# Patient Record
Sex: Female | Born: 1945 | Race: White | Hispanic: No | Marital: Married | Smoking: Former smoker
Health system: Southern US, Community
[De-identification: ages and names within clinical notes are randomized; demographics above are authoritative.]

## PROBLEM LIST (undated history)

## (undated) DIAGNOSIS — Z9221 Personal history of antineoplastic chemotherapy: Secondary | ICD-10-CM

## (undated) DIAGNOSIS — Z803 Family history of malignant neoplasm of breast: Secondary | ICD-10-CM

## (undated) DIAGNOSIS — R569 Unspecified convulsions: Secondary | ICD-10-CM

## (undated) DIAGNOSIS — Z9889 Other specified postprocedural states: Secondary | ICD-10-CM

## (undated) DIAGNOSIS — Z8041 Family history of malignant neoplasm of ovary: Secondary | ICD-10-CM

## (undated) DIAGNOSIS — M419 Scoliosis, unspecified: Secondary | ICD-10-CM

## (undated) DIAGNOSIS — E785 Hyperlipidemia, unspecified: Secondary | ICD-10-CM

## (undated) DIAGNOSIS — C50911 Malignant neoplasm of unspecified site of right female breast: Secondary | ICD-10-CM

## (undated) DIAGNOSIS — Z9289 Personal history of other medical treatment: Secondary | ICD-10-CM

## (undated) DIAGNOSIS — R112 Nausea with vomiting, unspecified: Secondary | ICD-10-CM

## (undated) DIAGNOSIS — I499 Cardiac arrhythmia, unspecified: Secondary | ICD-10-CM

## (undated) DIAGNOSIS — M810 Age-related osteoporosis without current pathological fracture: Secondary | ICD-10-CM

## (undated) DIAGNOSIS — IMO0001 Reserved for inherently not codable concepts without codable children: Secondary | ICD-10-CM

## (undated) DIAGNOSIS — D72829 Elevated white blood cell count, unspecified: Secondary | ICD-10-CM

## (undated) DIAGNOSIS — C50919 Malignant neoplasm of unspecified site of unspecified female breast: Secondary | ICD-10-CM

## (undated) DIAGNOSIS — K219 Gastro-esophageal reflux disease without esophagitis: Secondary | ICD-10-CM

## (undated) DIAGNOSIS — M1990 Unspecified osteoarthritis, unspecified site: Secondary | ICD-10-CM

## (undated) DIAGNOSIS — M8580 Other specified disorders of bone density and structure, unspecified site: Secondary | ICD-10-CM

## (undated) DIAGNOSIS — M89669 Osteopathy after poliomyelitis, unspecified lower leg: Secondary | ICD-10-CM

## (undated) DIAGNOSIS — D72820 Lymphocytosis (symptomatic): Secondary | ICD-10-CM

## (undated) DIAGNOSIS — C50319 Malignant neoplasm of lower-inner quadrant of unspecified female breast: Secondary | ICD-10-CM

## (undated) DIAGNOSIS — A809 Acute poliomyelitis, unspecified: Secondary | ICD-10-CM

## (undated) HISTORY — PX: TUBAL LIGATION: SHX77

## (undated) HISTORY — DX: Malignant neoplasm of lower-inner quadrant of unspecified female breast: C50.319

## (undated) HISTORY — DX: Lymphocytosis (symptomatic): D72.820

## (undated) HISTORY — PX: SPINE SURGERY: SHX786

## (undated) HISTORY — DX: Osteopathy after poliomyelitis, unspecified lower leg: M89.669

## (undated) HISTORY — DX: Elevated white blood cell count, unspecified: D72.829

## (undated) HISTORY — DX: Family history of malignant neoplasm of ovary: Z80.41

## (undated) HISTORY — DX: Gastro-esophageal reflux disease without esophagitis: K21.9

## (undated) HISTORY — DX: Other specified disorders of bone density and structure, unspecified site: M85.80

## (undated) HISTORY — DX: Hyperlipidemia, unspecified: E78.5

## (undated) HISTORY — PX: BACK SURGERY: SHX140

## (undated) HISTORY — DX: Malignant neoplasm of unspecified site of right female breast: C50.911

## (undated) HISTORY — PX: MASTECTOMY: SHX3

## (undated) HISTORY — DX: Unspecified osteoarthritis, unspecified site: M19.90

## (undated) HISTORY — DX: Family history of malignant neoplasm of breast: Z80.3

## (undated) HISTORY — PX: BREAST BIOPSY: SHX20

## (undated) HISTORY — PX: LEG SURGERY: SHX1003

---

## 1983-08-10 HISTORY — PX: ABDOMINAL HYSTERECTOMY: SHX81

## 1986-10-19 HISTORY — PX: FOOT SURGERY: SHX648

## 2004-09-22 ENCOUNTER — Ambulatory Visit: Payer: Self-pay | Admitting: Family Medicine

## 2005-03-17 ENCOUNTER — Ambulatory Visit: Payer: Self-pay | Admitting: Family Medicine

## 2005-04-28 ENCOUNTER — Ambulatory Visit: Payer: Self-pay | Admitting: Gastroenterology

## 2005-12-22 ENCOUNTER — Ambulatory Visit: Payer: Self-pay | Admitting: Gastroenterology

## 2006-03-25 ENCOUNTER — Ambulatory Visit: Payer: Self-pay | Admitting: Family Medicine

## 2007-03-29 ENCOUNTER — Ambulatory Visit: Payer: Self-pay | Admitting: Family Medicine

## 2007-03-30 ENCOUNTER — Ambulatory Visit: Payer: Self-pay | Admitting: Family Medicine

## 2007-04-25 ENCOUNTER — Ambulatory Visit: Payer: Self-pay | Admitting: General Surgery

## 2007-09-23 ENCOUNTER — Ambulatory Visit: Payer: Self-pay | Admitting: General Surgery

## 2008-04-18 ENCOUNTER — Ambulatory Visit: Payer: Self-pay | Admitting: Family Medicine

## 2008-05-01 ENCOUNTER — Ambulatory Visit: Payer: Self-pay | Admitting: Family Medicine

## 2009-05-20 ENCOUNTER — Ambulatory Visit: Payer: Self-pay | Admitting: Family Medicine

## 2009-10-19 HISTORY — PX: COLONOSCOPY: SHX174

## 2009-11-26 ENCOUNTER — Ambulatory Visit: Payer: Self-pay | Admitting: Family Medicine

## 2009-12-30 ENCOUNTER — Encounter: Payer: Self-pay | Admitting: Neurology

## 2010-01-17 ENCOUNTER — Encounter: Payer: Self-pay | Admitting: Neurology

## 2010-05-23 ENCOUNTER — Ambulatory Visit: Payer: Self-pay | Admitting: Family Medicine

## 2010-06-02 ENCOUNTER — Ambulatory Visit: Payer: Self-pay | Admitting: Family Medicine

## 2010-06-11 ENCOUNTER — Ambulatory Visit: Payer: Self-pay | Admitting: Gastroenterology

## 2010-06-18 ENCOUNTER — Ambulatory Visit: Payer: Self-pay | Admitting: Gastroenterology

## 2010-07-01 ENCOUNTER — Ambulatory Visit: Payer: Self-pay | Admitting: Family Medicine

## 2011-04-19 ENCOUNTER — Ambulatory Visit: Payer: Self-pay | Admitting: Internal Medicine

## 2011-04-27 ENCOUNTER — Emergency Department: Payer: Self-pay | Admitting: Emergency Medicine

## 2011-05-22 ENCOUNTER — Ambulatory Visit: Payer: Self-pay | Admitting: Internal Medicine

## 2011-06-20 ENCOUNTER — Ambulatory Visit: Payer: Self-pay | Admitting: Internal Medicine

## 2011-07-08 ENCOUNTER — Ambulatory Visit: Payer: Self-pay | Admitting: Family Medicine

## 2011-09-14 ENCOUNTER — Ambulatory Visit: Payer: Self-pay | Admitting: Internal Medicine

## 2011-09-19 ENCOUNTER — Ambulatory Visit: Payer: Self-pay | Admitting: Internal Medicine

## 2012-01-06 ENCOUNTER — Ambulatory Visit: Payer: Self-pay | Admitting: Internal Medicine

## 2012-01-06 LAB — CBC CANCER CENTER
Basophil %: 0.7 %
Eosinophil #: 0.4 x10 3/mm (ref 0.0–0.7)
Eosinophil %: 4.2 %
HGB: 12.3 g/dL (ref 12.0–16.0)
Lymphocyte %: 50.1 %
MCH: 30.4 pg (ref 26.0–34.0)
MCHC: 33.2 g/dL (ref 32.0–36.0)
MCV: 92 fL (ref 80–100)
Monocyte #: 0.8 x10 3/mm — ABNORMAL HIGH (ref 0.0–0.7)
Neutrophil #: 4 x10 3/mm (ref 1.4–6.5)
Neutrophil %: 37.5 %
RBC: 4.05 10*6/uL (ref 3.80–5.20)
WBC: 10.6 x10 3/mm (ref 3.6–11.0)

## 2012-01-06 LAB — CREATININE, SERUM
Creatinine: 0.71 mg/dL (ref 0.60–1.30)
EGFR (Non-African Amer.): >60

## 2012-01-18 ENCOUNTER — Ambulatory Visit: Payer: Self-pay | Admitting: Internal Medicine

## 2012-06-03 ENCOUNTER — Ambulatory Visit: Payer: Self-pay | Admitting: Internal Medicine

## 2012-06-03 LAB — CBC CANCER CENTER
Basophil: 1 %
Comment - H1-Com1: NORMAL
Eosinophil: 2 %
HCT: 37 % (ref 35.0–47.0)
HGB: 11.9 g/dL — ABNORMAL LOW (ref 12.0–16.0)
Lymphocytes: 53 %
MCV: 92 fL (ref 80–100)
Monocytes: 3 %
RDW: 13.5 % (ref 11.5–14.5)
Segmented Neutrophils: 34 %
Variant Lymphocyte: 6 %
WBC: 10.9 x10 3/mm (ref 3.6–11.0)

## 2012-06-19 ENCOUNTER — Ambulatory Visit: Payer: Self-pay | Admitting: Internal Medicine

## 2012-07-08 ENCOUNTER — Ambulatory Visit: Payer: Self-pay | Admitting: Family Medicine

## 2012-09-18 DIAGNOSIS — C50911 Malignant neoplasm of unspecified site of right female breast: Secondary | ICD-10-CM

## 2012-09-18 HISTORY — DX: Malignant neoplasm of unspecified site of right female breast: C50.911

## 2012-09-29 ENCOUNTER — Ambulatory Visit: Payer: Self-pay | Admitting: General Surgery

## 2012-10-03 ENCOUNTER — Other Ambulatory Visit: Payer: Self-pay | Admitting: General Surgery

## 2012-10-03 DIAGNOSIS — C50911 Malignant neoplasm of unspecified site of right female breast: Secondary | ICD-10-CM

## 2012-10-04 ENCOUNTER — Ambulatory Visit: Payer: Self-pay | Admitting: General Surgery

## 2012-10-17 ENCOUNTER — Ambulatory Visit
Admission: RE | Admit: 2012-10-17 | Discharge: 2012-10-17 | Disposition: A | Discharge status: Home / Self Care | Payer: Medicare Other | Source: Ambulatory Visit | Attending: General Surgery | Admitting: General Surgery

## 2012-10-17 DIAGNOSIS — C50911 Malignant neoplasm of unspecified site of right female breast: Secondary | ICD-10-CM

## 2012-10-17 MED ORDER — GADOBENATE DIMEGLUMINE 529 MG/ML IV SOLN
17.0000 mL | Freq: Once | INTRAVENOUS | Status: AC | PRN
  Administered 2012-10-17: 17 mL via INTRAVENOUS

## 2012-10-19 HISTORY — PX: PORTACATH PLACEMENT: SHX2246

## 2012-10-19 HISTORY — PX: BREAST SURGERY: SHX581

## 2012-10-27 ENCOUNTER — Ambulatory Visit: Payer: Self-pay | Admitting: General Surgery

## 2012-11-03 ENCOUNTER — Ambulatory Visit: Payer: Self-pay | Admitting: General Surgery

## 2012-11-09 LAB — PATHOLOGY REPORT

## 2012-11-28 ENCOUNTER — Ambulatory Visit: Payer: Self-pay | Admitting: Internal Medicine

## 2012-12-12 ENCOUNTER — Ambulatory Visit: Payer: Self-pay | Admitting: Internal Medicine

## 2012-12-17 ENCOUNTER — Ambulatory Visit: Payer: Self-pay | Admitting: Internal Medicine

## 2012-12-22 ENCOUNTER — Ambulatory Visit: Payer: Self-pay | Admitting: General Surgery

## 2012-12-26 LAB — COMPREHENSIVE METABOLIC PANEL
BUN: 12 mg/dL (ref 7–18)
Chloride: 102 mmol/L (ref 98–107)
Co2: 29 mmol/L (ref 21–32)
Creatinine: 0.78 mg/dL (ref 0.60–1.30)
EGFR (African American): >60
Glucose: 118 mg/dL — ABNORMAL HIGH (ref 65–99)
Osmolality: 280 (ref 275–301)
Potassium: 3.8 mmol/L (ref 3.5–5.1)
SGOT(AST): 22 U/L (ref 15–37)
SGPT (ALT): 22 U/L (ref 12–78)
Sodium: 140 mmol/L (ref 136–145)

## 2012-12-26 LAB — CBC CANCER CENTER
Eosinophil #: 0.4 x10 3/mm (ref 0.0–0.7)
HCT: 38.7 % (ref 35.0–47.0)
Lymphocyte #: 5.4 x10 3/mm — ABNORMAL HIGH (ref 1.0–3.6)
Lymphocyte %: 48.2 %
MCV: 90 fL (ref 80–100)
Monocyte %: 7.6 %
Neutrophil %: 39.5 %
Platelet: 317 x10 3/mm (ref 150–440)
RBC: 4.3 10*6/uL (ref 3.80–5.20)
RDW: 13.3 % (ref 11.5–14.5)
WBC: 11.2 x10 3/mm — ABNORMAL HIGH (ref 3.6–11.0)

## 2013-01-02 LAB — CBC CANCER CENTER
Basophil %: 1.3 %
Eosinophil #: 0.1 x10 3/mm (ref 0.0–0.7)
Eosinophil %: 7.3 %
Lymphocyte #: 1.8 x10 3/mm (ref 1.0–3.6)
MCH: 30.1 pg (ref 26.0–34.0)
MCHC: 33.5 g/dL (ref 32.0–36.0)
MCV: 90 fL (ref 80–100)
Monocyte #: 0 x10 3/mm — ABNORMAL LOW (ref 0.2–0.9)
Neutrophil #: 0 x10 3/mm — ABNORMAL LOW (ref 1.4–6.5)
Platelet: 115 x10 3/mm — ABNORMAL LOW (ref 150–440)
RBC: 4.02 10*6/uL (ref 3.80–5.20)
WBC: 2 x10 3/mm — CL (ref 3.6–11.0)

## 2013-01-09 LAB — HEPATIC FUNCTION PANEL A (ARMC)
Albumin: 2.9 g/dL — ABNORMAL LOW (ref 3.4–5.0)
Alkaline Phosphatase: 87 U/L (ref 50–136)
Bilirubin, Direct: <0.05 mg/dL (ref 0.00–0.20)
SGOT(AST): 20 U/L (ref 15–37)
Total Protein: 6.6 g/dL (ref 6.4–8.2)

## 2013-01-09 LAB — CBC CANCER CENTER
Basophil #: 0.1 x10 3/mm (ref 0.0–0.1)
Eosinophil #: 0 x10 3/mm (ref 0.0–0.7)
Eosinophil %: 0.2 %
HCT: 34.3 % — ABNORMAL LOW (ref 35.0–47.0)
Lymphocyte #: 4.4 x10 3/mm — ABNORMAL HIGH (ref 1.0–3.6)
MCH: 29.9 pg (ref 26.0–34.0)
Monocyte %: 12.1 %
Neutrophil %: 59.2 %
RDW: 13.1 % (ref 11.5–14.5)
WBC: 15.5 x10 3/mm — ABNORMAL HIGH (ref 3.6–11.0)

## 2013-01-09 LAB — BASIC METABOLIC PANEL
Anion Gap: 5 — ABNORMAL LOW (ref 7–16)
Co2: 30 mmol/L (ref 21–32)
Creatinine: 0.71 mg/dL (ref 0.60–1.30)
Sodium: 139 mmol/L (ref 136–145)

## 2013-01-16 LAB — CBC CANCER CENTER
Basophil #: 0.2 x10 3/mm — ABNORMAL HIGH (ref 0.0–0.1)
Eosinophil #: 0 x10 3/mm (ref 0.0–0.7)
Eosinophil %: 0.2 %
HCT: 34.2 % — ABNORMAL LOW (ref 35.0–47.0)
HGB: 11.6 g/dL — ABNORMAL LOW (ref 12.0–16.0)
Lymphocyte #: 3.2 x10 3/mm (ref 1.0–3.6)
Lymphocyte %: 33.9 %
MCHC: 33.9 g/dL (ref 32.0–36.0)
MCV: 89 fL (ref 80–100)
Monocyte #: 1.2 x10 3/mm — ABNORMAL HIGH (ref 0.2–0.9)
Monocyte %: 12.9 %
Neutrophil #: 4.9 x10 3/mm (ref 1.4–6.5)
RBC: 3.85 10*6/uL (ref 3.80–5.20)

## 2013-01-17 ENCOUNTER — Ambulatory Visit: Payer: Self-pay | Admitting: Internal Medicine

## 2013-01-23 LAB — CBC CANCER CENTER
Basophil #: 0 x10 3/mm (ref 0.0–0.1)
Basophil %: 0.1 %
Eosinophil #: 0.1 x10 3/mm (ref 0.0–0.7)
Eosinophil %: 4.5 %
HCT: 31.4 % — ABNORMAL LOW (ref 35.0–47.0)
HGB: 10.4 g/dL — ABNORMAL LOW (ref 12.0–16.0)
Lymphocyte #: 1.3 x10 3/mm (ref 1.0–3.6)
Lymphocyte %: 62.1 %
MCH: 29.6 pg (ref 26.0–34.0)
MCHC: 33.2 g/dL (ref 32.0–36.0)
MCV: 89 fL (ref 80–100)
Monocyte #: 0.1 x10 3/mm — ABNORMAL LOW (ref 0.2–0.9)
Monocyte %: 3.4 %
Platelet: 243 x10 3/mm (ref 150–440)
RBC: 3.51 10*6/uL — ABNORMAL LOW (ref 3.80–5.20)
RDW: 13.4 % (ref 11.5–14.5)

## 2013-01-30 LAB — CBC CANCER CENTER
Basophil #: 0.1 x10 3/mm (ref 0.0–0.1)
HCT: 31 % — ABNORMAL LOW (ref 35.0–47.0)
HGB: 10.3 g/dL — ABNORMAL LOW (ref 12.0–16.0)
Lymphocyte #: 3.3 x10 3/mm (ref 1.0–3.6)
Lymphocyte %: 36.1 %
MCHC: 33.3 g/dL (ref 32.0–36.0)
MCV: 90 fL (ref 80–100)
Monocyte #: 1.4 x10 3/mm — ABNORMAL HIGH (ref 0.2–0.9)
Monocyte %: 15.3 %
Neutrophil #: 3.9 x10 3/mm (ref 1.4–6.5)
Neutrophil %: 42.7 %
RBC: 3.47 10*6/uL — ABNORMAL LOW (ref 3.80–5.20)

## 2013-01-30 LAB — HEPATIC FUNCTION PANEL A (ARMC)
Albumin: 2.8 g/dL — ABNORMAL LOW (ref 3.4–5.0)
Alkaline Phosphatase: 88 U/L (ref 50–136)
Bilirubin, Direct: <0.05 mg/dL (ref 0.00–0.20)
Bilirubin,Total: 0.1 mg/dL — ABNORMAL LOW (ref 0.2–1.0)
SGPT (ALT): 21 U/L (ref 12–78)

## 2013-01-30 LAB — BASIC METABOLIC PANEL
BUN: 5 mg/dL — ABNORMAL LOW (ref 7–18)
Calcium, Total: 8.9 mg/dL (ref 8.5–10.1)
Co2: 32 mmol/L (ref 21–32)
Creatinine: 0.69 mg/dL (ref 0.60–1.30)
EGFR (African American): >60
EGFR (Non-African Amer.): >60
Osmolality: 282 (ref 275–301)
Potassium: 4.2 mmol/L (ref 3.5–5.1)

## 2013-02-06 LAB — CBC CANCER CENTER
Basophil #: 0.1 x10 3/mm (ref 0.0–0.1)
HCT: 30.7 % — ABNORMAL LOW (ref 35.0–47.0)
MCHC: 33.8 g/dL (ref 32.0–36.0)
Monocyte %: 21.2 %
Neutrophil #: 3 x10 3/mm (ref 1.4–6.5)
Platelet: 365 x10 3/mm (ref 150–440)
RBC: 3.41 10*6/uL — ABNORMAL LOW (ref 3.80–5.20)
RDW: 14 % (ref 11.5–14.5)

## 2013-02-13 LAB — CBC CANCER CENTER
Basophil %: 3.9 %
Eosinophil #: 0.1 x10 3/mm (ref 0.0–0.7)
HGB: 9.1 g/dL — ABNORMAL LOW (ref 12.0–16.0)
Lymphocyte #: 1 x10 3/mm (ref 1.0–3.6)
MCHC: 33.2 g/dL (ref 32.0–36.0)
Monocyte %: 4 %
Neutrophil #: 0.2 x10 3/mm — ABNORMAL LOW (ref 1.4–6.5)
Neutrophil %: 17.2 %
RBC: 3.04 10*6/uL — ABNORMAL LOW (ref 3.80–5.20)

## 2013-02-16 ENCOUNTER — Ambulatory Visit: Payer: Self-pay | Admitting: Internal Medicine

## 2013-02-20 LAB — HEPATIC FUNCTION PANEL A (ARMC)
Alkaline Phosphatase: 91 U/L (ref 50–136)
Bilirubin,Total: 0.2 mg/dL (ref 0.2–1.0)
SGOT(AST): 19 U/L (ref 15–37)
Total Protein: 6.2 g/dL — ABNORMAL LOW (ref 6.4–8.2)

## 2013-02-20 LAB — CBC CANCER CENTER
Basophil #: 0 x10 3/mm (ref 0.0–0.1)
Basophil %: 0.7 %
HCT: 26.5 % — ABNORMAL LOW (ref 35.0–47.0)
HGB: 8.8 g/dL — ABNORMAL LOW (ref 12.0–16.0)
Lymphocyte %: 34.9 %
MCH: 30.1 pg (ref 26.0–34.0)
MCHC: 33.2 g/dL (ref 32.0–36.0)
MCV: 91 fL (ref 80–100)
Monocyte #: 1 x10 3/mm — ABNORMAL HIGH (ref 0.2–0.9)
Monocyte %: 19.4 %
Platelet: 153 x10 3/mm (ref 150–440)
RBC: 2.92 10*6/uL — ABNORMAL LOW (ref 3.80–5.20)
WBC: 5.1 x10 3/mm (ref 3.6–11.0)

## 2013-02-20 LAB — BASIC METABOLIC PANEL
BUN: 4 mg/dL — ABNORMAL LOW (ref 7–18)
Calcium, Total: 8.9 mg/dL (ref 8.5–10.1)
Chloride: 106 mmol/L (ref 98–107)
Co2: 29 mmol/L (ref 21–32)
EGFR (African American): >60
EGFR (Non-African Amer.): >60
Sodium: 143 mmol/L (ref 136–145)

## 2013-02-27 LAB — CBC CANCER CENTER
Basophil #: 0.1 x10 3/mm (ref 0.0–0.1)
Basophil %: 2.5 %
Eosinophil #: 0.1 x10 3/mm (ref 0.0–0.7)
Eosinophil %: 1.6 %
HCT: 27 % — ABNORMAL LOW (ref 35.0–47.0)
HGB: 9.1 g/dL — ABNORMAL LOW (ref 12.0–16.0)
Lymphocyte #: 1.9 x10 3/mm (ref 1.0–3.6)
Lymphocyte %: 32.8 %
MCH: 30.5 pg (ref 26.0–34.0)
MCHC: 33.6 g/dL (ref 32.0–36.0)
MCV: 91 fL (ref 80–100)
Monocyte #: 1.4 x10 3/mm — ABNORMAL HIGH (ref 0.2–0.9)
Monocyte %: 25 %
Neutrophil #: 2.2 x10 3/mm (ref 1.4–6.5)
Neutrophil %: 38.1 %
Platelet: 367 x10 3/mm (ref 150–440)
RBC: 2.98 10*6/uL — ABNORMAL LOW (ref 3.80–5.20)
RDW: 17.1 % — ABNORMAL HIGH (ref 11.5–14.5)
WBC: 5.8 x10 3/mm (ref 3.6–11.0)

## 2013-03-06 LAB — CBC CANCER CENTER
Basophil #: 0 x10 3/mm (ref 0.0–0.1)
Eosinophil #: 0.1 x10 3/mm (ref 0.0–0.7)
HCT: 23.7 % — ABNORMAL LOW (ref 35.0–47.0)
HGB: 8.1 g/dL — ABNORMAL LOW (ref 12.0–16.0)
MCHC: 34 g/dL (ref 32.0–36.0)
Monocyte %: 4.5 %
Neutrophil #: 0 x10 3/mm — ABNORMAL LOW (ref 1.4–6.5)
Neutrophil %: 4.6 %
Platelet: 55 x10 3/mm — ABNORMAL LOW (ref 150–440)
RBC: 2.57 10*6/uL — ABNORMAL LOW (ref 3.80–5.20)
RDW: 17.2 % — ABNORMAL HIGH (ref 11.5–14.5)

## 2013-03-14 LAB — BASIC METABOLIC PANEL
BUN: 4 mg/dL — ABNORMAL LOW (ref 7–18)
Calcium, Total: 9.2 mg/dL (ref 8.5–10.1)
Chloride: 104 mmol/L (ref 98–107)
Co2: 29 mmol/L (ref 21–32)
Creatinine: 0.74 mg/dL (ref 0.60–1.30)
EGFR (African American): >60
Osmolality: 281 (ref 275–301)
Potassium: 3.5 mmol/L (ref 3.5–5.1)

## 2013-03-14 LAB — CBC CANCER CENTER
Basophil %: 0.3 %
HCT: 23.3 % — ABNORMAL LOW (ref 35.0–47.0)
Lymphocyte %: 23.6 %
MCH: 31.2 pg (ref 26.0–34.0)
MCV: 93 fL (ref 80–100)
Monocyte #: 1.5 x10 3/mm — ABNORMAL HIGH (ref 0.2–0.9)
Monocyte %: 18.9 %
Platelet: 151 x10 3/mm (ref 150–440)
RBC: 2.51 10*6/uL — ABNORMAL LOW (ref 3.80–5.20)
RDW: 16.9 % — ABNORMAL HIGH (ref 11.5–14.5)

## 2013-03-14 LAB — HEPATIC FUNCTION PANEL A (ARMC)
Albumin: 2.8 g/dL — ABNORMAL LOW (ref 3.4–5.0)
Bilirubin,Total: 0.2 mg/dL (ref 0.2–1.0)
SGPT (ALT): 20 U/L (ref 12–78)

## 2013-03-19 ENCOUNTER — Ambulatory Visit: Payer: Self-pay | Admitting: Internal Medicine

## 2013-03-20 LAB — CBC CANCER CENTER
Basophil #: 0 x10 3/mm (ref 0.0–0.1)
Basophil %: 0.2 %
HGB: 8.5 g/dL — ABNORMAL LOW (ref 12.0–16.0)
Lymphocyte #: 1 x10 3/mm (ref 1.0–3.6)
MCH: 32.1 pg (ref 26.0–34.0)
MCHC: 33.9 g/dL (ref 32.0–36.0)
MCV: 95 fL (ref 80–100)
Monocyte #: 0.3 x10 3/mm (ref 0.2–0.9)
Neutrophil #: 5.7 x10 3/mm (ref 1.4–6.5)
Neutrophil %: 81.6 %
RBC: 2.64 10*6/uL — ABNORMAL LOW (ref 3.80–5.20)

## 2013-03-27 LAB — CBC CANCER CENTER
Basophil #: 0.1 x10 3/mm (ref 0.0–0.1)
Eosinophil #: 0.2 x10 3/mm (ref 0.0–0.7)
HCT: 22.4 % — ABNORMAL LOW (ref 35.0–47.0)
HGB: 7.8 g/dL — ABNORMAL LOW (ref 12.0–16.0)
Lymphocyte #: 2.6 x10 3/mm (ref 1.0–3.6)
MCHC: 34.7 g/dL (ref 32.0–36.0)
MCV: 94 fL (ref 80–100)
Monocyte #: 0.6 x10 3/mm (ref 0.2–0.9)
Monocyte %: 12 %
Platelet: 273 x10 3/mm (ref 150–440)
RBC: 2.37 10*6/uL — ABNORMAL LOW (ref 3.80–5.20)
WBC: 5.3 x10 3/mm (ref 3.6–11.0)

## 2013-04-03 LAB — CBC CANCER CENTER
Basophil #: 0.1 x10 3/mm (ref 0.0–0.1)
Eosinophil %: 11.2 %
MCH: 33.9 pg (ref 26.0–34.0)
MCHC: 34.7 g/dL (ref 32.0–36.0)
MCV: 98 fL (ref 80–100)
Monocyte #: 0.5 x10 3/mm (ref 0.2–0.9)
Monocyte %: 12.5 %
Neutrophil #: 1.5 x10 3/mm (ref 1.4–6.5)
Neutrophil %: 35.1 %
Platelet: 254 x10 3/mm (ref 150–440)
WBC: 4.3 x10 3/mm (ref 3.6–11.0)

## 2013-04-10 LAB — CBC CANCER CENTER
Basophil #: 0.1 x10 3/mm (ref 0.0–0.1)
Eosinophil #: 0.4 x10 3/mm (ref 0.0–0.7)
HCT: 22.2 % — ABNORMAL LOW (ref 35.0–47.0)
Lymphocyte #: 1.8 x10 3/mm (ref 1.0–3.6)
Lymphocyte %: 41.6 %
MCH: 34.7 pg — ABNORMAL HIGH (ref 26.0–34.0)
MCHC: 34.8 g/dL (ref 32.0–36.0)
Monocyte %: 10.4 %
Neutrophil #: 1.6 x10 3/mm (ref 1.4–6.5)
Neutrophil %: 36.2 %
Platelet: 270 x10 3/mm (ref 150–440)
RBC: 2.23 10*6/uL — ABNORMAL LOW (ref 3.80–5.20)

## 2013-04-17 LAB — CBC CANCER CENTER
Basophil %: 2 %
HCT: 26.6 % — ABNORMAL LOW (ref 35.0–47.0)
HGB: 9.3 g/dL — ABNORMAL LOW (ref 12.0–16.0)
Lymphocyte %: 46.7 %
MCHC: 35 g/dL (ref 32.0–36.0)
MCV: 100 fL (ref 80–100)
Monocyte %: 11.5 %
Neutrophil #: 1.3 x10 3/mm — ABNORMAL LOW (ref 1.4–6.5)
Neutrophil %: 33.2 %
RDW: 16.4 % — ABNORMAL HIGH (ref 11.5–14.5)
WBC: 3.9 x10 3/mm (ref 3.6–11.0)

## 2013-04-18 ENCOUNTER — Ambulatory Visit: Payer: Self-pay | Admitting: Internal Medicine

## 2013-04-24 LAB — BASIC METABOLIC PANEL
BUN: 6 mg/dL — ABNORMAL LOW (ref 7–18)
Chloride: 105 mmol/L (ref 98–107)
Co2: 27 mmol/L (ref 21–32)
Creatinine: 0.61 mg/dL (ref 0.60–1.30)
EGFR (African American): >60
EGFR (Non-African Amer.): >60
Glucose: 110 mg/dL — ABNORMAL HIGH (ref 65–99)
Potassium: 3.7 mmol/L (ref 3.5–5.1)
Sodium: 139 mmol/L (ref 136–145)

## 2013-04-24 LAB — CBC CANCER CENTER
Basophil #: 0.1 x10 3/mm (ref 0.0–0.1)
Eosinophil %: 4.1 %
HCT: 27.2 % — ABNORMAL LOW (ref 35.0–47.0)
HGB: 9.3 g/dL — ABNORMAL LOW (ref 12.0–16.0)
Lymphocyte #: 1.7 x10 3/mm (ref 1.0–3.6)
MCH: 34.2 pg — ABNORMAL HIGH (ref 26.0–34.0)
MCHC: 34 g/dL (ref 32.0–36.0)
MCV: 100 fL (ref 80–100)
Neutrophil #: 1.7 x10 3/mm (ref 1.4–6.5)
Neutrophil %: 42 %
Platelet: 243 x10 3/mm (ref 150–440)
RDW: 16.3 % — ABNORMAL HIGH (ref 11.5–14.5)
WBC: 4.1 x10 3/mm (ref 3.6–11.0)

## 2013-04-24 LAB — HEPATIC FUNCTION PANEL A (ARMC)
Albumin: 2.9 g/dL — ABNORMAL LOW
Alkaline Phosphatase: 80 U/L
Bilirubin, Direct: 0.1 mg/dL
Bilirubin,Total: 0.3 mg/dL
SGOT(AST): 31 U/L
SGPT (ALT): 35 U/L
Total Protein: 6.3 g/dL — ABNORMAL LOW

## 2013-05-01 LAB — CBC CANCER CENTER
Basophil #: 0.1 x10 3/mm (ref 0.0–0.1)
Basophil %: 1.8 %
Eosinophil #: 0.2 x10 3/mm (ref 0.0–0.7)
Eosinophil %: 4.7 %
HCT: 25.6 % — ABNORMAL LOW (ref 35.0–47.0)
HGB: 9 g/dL — ABNORMAL LOW (ref 12.0–16.0)
Lymphocyte #: 1.7 x10 3/mm (ref 1.0–3.6)
Lymphocyte %: 45.6 %
MCH: 35 pg — ABNORMAL HIGH (ref 26.0–34.0)
MCHC: 35.3 g/dL (ref 32.0–36.0)
MCV: 99 fL (ref 80–100)
Monocyte #: 0.4 x10 3/mm (ref 0.2–0.9)
Monocyte %: 9.6 %
Neutrophil #: 1.4 x10 3/mm (ref 1.4–6.5)
Neutrophil %: 38.3 %
RBC: 2.58 10*6/uL — ABNORMAL LOW (ref 3.80–5.20)
WBC: 3.7 x10 3/mm (ref 3.6–11.0)

## 2013-05-08 LAB — CBC CANCER CENTER
Basophil %: 1.7 %
Eosinophil #: 0.1 x10 3/mm (ref 0.0–0.7)
Eosinophil %: 2.8 %
HCT: 25.5 % — ABNORMAL LOW (ref 35.0–47.0)
HGB: 8.9 g/dL — ABNORMAL LOW (ref 12.0–16.0)
Lymphocyte #: 2.1 x10 3/mm (ref 1.0–3.6)
MCHC: 34.8 g/dL (ref 32.0–36.0)
Monocyte #: 0.8 x10 3/mm (ref 0.2–0.9)
Monocyte %: 16.3 %
Neutrophil #: 1.7 x10 3/mm (ref 1.4–6.5)
Platelet: 277 x10 3/mm (ref 150–440)
RBC: 2.56 10*6/uL — ABNORMAL LOW (ref 3.80–5.20)
RDW: 16 % — ABNORMAL HIGH (ref 11.5–14.5)
WBC: 4.8 x10 3/mm (ref 3.6–11.0)

## 2013-05-08 LAB — COMPREHENSIVE METABOLIC PANEL WITH GFR
Albumin: 2.7 g/dL — ABNORMAL LOW
Alkaline Phosphatase: 76 U/L
Anion Gap: 3 — ABNORMAL LOW
BUN: 6 mg/dL — ABNORMAL LOW
Bilirubin,Total: 0.2 mg/dL
Calcium, Total: 8.6 mg/dL
Chloride: 107 mmol/L
Co2: 29 mmol/L
Creatinine: 0.54 mg/dL — ABNORMAL LOW
EGFR (African American): >60
EGFR (Non-African Amer.): >60
Glucose: 110 mg/dL — ABNORMAL HIGH
Osmolality: 276
Potassium: 3.7 mmol/L
SGOT(AST): 24 U/L
SGPT (ALT): 23 U/L
Sodium: 139 mmol/L
Total Protein: 6.1 g/dL — ABNORMAL LOW

## 2013-05-15 LAB — BASIC METABOLIC PANEL
Calcium, Total: 9.2 mg/dL (ref 8.5–10.1)
Co2: 29 mmol/L (ref 21–32)
Creatinine: 0.51 mg/dL — ABNORMAL LOW (ref 0.60–1.30)
EGFR (African American): >60
EGFR (Non-African Amer.): >60
Glucose: 95 mg/dL (ref 65–99)
Osmolality: 277 (ref 275–301)
Potassium: 3.6 mmol/L (ref 3.5–5.1)

## 2013-05-15 LAB — CBC CANCER CENTER
Basophil #: 0.1 x10 3/mm (ref 0.0–0.1)
Basophil %: 1.1 %
Eosinophil #: 0.3 x10 3/mm (ref 0.0–0.7)
HGB: 8.8 g/dL — ABNORMAL LOW (ref 12.0–16.0)
Lymphocyte #: 2.6 x10 3/mm (ref 1.0–3.6)
MCH: 35 pg — ABNORMAL HIGH (ref 26.0–34.0)
MCV: 100 fL (ref 80–100)
Monocyte #: 0.5 x10 3/mm (ref 0.2–0.9)
RDW: 15.5 % — ABNORMAL HIGH (ref 11.5–14.5)
WBC: 6.2 x10 3/mm (ref 3.6–11.0)

## 2013-05-19 ENCOUNTER — Ambulatory Visit: Payer: Self-pay | Admitting: Internal Medicine

## 2013-06-26 ENCOUNTER — Ambulatory Visit: Payer: Self-pay | Admitting: Internal Medicine

## 2013-07-19 ENCOUNTER — Ambulatory Visit: Payer: Self-pay | Admitting: Internal Medicine

## 2013-08-07 LAB — CREATININE, SERUM: EGFR (African American): >60

## 2013-08-07 LAB — CBC CANCER CENTER
Basophil %: 0.8 %
HCT: 33.2 % — ABNORMAL LOW (ref 35.0–47.0)
HGB: 11.1 g/dL — ABNORMAL LOW (ref 12.0–16.0)
Lymphocyte #: 3.3 x10 3/mm (ref 1.0–3.6)
MCHC: 33.6 g/dL (ref 32.0–36.0)
Monocyte %: 8.4 %
Neutrophil #: 3.2 x10 3/mm (ref 1.4–6.5)
Neutrophil %: 42.3 %
Platelet: 246 x10 3/mm (ref 150–440)
RBC: 3.45 10*6/uL — ABNORMAL LOW (ref 3.80–5.20)

## 2013-08-07 LAB — HEPATIC FUNCTION PANEL A (ARMC)
Albumin: 3 g/dL — ABNORMAL LOW (ref 3.4–5.0)
Bilirubin, Direct: 0.1 mg/dL (ref 0.00–0.20)
SGOT(AST): 25 U/L (ref 15–37)

## 2013-08-08 LAB — CANCER ANTIGEN 27.29: CA 27.29: 15 U/mL (ref 0.0–38.6)

## 2013-08-19 ENCOUNTER — Ambulatory Visit: Payer: Self-pay | Admitting: Internal Medicine

## 2013-09-18 ENCOUNTER — Ambulatory Visit: Payer: Self-pay | Admitting: Internal Medicine

## 2013-09-18 ENCOUNTER — Ambulatory Visit: Payer: Self-pay | Admitting: General Surgery

## 2013-09-18 ENCOUNTER — Encounter: Payer: Self-pay | Admitting: General Surgery

## 2013-09-25 ENCOUNTER — Encounter: Payer: Self-pay | Admitting: General Surgery

## 2013-09-25 ENCOUNTER — Ambulatory Visit: Payer: Self-pay | Admitting: General Surgery

## 2013-09-26 ENCOUNTER — Ambulatory Visit (INDEPENDENT_AMBULATORY_CARE_PROVIDER_SITE_OTHER): Payer: Medicare Other | Admitting: General Surgery

## 2013-09-26 ENCOUNTER — Encounter: Payer: Self-pay | Admitting: General Surgery

## 2013-09-26 VITALS — BP 152/82 | HR 86 | Resp 16 | Ht 63.0 in | Wt 160.0 lb

## 2013-09-26 DIAGNOSIS — Z853 Personal history of malignant neoplasm of breast: Secondary | ICD-10-CM

## 2013-09-26 NOTE — Progress Notes (Signed)
Patient ID: Erika Miller, female   DOB: 01/03/1946, 67 y.o.   MRN: [id number]  Chief Complaint  Patient presents with  . Follow-up    mammogram    HPI Erika Miller is a 67 y.o. female who presents for a breast evaluation. The most recent left breast mammogram was done on 09/18/13.  Patient does perform regular self breast checks and gets regular mammograms done.   HPI  Past Medical History  Diagnosis Date  . Cancer 09/2012    Right Breast, pT2,N0 (l+sn)  . 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 Arthritis   . 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 Leg surgery Bilateral 1957, 1958  . Foot surgery Right 1988  . Spine surgery  1959, 1960   . Breast biopsy Right 2008, 2013  . Breast surgery Right 10/2012    Mastectomy with s/n bx, and radiation therapy  . Colonoscopy  2011    Dr. Bluford Kaufmann, 2 benign polyps removed  . Abdominal hysterectomy  08/10/83    No family history on file.  Social History History  Substance Use Topics  . Smoking status: Former Smoker -- 0.50 packs/day    Types: Cigarettes    Quit date: 10/19/2002  . Smokeless tobacco: Never Used  . Alcohol Use: No    Allergies  Allergen Reactions  . Macrobid [Nitrofurantoin Macrocrystal] Rash    Current Outpatient Prescriptions  Medication Sig Dispense Refill  . anastrozole (ARIMIDEX) 1 MG tablet Take 1 tablet by mouth daily.      . Calcium Carbonate (CALCIUM 600 PO) Take 1 tablet by mouth daily.      . cetirizine (ZYRTEC) 10 MG tablet Take 10 mg by mouth daily.      . Cholecalciferol (VITAMIN D3) 2000 UNITS TABS Take 1 tablet by mouth daily.      Marland Kitchen gabapentin (NEURONTIN) 300 MG capsule Take 1 capsule by mouth 3 (three) times daily.      . Multiple Vitamin (MULTIVITAMIN) tablet Take 1 tablet by mouth daily.      . nicotine polacrilex (NICORETTE) 2 MG gum Take 2 mg by mouth as needed for smoking cessation.       . Ranitidine HCl (ACID CONTROL PO) Take 20 mg by mouth daily.       No current facility-administered medications for this visit.    Review of Systems Review of Systems  Constitutional: Negative.   Respiratory: Negative.   Cardiovascular: Negative.     Blood pressure 152/82, pulse 86, resp. rate 16, height 5\' 3"  (1.6 m), weight 160 lb (72.576 kg).  Physical Exam Physical Exam  Constitutional: She is oriented to person, place, and time. She appears well-developed and well-nourished.  Eyes: No scleral icterus.  Cardiovascular: Normal rate, regular rhythm and normal heart sounds.   Pulmonary/Chest: Breath sounds normal. Left breast exhibits no inverted nipple, no mass, no nipple discharge, no skin change and no tenderness.  Right shows no evidence of local recurrence. Redundant skin is noted at the anterior superior aspect of the axilla. No lymphadenopathy.  Abdominal: Soft. Bowel sounds are normal.  Lymphadenopathy:    She has no cervical adenopathy.       Left axillary: No pectoral and no lateral adenopathy present. Neurological: She is alert and oriented to person, place, and time.  Skin: Skin is warm and dry.    Data Reviewed Mammogram of the left  breast dated September 18, 2013 showed focal calcifications for which additional views were requested. BI-RAD-0.  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 No development of malignant type morphology or distribution is seen. BI-RAD-3.  Assessment    1) doing well status post right mastectomy.  2): Microcalcifications left breast for which closer will follow up is recommended.     Plan    Patient to return in six months left breast diagnotic mammogram.        Erika Miller 09/26/2013, 9:47 PM

## 2013-09-26 NOTE — Patient Instructions (Signed)
Patient to return in 6 months left breast diagnotic mammogram.

## 2013-10-09 ENCOUNTER — Ambulatory Visit: Payer: Self-pay | Admitting: Family Medicine

## 2013-10-19 ENCOUNTER — Ambulatory Visit: Payer: Self-pay | Admitting: Internal Medicine

## 2013-10-19 ENCOUNTER — Ambulatory Visit: Payer: Self-pay

## 2013-10-24 ENCOUNTER — Encounter: Payer: Self-pay | Admitting: General Surgery

## 2013-11-19 ENCOUNTER — Ambulatory Visit: Payer: Self-pay | Admitting: Internal Medicine

## 2013-12-11 LAB — CREATININE, SERUM
Creatinine: 0.51 mg/dL — ABNORMAL LOW (ref 0.60–1.30)
EGFR (African American): >60

## 2013-12-11 LAB — CBC CANCER CENTER
Basophil #: 0 x10 3/mm (ref 0.0–0.1)
Basophil %: 0.6 %
Eosinophil #: 0.3 x10 3/mm (ref 0.0–0.7)
Eosinophil %: 3.7 %
HCT: 34.4 % — ABNORMAL LOW (ref 35.0–47.0)
HGB: 11.3 g/dL — AB (ref 12.0–16.0)
LYMPHS ABS: 4.5 x10 3/mm — AB (ref 1.0–3.6)
Lymphocyte %: 56.9 %
MCH: 31 pg (ref 26.0–34.0)
MCHC: 32.9 g/dL (ref 32.0–36.0)
MCV: 94 fL (ref 80–100)
Monocyte #: 0.5 x10 3/mm (ref 0.2–0.9)
Monocyte %: 6.5 %
NEUTROS ABS: 2.6 x10 3/mm (ref 1.4–6.5)
Neutrophil %: 32.3 %
PLATELETS: 268 x10 3/mm (ref 150–440)
RBC: 3.66 10*6/uL — ABNORMAL LOW (ref 3.80–5.20)
RDW: 13.8 % (ref 11.5–14.5)
WBC: 7.9 x10 3/mm (ref 3.6–11.0)

## 2013-12-11 LAB — HEPATIC FUNCTION PANEL A (ARMC)
AST: 22 U/L (ref 15–37)
Albumin: 2.9 g/dL — ABNORMAL LOW (ref 3.4–5.0)
Alkaline Phosphatase: 88 U/L
BILIRUBIN TOTAL: 0.3 mg/dL (ref 0.2–1.0)
Bilirubin, Direct: 0.1 mg/dL (ref 0.00–0.20)
SGPT (ALT): 19 U/L (ref 12–78)
Total Protein: 6.8 g/dL (ref 6.4–8.2)

## 2013-12-11 LAB — FERRITIN: Ferritin (ARMC): 162 ng/mL (ref 8–388)

## 2013-12-12 LAB — CANCER ANTIGEN 27.29: CA 27.29: 7.7 U/mL (ref 0.0–38.6)

## 2013-12-17 ENCOUNTER — Ambulatory Visit: Payer: Self-pay | Admitting: Family Medicine

## 2013-12-17 ENCOUNTER — Ambulatory Visit: Payer: Self-pay | Admitting: Internal Medicine

## 2014-01-22 ENCOUNTER — Ambulatory Visit: Payer: Self-pay | Admitting: Internal Medicine

## 2014-02-16 ENCOUNTER — Ambulatory Visit: Payer: Self-pay | Admitting: Internal Medicine

## 2014-03-19 ENCOUNTER — Ambulatory Visit: Payer: Self-pay | Admitting: Internal Medicine

## 2014-04-04 ENCOUNTER — Ambulatory Visit: Payer: Self-pay | Admitting: Family Medicine

## 2014-04-10 ENCOUNTER — Encounter: Payer: Self-pay | Admitting: General Surgery

## 2014-04-10 ENCOUNTER — Ambulatory Visit: Payer: Medicare Other | Admitting: General Surgery

## 2014-04-11 ENCOUNTER — Encounter: Payer: Self-pay | Admitting: General Surgery

## 2014-04-11 ENCOUNTER — Ambulatory Visit (INDEPENDENT_AMBULATORY_CARE_PROVIDER_SITE_OTHER): Payer: Medicare HMO | Admitting: General Surgery

## 2014-04-11 ENCOUNTER — Other Ambulatory Visit: Payer: Self-pay | Admitting: General Surgery

## 2014-04-11 VITALS — BP 132/74 | HR 78 | Resp 14 | Ht 63.0 in | Wt 158.0 lb

## 2014-04-11 DIAGNOSIS — R920 Mammographic microcalcification found on diagnostic imaging of breast: Secondary | ICD-10-CM

## 2014-04-11 DIAGNOSIS — Z853 Personal history of malignant neoplasm of breast: Secondary | ICD-10-CM

## 2014-04-11 NOTE — Progress Notes (Signed)
Patient ID: Erika Miller, female   DOB: 03-22-1946, 68 y.o.   MRN: [id number]  Chief Complaint  Patient presents with  . Other    mammogram    HPI Charlesetta Batina Dougan is a 68 y.o. female who presents for a breast evaluation. The most recent left breast mammogram was done on  04/03/14 at [facility].  Previous films completed in December 2014 had raised a concern about a cluster of calcifications in the left breast. This was a planned 6 month followup. The patient is unaware of any changes on her on exam. Patient does perform regular self breast checks and gets regular mammograms done.  No new breast complaints. The patient is accompanied today by her family who were present for the interview and exam. Her mobility is significantly limited due to her previous polio as well as ongoing Parkinson's disease. With assistance she was able to get up on the power table.  HPI  Past Medical History  Diagnosis Date  . Cancer 09/2012    Right Breast, pT2,N0 (l+sn)  . 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 Arthritis   . 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 Leg surgery Bilateral 1957, 1958  . Foot surgery Right 1988  . Spine surgery  1959, 1960   . Breast biopsy Right 2008, 2013  . Breast surgery Right 10/2012    Mastectomy with s/n bx, and radiation therapy  . Colonoscopy  2011    Dr. Candace Cruise, 2 benign polyps removed  . Abdominal hysterectomy  08/10/83    No family history on file.  Social History History  Substance Use Topics  . Smoking status: Former Smoker -- 0.50 packs/day    Types: Cigarettes    Quit date: 10/19/2002  . Smokeless tobacco: Never Used  . Alcohol Use: No    Allergies  Allergen Reactions  . Augmentin [Amoxicillin-Pot Clavulanate] Nausea And Vomiting  . Macrobid [Nitrofurantoin Macrocrystal] Rash    Current Outpatient Prescriptions  Medication Sig Dispense  Refill  . anastrozole (ARIMIDEX) 1 MG tablet Take 1 tablet by mouth daily.      . Calcium Carbonate (CALCIUM 600 PO) Take 1 tablet by mouth daily.      . cetirizine (ZYRTEC) 10 MG tablet Take 10 mg by mouth daily.      . Cholecalciferol (VITAMIN D3) 2000 UNITS TABS Take 1 tablet by mouth daily.      Marland Kitchen gabapentin (NEURONTIN) 300 MG capsule Take 1 capsule by mouth 3 (three) times daily.      . Multiple Vitamin (MULTIVITAMIN) tablet Take 1 tablet by mouth daily.      . nicotine polacrilex (NICORETTE) 2 MG gum Take 2 mg by mouth as needed for smoking cessation.      . Ranitidine HCl (ACID CONTROL PO) Take 20 mg by mouth daily.       No current facility-administered medications for this visit.    Review of Systems Review of Systems  Constitutional: Negative.   Respiratory: Negative.   Cardiovascular: Negative.     Blood pressure 132/74, pulse 78, resp. rate 14, height 5\' 3"  (1.6 m), weight 158 lb (71.668 kg).  Physical Exam Physical Exam  Constitutional: She is oriented to person, place, and time. She appears well-developed and well-nourished.  Eyes: Conjunctivae are normal.  Neck: Neck supple.  Cardiovascular: Normal rate, regular rhythm and normal heart sounds.   Pulmonary/Chest: Effort  normal and breath sounds normal. Left breast exhibits no inverted nipple, no mass, no nipple discharge, no skin change and no tenderness.  Right mastectomy site well healed, minimal "dogears" noted medially and laterally of the incision. No evidence of local recurrence.  Lymphadenopathy:    She has no cervical adenopathy.    She has no axillary adenopathy.  Neurological: She is alert and oriented to person, place, and time.  Skin: Skin is warm and dry.  Lipoma inner left upper arm    Data Reviewed Left breast mammograms dated April 03, 2014 were reviewed and compared to the December 2014 studies. Scant interval change on my review. BI-RAD 4 by the radiology service.  Assessment    Left breast  microcalcifications.     Plan    Options for management were reviewed: Continue six-month followup looking for significant interval change versus early biopsy. Considering the patient's previous cancer she is a little antsy about further observation.  Her mobility precludes the possibility of prone position stereotactic biopsy as she is unable to go more than one step, and 3 steps would be necessary to access the gantry. We will investigate the possibility of a seated stereo biopsy in [place], but if this is not possible we'll proceed to wire localization and open biopsy.    Patient's surgery has been scheduled for 04-24-14 at [facility].  PCP: Arta Silence 04/11/2014, 9:15 PM

## 2014-04-11 NOTE — Patient Instructions (Addendum)
Continue self breast exams. Call office for any new breast issues or concerns.  Patient's surgery has been scheduled for 04-24-14 at [facility]

## 2014-04-12 ENCOUNTER — Telehealth: Payer: Self-pay | Admitting: *Deleted

## 2014-04-12 NOTE — Telephone Encounter (Signed)
Per Janett Billow at [place], stereotactic biopsy could be completed with patient in her wheelchair.  Patient was contacted and offered stereo biopsy to be completed on 04-17-14 at 11 am. This patient wishes to decline at this time and wants to proceed with left breast biopsy and needle loc as previously scheduled for 04-24-14 at [facility].   Janett Billow at [place] notified of cancellation via voicemail.  Patient instructed to call the office should she have further questions.

## 2014-04-12 NOTE — Telephone Encounter (Signed)
Message left for [facility] (technologist) at [facility] to see if they can accommodate patient's needs.

## 2014-04-12 NOTE — Telephone Encounter (Signed)
Message copied by Dominga Ferry on Thu Apr 12, 2014 10:05 AM ------      Message from: [place], Forest Gleason      Created: Wed Apr 11, 2014  9:20 PM       See if the [place] facility that does stereo biopsies completes them seeded or standing. If seated, see if this would be more appealing to the patient prior to the open biopsy scheduled for July 7. Thank you ------

## 2014-04-16 ENCOUNTER — Ambulatory Visit: Payer: Self-pay | Admitting: General Surgery

## 2014-04-16 LAB — CBC
HCT: 35.2 % (ref 35.0–47.0)
HGB: 11.7 g/dL — AB (ref 12.0–16.0)
MCH: 32.1 pg (ref 26.0–34.0)
MCHC: 33.4 g/dL (ref 32.0–36.0)
MCV: 96 fL (ref 80–100)
PLATELETS: 252 10*3/uL (ref 150–440)
RBC: 3.65 10*6/uL — ABNORMAL LOW (ref 3.80–5.20)
RDW: 13.9 % (ref 11.5–14.5)
WBC: 9.3 10*3/uL (ref 3.6–11.0)

## 2014-04-18 ENCOUNTER — Ambulatory Visit: Payer: Self-pay | Admitting: Internal Medicine

## 2014-04-24 ENCOUNTER — Encounter: Payer: Self-pay | Admitting: General Surgery

## 2014-04-24 ENCOUNTER — Ambulatory Visit: Payer: Self-pay | Admitting: General Surgery

## 2014-04-24 DIAGNOSIS — R920 Mammographic microcalcification found on diagnostic imaging of breast: Secondary | ICD-10-CM

## 2014-04-24 HISTORY — PX: BREAST BIOPSY: SHX20

## 2014-04-25 ENCOUNTER — Encounter: Payer: Self-pay | Admitting: General Surgery

## 2014-04-26 LAB — PATHOLOGY REPORT

## 2014-04-27 ENCOUNTER — Telehealth: Payer: Self-pay | Admitting: General Surgery

## 2014-04-27 NOTE — Telephone Encounter (Signed)
Path benign. Patient reports minimal pain today, scant bruising. F/U next week as planned.

## 2014-04-30 ENCOUNTER — Encounter: Payer: Self-pay | Admitting: General Surgery

## 2014-05-02 ENCOUNTER — Encounter: Payer: Self-pay | Admitting: General Surgery

## 2014-05-02 ENCOUNTER — Ambulatory Visit (INDEPENDENT_AMBULATORY_CARE_PROVIDER_SITE_OTHER): Payer: Self-pay | Admitting: General Surgery

## 2014-05-02 VITALS — BP 140/78 | HR 84 | Resp 12

## 2014-05-02 DIAGNOSIS — Z853 Personal history of malignant neoplasm of breast: Secondary | ICD-10-CM

## 2014-05-02 DIAGNOSIS — R920 Mammographic microcalcification found on diagnostic imaging of breast: Secondary | ICD-10-CM

## 2014-05-02 NOTE — Patient Instructions (Signed)
Continue self breast exams. Call office for any new breast issues or concerns. 

## 2014-05-02 NOTE — Progress Notes (Signed)
Patient ID: Erika Miller, female   DOB: 1946-05-01, 68 y.o.   MRN: [id number]  Chief Complaint  Patient presents with  . Routine Post Op    left breat biopsy    HPI Erika Miller is a 68 y.o. female.  Here today for follow up left breast biopsy at [facility]. Pathology showed calcifications associated with stroma and sclerosing adenosis on 04-24-14. She states she is getting along well.   HPI  Past Medical History  Diagnosis Date  . Cancer 09/2012    Right Breast, pT2,N0 (l+sn)  . 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 Arthritis   . 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 Leg surgery Bilateral 1957, 1958  . Foot surgery Right 1988  . Spine surgery  1959, 1960   . Colonoscopy  2011    Dr. Candace Cruise, 2 benign polyps removed  . Abdominal hysterectomy  08/10/83  . Breast biopsy Right 2008, 2013  . Breast surgery Right 10/2012    Mastectomy with s/n bx, and radiation therapy  . Breast biopsy Left 04-24-14    Calcifications associated with stroma and sclerosing adenosis    No family history on file.  Social History History  Substance Use Topics  . Smoking status: Former Smoker -- 0.50 packs/day    Types: Cigarettes    Quit date: 10/19/2002  . Smokeless tobacco: Never Used  . Alcohol Use: No    Allergies  Allergen Reactions  . Augmentin [Amoxicillin-Pot Clavulanate] Nausea And Vomiting  . Macrobid [Nitrofurantoin Macrocrystal] Rash    Current Outpatient Prescriptions  Medication Sig Dispense Refill  . anastrozole (ARIMIDEX) 1 MG tablet Take 1 tablet by mouth daily.      . Calcium Carbonate (CALCIUM 600 PO) Take 1 tablet by mouth daily.      . cetirizine (ZYRTEC) 10 MG tablet Take 10 mg by mouth daily.      . Cholecalciferol (VITAMIN D3) 2000 UNITS TABS Take 1 tablet by mouth daily.      Marland Kitchen gabapentin (NEURONTIN) 300 MG capsule Take 1 capsule by mouth 3 (three)  times daily.      . Multiple Vitamin (MULTIVITAMIN) tablet Take 1 tablet by mouth daily.      . nicotine polacrilex (NICORETTE) 2 MG gum Take 2 mg by mouth as needed for smoking cessation.      . Ranitidine HCl (ACID CONTROL PO) Take 20 mg by mouth daily.       No current facility-administered medications for this visit.    Review of Systems Review of Systems  Blood pressure 140/78, pulse 84, resp. rate 12.  Physical Exam Physical Exam  Constitutional: She is oriented to person, place, and time. She appears well-developed and well-nourished.  Pulmonary/Chest:  Left breast incision healing well, minimal bruising  Neurological: She is alert and oriented to person, place, and time.  Skin: Skin is warm.    Data Reviewed 04/24/2014 wire localized biopsy: Calcifications associated with stroma and sclerosing adenosis. No malignancy or atypia.   Assessment    Doing well status post biopsy. No evidence of malignancy.    Plan    Office followup in 6 months in regards to her right mastectomy.        PCP: Arta Silence 05/02/2014, 1:10 PM

## 2014-05-28 ENCOUNTER — Ambulatory Visit: Payer: Self-pay | Admitting: Internal Medicine

## 2014-05-28 LAB — CBC CANCER CENTER
BASOS ABS: 0.1 x10 3/mm (ref 0.0–0.1)
BASOS PCT: 0.7 %
Eosinophil #: 0.3 x10 3/mm (ref 0.0–0.7)
Eosinophil %: 3.2 %
HCT: 35.6 % (ref 35.0–47.0)
HGB: 11.8 g/dL — ABNORMAL LOW (ref 12.0–16.0)
Lymphocyte #: 4.3 x10 3/mm — ABNORMAL HIGH (ref 1.0–3.6)
Lymphocyte %: 47.2 %
MCH: 31.4 pg (ref 26.0–34.0)
MCHC: 33.1 g/dL (ref 32.0–36.0)
MCV: 95 fL (ref 80–100)
Monocyte #: 0.7 x10 3/mm (ref 0.2–0.9)
Monocyte %: 7.9 %
NEUTROS ABS: 3.8 x10 3/mm (ref 1.4–6.5)
NEUTROS PCT: 41 %
PLATELETS: 251 x10 3/mm (ref 150–440)
RBC: 3.74 10*6/uL — ABNORMAL LOW (ref 3.80–5.20)
RDW: 13.4 % (ref 11.5–14.5)
WBC: 9.2 x10 3/mm (ref 3.6–11.0)

## 2014-05-28 LAB — HEPATIC FUNCTION PANEL A (ARMC)
ALBUMIN: 3.1 g/dL — AB (ref 3.4–5.0)
ALT: 21 U/L
Alkaline Phosphatase: 98 U/L
Bilirubin, Direct: <0.05 mg/dL (ref 0.00–0.20)
Bilirubin,Total: 0.4 mg/dL (ref 0.2–1.0)
SGOT(AST): 22 U/L (ref 15–37)
TOTAL PROTEIN: 6.8 g/dL (ref 6.4–8.2)

## 2014-05-28 LAB — CREATININE, SERUM
CREATININE: 0.63 mg/dL (ref 0.60–1.30)
EGFR (African American): >60
EGFR (Non-African Amer.): >60

## 2014-05-28 LAB — FERRITIN: Ferritin (ARMC): 169 ng/mL (ref 8–388)

## 2014-05-30 LAB — CANCER ANTIGEN 27.29: CA 27.29: 5.8 U/mL (ref 0.0–38.6)

## 2014-06-19 ENCOUNTER — Ambulatory Visit: Payer: Self-pay | Admitting: Internal Medicine

## 2014-06-19 IMAGING — PT NM PET TUM IMG RESTAG (PS) SKULL BASE T - THIGH
1 of 5 series · 1 of 25 positions shown · non-contrast
Comparison: none

REASON FOR EXAM: breast CA staging
COMMENTS:

PROCEDURE:     PET - PET/CT RESTG BREAST CA  - December 12, 2012  [DATE]
RESULT:     History: Breast cancer.
Comparison Study: No recent.

[Series 3: ct wb 3.0 b30f · axial · 3.0mm · 0.98mm/px · 1 of 432 slices shown]
[im 432/432  brain]
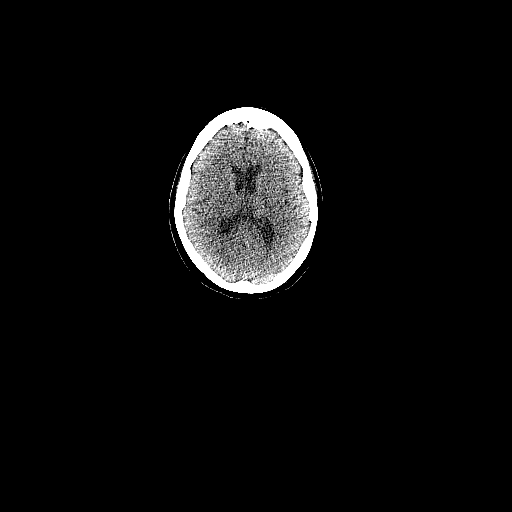

[1 of 25 positions shown; findings below may reference images not displayed]

FINDINGS: Standard PET CT obtained following determination of fasting blood
sugar of 69 mg/dL and administration of 11.06 which is of F-18 FDG. Activity
noted in the at neck musculature. Posterior costovertebral activity is noted
most likely from degenerative change. Minimal chest wall activity is noted
on the right at site of patient's mastectomy. This is nonspecific. No PET
positive lesion noted to suggest metastatic disease.
IMPRESSION: No PET positive lesion is noted to suggest metastatic
disease.

## 2014-07-19 ENCOUNTER — Ambulatory Visit: Payer: Self-pay | Admitting: Internal Medicine

## 2014-08-20 ENCOUNTER — Ambulatory Visit: Payer: Self-pay | Admitting: Internal Medicine

## 2014-08-20 ENCOUNTER — Encounter: Payer: Self-pay | Admitting: General Surgery

## 2014-09-11 ENCOUNTER — Ambulatory Visit: Payer: Self-pay | Admitting: Family Medicine

## 2014-09-18 ENCOUNTER — Ambulatory Visit: Payer: Self-pay | Admitting: Internal Medicine

## 2014-10-02 ENCOUNTER — Ambulatory Visit (INDEPENDENT_AMBULATORY_CARE_PROVIDER_SITE_OTHER): Payer: Medicare HMO | Admitting: General Surgery

## 2014-10-02 ENCOUNTER — Encounter: Payer: Self-pay | Admitting: General Surgery

## 2014-10-02 VITALS — BP 120/64 | HR 72 | Resp 14 | Ht 63.0 in | Wt 153.0 lb

## 2014-10-02 DIAGNOSIS — Z853 Personal history of malignant neoplasm of breast: Secondary | ICD-10-CM

## 2014-10-02 NOTE — Patient Instructions (Addendum)
Patient to return in six month left breast screening mammogram.

## 2014-10-02 NOTE — Progress Notes (Signed)
Patient ID: Erika Miller, female   DOB: Feb 19, 1946, 68 y.o.   MRN: [id number]  Chief Complaint  Patient presents with  . Follow-up    breast cancer    HPI Erika Miller is a 68 y.o. female here today for her 5 month breast cancer follow up. Patient states she is well with no new problems at this time. HPI  Past Medical History  Diagnosis Date  . Cancer 09/2012    Right Breast, pT2,N0 (l+sn)  . 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 Arthritis   . 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 Leg surgery Bilateral 1957, 1958  . Foot surgery Right 1988  . Spine surgery  1959, 1960   . Colonoscopy  2011    Dr. Candace Cruise, 2 benign polyps removed  . Abdominal hysterectomy  08/10/83  . Breast biopsy Right 2008, 2013  . Breast surgery Right 10/2012    Mastectomy with s/n bx, and radiation therapy  . Breast biopsy Left 04-24-14    Calcifications associated with stroma and sclerosing adenosis    No family history on file.  Social History History  Substance Use Topics  . Smoking status: Former Smoker -- 0.50 packs/day    Types: Cigarettes    Quit date: 10/19/2002  . Smokeless tobacco: Never Used  . Alcohol Use: No    Allergies  Allergen Reactions  . Augmentin [Amoxicillin-Pot Clavulanate] Nausea And Vomiting  . Macrobid [Nitrofurantoin Macrocrystal] Rash    Current Outpatient Prescriptions  Medication Sig Dispense Refill  . anastrozole (ARIMIDEX) 1 MG tablet Take 1 tablet by mouth daily.    . Calcium Carbonate (CALCIUM 600 PO) Take 1 tablet by mouth daily.    . cetirizine (ZYRTEC) 10 MG tablet Take 10 mg by mouth daily.    . Cholecalciferol (VITAMIN D3) 2000 UNITS TABS Take 1 tablet by mouth daily.    Marland Kitchen gabapentin (NEURONTIN) 300 MG capsule Take 1 capsule by mouth 3 (three) times daily.    . Multiple Vitamin (MULTIVITAMIN) tablet Take 1 tablet by mouth daily.    . nicotine  polacrilex (NICORETTE) 2 MG gum Take 2 mg by mouth as needed for smoking cessation.    . Ranitidine HCl (ACID CONTROL PO) Take 20 mg by mouth daily.     No current facility-administered medications for this visit.    Review of Systems Review of Systems  Constitutional: Negative.   Respiratory: Negative.   Cardiovascular: Negative.     Blood pressure 120/64, pulse 72, resp. rate 14, height 5\' 3"  (1.6 m), weight 153 lb (69.4 kg).  Physical Exam Physical Exam  Constitutional: She is oriented to person, place, and time. She appears well-developed and well-nourished.  Eyes: Conjunctivae are normal. No scleral icterus.  Neck: Neck supple.  Cardiovascular: Normal rate, regular rhythm and normal heart sounds.   Pulmonary/Chest: Effort normal and breath sounds normal. Left breast exhibits no inverted nipple, no mass, no nipple discharge, no skin change and no tenderness.  Right mastectomy site is well healed.  Abdominal: Soft. Bowel sounds are normal. There is no tenderness.  Lymphadenopathy:    She has no cervical adenopathy.    She has no axillary adenopathy.  Neurological: She is alert and oriented to person, place, and time.  Skin: Skin is warm and dry.     3 by 6 cm tissue mass left arm    Data  Reviewed No new pathology.  Assessment    Doing well status post mastectomy. Tolerating anastrozole therapy well.    Plan    Patient to return in six month left breast screening mammogram.     PCP:  Arta Silence 10/03/2014, 2:57 PM

## 2014-10-19 ENCOUNTER — Ambulatory Visit: Payer: Self-pay | Admitting: Internal Medicine

## 2014-11-19 ENCOUNTER — Ambulatory Visit: Payer: Self-pay | Admitting: Internal Medicine

## 2014-12-18 ENCOUNTER — Ambulatory Visit
Admit: 2014-12-18 | Disposition: A | Discharge status: Home / Self Care | Payer: Self-pay | Attending: Internal Medicine | Admitting: Internal Medicine

## 2015-01-18 ENCOUNTER — Ambulatory Visit
Admit: 2015-01-18 | Disposition: A | Discharge status: Home / Self Care | Payer: Self-pay | Attending: Internal Medicine | Admitting: Internal Medicine

## 2015-02-08 NOTE — Op Note (Signed)
PATIENT NAME:  Erika Miller, Erika Miller MR#:  [id number] DATE OF BIRTH:  [birth year]  DATE OF PROCEDURE:  11/03/2012  PREOPERATIVE DIAGNOSIS: Infiltrating lobular carcinoma of the right breast.   POSTOPERATIVE DIAGNOSIS: Infiltrating lobular carcinoma of the right breast.   PROCEDURE PERFORMED: Right simple mastectomy with sentinel node biopsy.  SURGEON: Robert Bellow, MD  ANESTHESIA: General endotracheal under Dr. Kayleen Memos.  ESTIMATED BLOOD LOSS: 25 mL.  CLINICAL NOTE: This 69 year old woman noted breast pain.  Mammograms were normal. Ultrasound suggested a hypoechoic mass and core biopsy showed evidence of infiltrating lobular carcinoma. Preoperative MRI showed a large mass in the breast. She was offered hormonal therapy and attempt at breast conservation, but has elected to proceed to mastectomy. In preoperative discussion with the pathology service, frozen section analysis of the axillary node was felt to be futile.   The patient was injected with technetium sulfur colloid prior to the procedure and 6 mL of methylene blue diluted 1:2 with normal saline was injected in subareolar plexus, after the induction of anesthesia.   DESCRIPTION OF PROCEDURE: With the patient under adequate general endotracheal anesthesia, the breast was prepped with ChloraPrep and draped. An incision was made sharply and the dissection made using electrocautery. Flaps were raised with the following border:  sternum medially, clavicle superiorly, rectus fascia  inferiorly and serratus muscle laterally. The breast was removed from the underlying pectoralis muscle taking the fascia of that muscle with the specimen. There was one blue lymphatic that did not head to the axilla and could not be correlated with any node. Gamma finder was used to identify one normal size node, in the lower level of the axilla. This was removed and sent for routine section. The axillary tissue showed no additional palpable adenopathy. The wound was  irrigated with water. Good hemostasis was noted. The skin flaps were approximated with running 2-0 Vicryl in two segments. The suture was placed at the deep dermal level. Benzoin and Steri-Strips followed by fluff gauze, Kerlix and an Ace wrap were then applied.  The patient tolerated the procedure well and was taken to the recovery room in stable condition.  ____________________________ Robert Bellow, MD jwb:sb D: 11/04/2012 06:08:42 ET T: 11/04/2012 07:03:07 ET JOB#: [id number]  cc: Robert Bellow, MD, <Dictator> Floria Raveling. Astrid Divine, MD Corry Storie Amedeo Kinsman MD ELECTRONICALLY SIGNED 11/07/2012 20:58

## 2015-02-08 NOTE — Op Note (Signed)
PATIENT NAME:  Erika Miller, Erika Miller MR#:  [id number] DATE OF BIRTH:  07-13-1946  DATE OF PROCEDURE:  12/22/2012  PREOPERATIVE DIAGNOSIS:  Breast cancer, need for central venous access.   POSTOPERATIVE DIAGNOSIS:  Breast cancer, need for central venous access.   OPERATIVE PROCEDURE:  Placement of left subclavian PowerPort.   SURGEON: Robert Bellow, MD  ANESTHESIA:  Attended local, 15 mL of 1% plain Xylocaine.   ESTIMATED BLOOD LOSS:  Minimal.   CLINICAL NOTE:  This patient has undergone a right mastectomy for invasive cancer. She is felt to be a candidate for adjuvant chemotherapy. Central venous access was requested by the treating oncologist. The patient received Kefzol prior to the procedure.   OPERATIVE NOTE:  The left chest was prepped with ChloraPrep and draped. Ultrasound was used to confirm patency of the subclavian vein. This was cannulated under ultrasound guidance. The guidewire was then advanced under fluoroscopy into the right ventricle. The tract was dilated and the PowerPort tubing advanced to the junction of the SVC and right atrium. This was then tunneled to a pocket on the left anterior chest. The port was anchored with 3-point fixation. It easily irrigated and aspirated with the patient in the supine position.   The skin was closed with a running 3-0 Vicryl to the adipose layer and a running 4-0 Vicryl subcuticular suture for the skin. Benzoin, Steri-Strips, Telfa and Tegaderm dressing was then applied.   Erect portable chest x-ray obtained in the recovery room showed no evidence of pneumothorax and the catheter tip as described above.    ____________________________ Robert Bellow, MD jwb:si D: 12/22/2012 21:05:08 ET T: 12/22/2012 23:46:11 ET JOB#: [id number]  cc: Robert Bellow, MD, <Dictator> JEFFREY Amedeo Kinsman MD ELECTRONICALLY SIGNED 12/24/2012 11:20

## 2015-02-09 NOTE — Op Note (Signed)
PATIENT NAME:  Erika Miller, Erika Miller MR#:  [id number] DATE OF BIRTH:  September 01, 1946  DATE OF PROCEDURE:  04/24/2014  PREOPERATIVE DIAGNOSIS: Left breast microcalcifications.   POSTOPERATIVE DIAGNOSIS: Left breast microcalcifications.   OPERATIVE PROCEDURE: Left breast biopsy with needle localization.   SURGEON: Hervey Ard, MD.   ANESTHESIA: General by LMA under Dr. [place] Service, Marcaine 0.5% plain, 30 mL local infiltration.   CLINICAL NOTE: This 69 year old woman has had development of microcalcifications in the left breast. She has had previous contralateral cancer. She was not a candidate for stereotactic biopsy due physical inability to lie prone and climb steps. She was brought to the operating room for planned biopsy having undergone needle localization.   OPERATIVE NOTE: With the patient under adequate general anesthesia, the breast was prepped with ChloraPrep and draped. The wire exited the breast at the 7 o'clock position. A radial incision was made in this area and the localizing wire brought into the operative field and then a block of tissue 3 x 3 x 4 cm in diameter was excised, oriented and sent for specimen radiography. This confirmed the entire tip of the wire as well as the index microcalcifications within the specimen.   The wound was closed in layers with 2-0 Vicryl figure-of-eight sutures. The skin was closed with running 4-0 Vicryl subcuticular suture. Benzoin, Steri-Strips, Telfa and Tegaderm dressing were then applied. The patient tolerated the procedure well and was taken to the recovery room in stable condition.    ____________________________ Robert Bellow, MD jwb:lt D: 04/25/2014 07:43:45 ET T: 04/25/2014 10:44:10 ET JOB#: [id number]  cc: Robert Bellow, MD, <Dictator> Floria Raveling. Astrid Divine, MD Rashema Seawright Amedeo Kinsman MD ELECTRONICALLY SIGNED 04/25/2014 12:19

## 2015-02-11 ENCOUNTER — Ambulatory Visit (INDEPENDENT_AMBULATORY_CARE_PROVIDER_SITE_OTHER): Payer: PPO | Admitting: General Surgery

## 2015-02-11 ENCOUNTER — Encounter: Payer: Self-pay | Admitting: General Surgery

## 2015-02-11 VITALS — BP 128/70 | HR 78 | Resp 13 | Ht 63.0 in | Wt 157.0 lb

## 2015-02-11 DIAGNOSIS — Z853 Personal history of malignant neoplasm of breast: Secondary | ICD-10-CM | POA: Diagnosis not present

## 2015-02-11 NOTE — Patient Instructions (Signed)
Return in one week for a check with the nurse.

## 2015-02-11 NOTE — Progress Notes (Signed)
Patient ID: Erika Miller, female   DOB: Dec 08, 1945, 69 y.o.   MRN: [id number]  Chief Complaint  Patient presents with  . Other    port removal    HPI Erika Miller is a 69 y.o. female here for a port removal. Request removal has been obtained from her medical oncologist. HPI  Past Medical History  Diagnosis Date  . Cancer 09/2012    Right Breast, pT2,N0 (l+sn)  . 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 Arthritis   . 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 Leg surgery Bilateral 1957, 1958  . Foot surgery Right 1988  . Spine surgery  1959, 1960   . Colonoscopy  2011    Dr. Candace Cruise, 2 benign polyps removed  . Abdominal hysterectomy  08/10/83  . Breast biopsy Right 2008, 2013  . Breast surgery Right 10/2012    Mastectomy with s/n bx, and radiation therapy  . Breast biopsy Left 04-24-14    Calcifications associated with stroma and sclerosing adenosis    No family history on file.  Social History History  Substance Use Topics  . Smoking status: Former Smoker -- 0.50 packs/day    Types: Cigarettes    Quit date: 10/19/2002  . Smokeless tobacco: Never Used  . Alcohol Use: No    Allergies  Allergen Reactions  . Augmentin [Amoxicillin-Pot Clavulanate] Nausea And Vomiting  . Macrobid [Nitrofurantoin Macrocrystal] Rash    Current Outpatient Prescriptions  Medication Sig Dispense Refill  . anastrozole (ARIMIDEX) 1 MG tablet Take 1 tablet by mouth daily.    . Calcium Carbonate (CALCIUM 600 PO) Take 1 tablet by mouth daily.    . cetirizine (ZYRTEC) 10 MG tablet Take 10 mg by mouth daily.    . Cholecalciferol (VITAMIN D3) 2000 UNITS TABS Take 1 tablet by mouth daily.    Marland Kitchen gabapentin (NEURONTIN) 300 MG capsule Take 1 capsule by mouth 3 (three) times daily.    . Multiple Vitamin (MULTIVITAMIN) tablet Take 1 tablet by mouth daily.    . nicotine polacrilex (NICORETTE) 2 MG gum  Take 2 mg by mouth as needed for smoking cessation.    . Ranitidine HCl (ACID CONTROL PO) Take 20 mg by mouth daily.     No current facility-administered medications for this visit.    Review of Systems Review of Systems  Blood pressure 128/70, pulse 78, resp. rate 13, height 5\' 3"  (1.6 m), weight 157 lb (71.215 kg).  Physical Exam Physical Exam  Pulmonary/Chest:        Assessment    Candidate for PowerPort removal.    Plan    The area was prepped with alcohol and 10 mL of 0.5% Xylocaine with 0.25% Marcaine with 1-200,000 units epinephrine was utilized well tolerated. ChloraPrep was applied to the skin. The previous incision was opened sharply. The port was exposed in the previous transfixing sutures removed. It was then removed with an intact tip from the wall.  The wound was closed in layers with 3-0 Vicryl to the deep tissue and running 3-0 Vicryl septic suture for the skin. Benzoin Steri-Strips Telfa and Tegaderm dressing was applied.  Patient was instructed in regards to wound care and provided with ice pack. She will return in one week for staff evaluation.    PCP: Arta Silence 02/11/2015, 9:46 PM

## 2015-02-18 ENCOUNTER — Ambulatory Visit (INDEPENDENT_AMBULATORY_CARE_PROVIDER_SITE_OTHER): Payer: PPO

## 2015-02-18 DIAGNOSIS — Z853 Personal history of malignant neoplasm of breast: Secondary | ICD-10-CM

## 2015-02-18 NOTE — Progress Notes (Signed)
Patient ID: Erika Miller, female   DOB: September 15, 1946, 69 y.o.   MRN: [id number] Patient here for a post port removal wound check. The wound is clean, with no signs of infection noted, steri strips in place. Follow up as scheduled.

## 2015-02-19 NOTE — Anesthesia Preprocedure Evaluation (Addendum)
Anesthesia Evaluation  Patient identified by MRN, date of birth, ID band Patient awake    Reviewed: Allergy & Precautions, NPO status , Patient's Chart, lab work & pertinent test results, reviewed documented beta blocker date and time   History of Anesthesia Complications (+) PONV and history of anesthetic complications  Airway Mallampati: II  TM Distance: >3 FB Neck ROM: full    Dental   Pulmonary former smoker,    Pulmonary exam normal       Cardiovascular + dysrhythmias     Neuro/Psych Seizures -,     GI/Hepatic   Endo/Other    Renal/GU      Musculoskeletal  (+) Arthritis -,   Abdominal   Peds  Hematology   Anesthesia Other Findings Hx of right breast CA Polio  Reproductive/Obstetrics                           Anesthesia Physical Anesthesia Plan  ASA: III  Anesthesia Plan: MAC   Post-op Pain Management:    Induction: Intravenous  Airway Management Planned: Nasal Cannula  Additional Equipment:   Intra-op Plan:   Post-operative Plan:   Informed Consent: I have reviewed the patients History and Physical, chart, labs and discussed the procedure including the risks, benefits and alternatives for the proposed anesthesia with the patient or authorized representative who has indicated his/her understanding and acceptance.     Plan Discussed with: CRNA  Anesthesia Plan Comments:         Anesthesia Quick Evaluation

## 2015-02-19 NOTE — Discharge Instructions (Signed)
General Anesthesia, Care After Refer to this sheet in the next few weeks. These instructions provide you with information on caring for yourself after your procedure. Your health care provider may also give you more specific instructions. Your treatment has been planned according to current medical practices, but problems sometimes occur. Call your health care provider if you have any problems or questions after your procedure. WHAT TO EXPECT AFTER THE PROCEDURE After the procedure, it is typical to experience:  Sleepiness.  Nausea and vomiting. HOME CARE INSTRUCTIONS  For the first 24 hours after general anesthesia:  Have a responsible person with you.  Do not drive a car. If you are alone, do not take public transportation.  Do not drink alcohol.  Do not take medicine that has not been prescribed by your health care provider.  Do not sign important papers or make important decisions.  You may resume a normal diet and activities as directed by your health care provider.  Change bandages (dressings) as directed.  If you have questions or problems that seem related to general anesthesia, call the hospital and ask for the anesthetist or anesthesiologist on call. SEEK MEDICAL CARE IF:  You have nausea and vomiting that continue the day after anesthesia.  You develop a rash. SEEK IMMEDIATE MEDICAL CARE IF:   You have difficulty breathing.  You have chest pain.  You have any allergic problems. Document Released: 01/11/2001 Document Revised: 10/10/2013 Document Reviewed: 04/20/2013 [facility] Patient Information 2015 [place], [place]. This information is not intended to replace advice given to you by your health care provider. Make sure you discuss any questions you have with your health care provider.  Cataract Surgery Care After Refer to this sheet in the next few weeks. These instructions provide you with information on caring for yourself after your procedure. Your caregiver  may also give you more specific instructions. Your treatment has been planned according to current medical practices, but problems sometimes occur. Call your caregiver if you have any problems or questions after your procedure.  HOME CARE INSTRUCTIONS   Avoid strenuous activities as directed by your caregiver.  Ask your caregiver when you can resume driving.  Use eyedrops or other medicines to help healing and control pressure inside your eye as directed by your caregiver.  Only take over-the-counter or prescription medicines for pain, discomfort, or fever as directed by your caregiver.  Do not to touch or rub your eyes.  You may be instructed to use a protective shield during the first few days and nights after surgery. If not, wear sunglasses to protect your eyes. This is to protect the eye from pressure or from being accidentally bumped.  Keep the area around your eye clean and dry. Avoid swimming or allowing water to hit you directly in the face while showering. Keep soap and shampoo out of your eyes.  Do not bend or lift heavy objects. Bending increases pressure in the eye. You can walk, climb stairs, and do light household chores.  Do not put a contact lens into the eye that had surgery until your caregiver says it is okay to do so.  Ask your doctor when you can return to work. This will depend on the kind of work that you do. If you work in a dusty environment, you may be advised to wear protective eyewear for a period of time.  Ask your caregiver when it will be safe to engage in sexual activity.  Continue with your regular eye exams as directed  by your caregiver. What to expect:  It is normal to feel itching and mild discomfort for a few days after cataract surgery. Some fluid discharge is also common, and your eye may be sensitive to light and touch.  After 1 to 2 days, even moderate discomfort should disappear. In most cases, healing will take about 6 weeks.  If you  received an intraocular lens (IOL), you may notice that colors are very bright or have a blue tinge. Also, if you have been in bright sunlight, everything may appear reddish for a few hours. If you see these color tinges, it is because your lens is clear and no longer cloudy. Within a few months after receiving an IOL, these extra colors should go away. When you have healed, you will probably need new glasses. SEEK MEDICAL CARE IF:   You have increased bruising around your eye.  You have discomfort not helped by medicine. SEEK IMMEDIATE MEDICAL CARE IF:   You have a fever.  You have a worsening or sudden vision loss.  You have redness, swelling, or increasing pain in the eye.  You have a thick discharge from the eye that had surgery. MAKE SURE YOU:  Understand these instructions.  Will watch your condition.  Will get help right away if you are not doing well or get worse.  YOUR FOLLOW-UP APPOINTMENT IS: Tomorrow, May 5 @ 10:25am   Document Released: 04/24/2005 Document Revised: 12/28/2011 Document Reviewed: 05/29/2011 [facility] Patient Information 2015 [place], [place]. This information is not intended to replace advice given to you by your health care provider. Make sure you discuss any questions you have with your health care provider.

## 2015-02-20 ENCOUNTER — Ambulatory Visit
Admission: RE | Admit: 2015-02-20 | Discharge: 2015-02-20 | Disposition: A | Discharge status: Home / Self Care | Payer: PPO | Source: Ambulatory Visit | Attending: Ophthalmology | Admitting: Ophthalmology

## 2015-02-20 ENCOUNTER — Ambulatory Visit: Payer: PPO | Admitting: Anesthesiology

## 2015-02-20 ENCOUNTER — Encounter
Admission: RE | Disposition: A | Discharge status: Home / Self Care | Payer: Self-pay | Source: Ambulatory Visit | Attending: Ophthalmology

## 2015-02-20 DIAGNOSIS — M1990 Unspecified osteoarthritis, unspecified site: Secondary | ICD-10-CM | POA: Insufficient documentation

## 2015-02-20 DIAGNOSIS — H2511 Age-related nuclear cataract, right eye: Secondary | ICD-10-CM | POA: Insufficient documentation

## 2015-02-20 DIAGNOSIS — F17210 Nicotine dependence, cigarettes, uncomplicated: Secondary | ICD-10-CM | POA: Diagnosis not present

## 2015-02-20 DIAGNOSIS — Z853 Personal history of malignant neoplasm of breast: Secondary | ICD-10-CM | POA: Insufficient documentation

## 2015-02-20 DIAGNOSIS — R569 Unspecified convulsions: Secondary | ICD-10-CM | POA: Insufficient documentation

## 2015-02-20 HISTORY — DX: Acute poliomyelitis, unspecified: A80.9

## 2015-02-20 HISTORY — DX: Scoliosis, unspecified: M41.9

## 2015-02-20 HISTORY — DX: Gastro-esophageal reflux disease without esophagitis: K21.9

## 2015-02-20 HISTORY — DX: Age-related osteoporosis without current pathological fracture: M81.0

## 2015-02-20 HISTORY — DX: Unspecified convulsions: R56.9

## 2015-02-20 HISTORY — DX: Cardiac arrhythmia, unspecified: I49.9

## 2015-02-20 HISTORY — PX: CATARACT EXTRACTION W/PHACO: SHX586

## 2015-02-20 HISTORY — DX: Personal history of other medical treatment: Z92.89

## 2015-02-20 HISTORY — DX: Nausea with vomiting, unspecified: Z98.890

## 2015-02-20 HISTORY — DX: Reserved for inherently not codable concepts without codable children: IMO0001

## 2015-02-20 HISTORY — DX: Other specified postprocedural states: R11.2

## 2015-02-20 SURGERY — PHACOEMULSIFICATION, CATARACT, WITH IOL INSERTION
Anesthesia: Monitor Anesthesia Care | Laterality: Right

## 2015-02-20 MED ORDER — MIDAZOLAM HCL 2 MG/2ML IJ SOLN
INTRAMUSCULAR | Status: DC | PRN
  Administered 2015-02-20 (×2): 1 mg via INTRAVENOUS

## 2015-02-20 MED ORDER — POVIDONE-IODINE 5 % OP SOLN
1.0000 "application " | Freq: Once | OPHTHALMIC | Status: AC
  Administered 2015-02-20: 1 via OPHTHALMIC

## 2015-02-20 MED ORDER — EPINEPHRINE HCL 1 MG/ML IJ SOLN
INTRAMUSCULAR | Status: DC | PRN
Start: 2015-02-20 — End: 2015-02-20
  Administered 2015-02-20: 1 mg

## 2015-02-20 MED ORDER — TIMOLOL MALEATE 0.5 % OP SOLN
OPHTHALMIC | Status: DC | PRN
  Administered 2015-02-20: 1 [drp] via OPHTHALMIC

## 2015-02-20 MED ORDER — ONDANSETRON HCL 4 MG/2ML IJ SOLN
4.0000 mg | Freq: Once | INTRAMUSCULAR | Status: DC | PRN
Start: 2015-02-20 — End: 2015-02-20

## 2015-02-20 MED ORDER — BSS IO SOLN
INTRAOCULAR | Status: DC | PRN
  Administered 2015-02-20: 97 mL via INTRAOCULAR

## 2015-02-20 MED ORDER — NA HYALUR & NA CHOND-NA HYALUR 0.4-0.35 ML IO KIT
PACK | INTRAOCULAR | Status: DC | PRN
  Administered 2015-02-20: 1 mL via INTRAOCULAR

## 2015-02-20 MED ORDER — ACETAMINOPHEN 160 MG/5ML PO SOLN: 325.0000 mg | ORAL | Status: DC | PRN

## 2015-02-20 MED ORDER — CEFUROXIME OPHTHALMIC INJECTION 1 MG/0.1 ML
INJECTION | OPHTHALMIC | Status: DC | PRN
  Administered 2015-02-20: 3 mg via OPHTHALMIC

## 2015-02-20 MED ORDER — BRIMONIDINE TARTRATE 0.2 % OP SOLN
OPHTHALMIC | Status: DC | PRN
  Administered 2015-02-20: 1 [drp] via OPHTHALMIC

## 2015-02-20 MED ORDER — ACETAMINOPHEN 325 MG PO TABS: 325.0000 mg | ORAL_TABLET | ORAL | Status: DC | PRN

## 2015-02-20 MED ORDER — ARMC OPHTHALMIC DILATING GEL
1.0000 "application " | Freq: Once | OPHTHALMIC | Status: AC
  Administered 2015-02-20 (×2): 1 via OPHTHALMIC

## 2015-02-20 MED ORDER — FENTANYL CITRATE (PF) 100 MCG/2ML IJ SOLN
INTRAMUSCULAR | Status: DC | PRN
  Administered 2015-02-20 (×2): 50 ug via INTRAVENOUS

## 2015-02-20 MED ORDER — TETRACAINE HCL 0.5 % OP SOLN
1.0000 [drp] | Freq: Once | OPHTHALMIC | Status: AC
  Administered 2015-02-20: 1 [drp] via OPHTHALMIC

## 2015-02-20 SURGICAL SUPPLY — 25 items
CANNULA ANT/CHMB 27GA (MISCELLANEOUS) ×3 IMPLANT
GLOVE BIO SURGEON STRL SZ 6.5 (GLOVE) ×2 IMPLANT
GLOVE BIO SURGEONS STRL SZ 6.5 (GLOVE) ×1
GLOVE SURG LX 7.5 STRW (GLOVE) ×2
GLOVE SURG LX STRL 7.5 STRW (GLOVE) ×1 IMPLANT
GLOVE SURG TRIUMPH 8.0 PF LTX (GLOVE) ×3 IMPLANT
GOWN STRL REUS W/ TWL LRG LVL3 (GOWN DISPOSABLE) ×2 IMPLANT
GOWN STRL REUS W/TWL LRG LVL3 (GOWN DISPOSABLE) ×4
Intraocular lens ×3 IMPLANT
MARKER SKIN SURG W/RULER VIO (MISCELLANEOUS) ×3 IMPLANT
NDL RETROBULBAR .5 NSTRL (NEEDLE) IMPLANT
NEEDLE FILTER BLUNT 18X 1/2SAF (NEEDLE) ×2
NEEDLE FILTER BLUNT 18X1 1/2 (NEEDLE) ×1 IMPLANT
PACK CATARACT BRASINGTON (MISCELLANEOUS) ×3 IMPLANT
PACK EYE AFTER SURG (MISCELLANEOUS) ×3 IMPLANT
PACK OPTHALMIC (MISCELLANEOUS) ×3 IMPLANT
RING MALYGIN 7.0 (MISCELLANEOUS) IMPLANT
SUT ETHILON 10-0 CS-B-6CS-B-6 (SUTURE)
SUT VICRYL  9 0 (SUTURE)
SUT VICRYL 9 0 (SUTURE) IMPLANT
SUTURE EHLN 10-0 CS-B-6CS-B-6 (SUTURE) IMPLANT
SYR 3ML LL SCALE MARK (SYRINGE) ×3 IMPLANT
SYR TB 1ML LUER SLIP (SYRINGE) ×3 IMPLANT
WATER STERILE IRR 500ML POUR (IV SOLUTION) ×3 IMPLANT
WIPE NON LINTING 3.25X3.25 (MISCELLANEOUS) ×3 IMPLANT

## 2015-02-20 NOTE — Anesthesia Procedure Notes (Signed)
Procedure Name: MAC Performed by: Adrien Dietzman Pre-anesthesia Checklist: Patient identified, Emergency Drugs available, Suction available, Patient being monitored and Timeout performed Patient Re-evaluated:Patient Re-evaluated prior to inductionOxygen Delivery Method: Nasal cannula       

## 2015-02-20 NOTE — Op Note (Signed)
LOCATION:  [place]   PREOPERATIVE DIAGNOSIS:  Nuclear sclerotic cataract right eye. H25.11   POSTOPERATIVE DIAGNOSIS:  Nuclear sclerotic cataract right eye.     PROCEDURE:  Phacoemulsification with posterior chamber intraocular lens implantation of the right eye.     LENS:  [id number] 19.0 diopter posterior chamber intraocular lens  ULTRASOUND TIME: 13 % of 1 minutes, 9 seconds.  CDE 8.9   SURGEON:  Wyonia Hough, MD   ANESTHESIA:  Topical with tetracaine drops and 2% Xylocaine jelly.   COMPLICATIONS:  None.   DESCRIPTION OF PROCEDURE:  The patient was identified in the holding room and transported to the operating room and placed in the supine position under the operating microscope.  The right eye was identified as the operative eye and it was prepped and draped in the usual sterile ophthalmic fashion.   A 1 millimeter clear-corneal paracentesis was made at the 12:00 position.  The anterior chamber was filled with Viscoat viscoelastic.  A 2.4 millimeter keratome was used to make a near-clear corneal incision at the 9:00 position.  A curvilinear capsulorrhexis was made with a cystotome and capsulorrhexis forceps.  Balanced salt solution was used to hydrodissect and hydrodelineate the nucleus.   Phacoemulsification was then used in stop and chop fashion to remove the lens nucleus and epinucleus.  The remaining cortex was then removed using the irrigation and aspiration handpiece. Provisc was then placed into the capsular bag to distend it for lens placement.  A 19.0 -diopter lens was then injected into the capsular bag.  The remaining viscoelastic was aspirated.   Wounds were hydrated with balanced salt solution.  The anterior chamber was inflated to a physiologic pressure with balanced salt solution.  No wound leaks were noted. Cefuroxime 0.1 ml of a 10mg /ml solution was injected into the anterior chamber for a dose of 1 mg of intracameral antibiotic at the completion of  the case. Timolol and Brimonidine drops were applied to the eye.  The patient was taken to the recovery room in stable condition without complications of anesthesia or surgery.   Suhaan Perleberg 02/20/2015, 8:05 AM

## 2015-02-20 NOTE — Anesthesia Postprocedure Evaluation (Signed)
  Anesthesia Post-op Note  Patient: Erika Miller  Procedure(s) Performed: Procedure(s): CATARACT EXTRACTION PHACO AND INTRAOCULAR LENS PLACEMENT (IOC) (Right)  Anesthesia type:MAC  Patient location: PACU  Post pain: Pain level controlled  Post assessment: Post-op Vital signs reviewed, Patient's Cardiovascular Status Stable, Respiratory Function Stable, Patent Airway and No signs of Nausea or vomiting  Post vital signs: Reviewed and stable  Last Vitals:  Filed Vitals:   02/20/15 0809  BP: 113/62  Pulse: 61  Temp: 36.4 C  Resp: 16    Level of consciousness: awake, alert  and patient cooperative  Complications: No apparent anesthesia complications

## 2015-02-20 NOTE — H&P (Signed)
  The History and Physical notes were faxed and scanned in.  There were no changes in the patient's condition.

## 2015-02-20 NOTE — Transfer of Care (Signed)
Immediate Anesthesia Transfer of Care Note  Patient: Erika Miller  Procedure(s) Performed: Procedure(s): CATARACT EXTRACTION PHACO AND INTRAOCULAR LENS PLACEMENT (IOC) (Right)  Patient Location: PACU  Anesthesia Type: MAC  Level of Consciousness: awake, alert  and patient cooperative  Airway and Oxygen Therapy: Patient Spontanous Breathing and Patient connected to supplemental oxygen  Post-op Assessment: Post-op Vital signs reviewed, Patient's Cardiovascular Status Stable, Respiratory Function Stable, Patent Airway and No signs of Nausea or vomiting  Post-op Vital Signs: Reviewed and stable  Complications: No apparent anesthesia complications

## 2015-02-25 ENCOUNTER — Other Ambulatory Visit: Payer: Self-pay

## 2015-02-25 DIAGNOSIS — Z1231 Encounter for screening mammogram for malignant neoplasm of breast: Secondary | ICD-10-CM

## 2015-02-27 ENCOUNTER — Encounter: Payer: Self-pay | Admitting: Ophthalmology

## 2015-03-07 ENCOUNTER — Encounter: Payer: Self-pay | Admitting: Ophthalmology

## 2015-04-08 ENCOUNTER — Ambulatory Visit
Admission: RE | Admit: 2015-04-08 | Discharge: 2015-04-08 | Disposition: A | Discharge status: Home / Self Care | Payer: PPO | Source: Ambulatory Visit | Attending: General Surgery | Admitting: General Surgery

## 2015-04-08 DIAGNOSIS — Z1231 Encounter for screening mammogram for malignant neoplasm of breast: Secondary | ICD-10-CM | POA: Insufficient documentation

## 2015-04-08 HISTORY — DX: Malignant neoplasm of unspecified site of unspecified female breast: C50.919

## 2015-04-16 ENCOUNTER — Ambulatory Visit (INDEPENDENT_AMBULATORY_CARE_PROVIDER_SITE_OTHER): Payer: PPO | Admitting: General Surgery

## 2015-04-16 ENCOUNTER — Encounter: Payer: Self-pay | Admitting: General Surgery

## 2015-04-16 VITALS — BP 120/68 | HR 68 | Resp 14 | Ht 60.0 in | Wt 156.0 lb

## 2015-04-16 DIAGNOSIS — Z853 Personal history of malignant neoplasm of breast: Secondary | ICD-10-CM | POA: Diagnosis not present

## 2015-04-16 NOTE — Patient Instructions (Signed)
Patient will be asked to return to the office in one year with a left breast  screening mammogram. 

## 2015-04-16 NOTE — Progress Notes (Signed)
Patient ID: Erika Miller, female   DOB: 02-May-1946, 69 y.o.   MRN: [id number]  Chief Complaint  Patient presents with  . Follow-up    mammogram    HPI Erika Miller is a 69 y.o. female who presents for a breast evaluation. The most recent left breast  mammogram was done on 04/08/15.  Patient does perform regular self breast checks and gets regular mammograms done.    HPI  Past Medical History  Diagnosis Date  . 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 PONV (postoperative nausea and vomiting) 1 time    after hysterectomy  . Dysrhythmia     heart skipped beat per patient, checked by Dr Saralyn Pilar  . Transfusion history   . Seizures x3    [facility] could find nothing wrong.Dec 2015  . Polio     Has been in a wheelchair last 10-12 years from weakness and instability  . Arthritis     hands,knees,shoulders  . Osteoporosis   . Scoliosis   . Reflux   . Cancer 09/2012    Right Breast, pT2,N0 (l+sn)  . 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 Breast cancer 2013    with chemo    Past Surgical History  Procedure Laterality Date  . Leg surgery Bilateral 1957, 1958  . Foot surgery Right 1988  . Spine surgery  1959, 1960   . Colonoscopy  2011    Dr. Candace Cruise, 2 benign polyps removed  . Abdominal hysterectomy  08/10/83  . Breast surgery Right 10/2012    Mastectomy with s/n bx, and radiation therapy  . Back surgery      x 2  . Tubal ligation    . Mastectomy    . Portacath placement  10/2012    recently removed  . Cataract extraction w/phaco Right 02/20/2015    Procedure: CATARACT EXTRACTION PHACO AND INTRAOCULAR LENS PLACEMENT (IOC);  Surgeon: Leandrew Koyanagi, MD;  Location: [place];  Service: Ophthalmology;  Laterality: Right;  . Breast biopsy Right 2008, 2013    2008 negative, 2013 positive  . Breast biopsy Left 04-24-14    Calcifications associated with stroma and sclerosing adenosis     Family History  Problem Relation Age of Onset  . Breast cancer Sister 104    Social History History  Substance Use Topics  . Smoking status: Former Smoker -- 0.50 packs/day    Types: Cigarettes    Quit date: 10/19/2002  . Smokeless tobacco: Never Used  . Alcohol Use: No    Allergies  Allergen Reactions  . Augmentin [Amoxicillin-Pot Clavulanate] Nausea And Vomiting  . Macrobid [Nitrofurantoin Macrocrystal] Rash    Current Outpatient Prescriptions  Medication Sig Dispense Refill  . anastrozole (ARIMIDEX) 1 MG tablet Take 1 tablet by mouth daily. Dinner time    . Calcium Carbonate (CALCIUM 600 PO) Take 1 tablet by mouth daily. Dinner    . cetirizine (ZYRTEC) 10 MG tablet Take 10 mg by mouth daily. AM    . Cholecalciferol (VITAMIN D3) 2000 UNITS TABS Take 1 tablet by mouth daily. Dinner    . famotidine (PEPCID) 20 MG tablet Take 20 mg by mouth daily. Am    . gabapentin (NEURONTIN) 300 MG capsule Take 300 mg by mouth 3 (three) times daily.     Marland Kitchen ibuprofen (ADVIL,MOTRIN) 200 MG tablet Take 200 mg by mouth every 6 (six) hours as needed.    . Multiple  Vitamin (MULTIVITAMIN) tablet Take 1 tablet by mouth daily. Dinner    . nicotine polacrilex (NICORETTE) 2 MG gum Take 2 mg by mouth as needed for smoking cessation.     No current facility-administered medications for this visit.    Review of Systems Review of Systems  Constitutional: Negative.   Respiratory: Negative.   Cardiovascular: Negative.     Blood pressure 120/68, pulse 68, resp. rate 14, height 5' (1.524 m), weight 156 lb (70.761 kg).  Physical Exam Physical Exam  Constitutional: She is oriented to person, place, and time. She appears well-developed and well-nourished.  HENT:  Mouth/Throat: Oropharynx is clear and moist.  Eyes: Conjunctivae are normal. No scleral icterus.  Neck: Neck supple.  Cardiovascular: Normal rate, regular rhythm and normal heart sounds.   Pulmonary/Chest: Effort normal and breath sounds  normal. Left breast exhibits no inverted nipple, no mass, no nipple discharge, no skin change and no tenderness.  Left breast excision well healed. Right mastectomy site well healed.  Lymphadenopathy:    She has no cervical adenopathy.  Neurological: She is alert and oriented to person, place, and time.  Skin: Skin is warm.  Psychiatric: She has a normal mood and affect.    Data Reviewed Screening mammogram of the left breast dated 04/08/2015 was reviewed. BI-RADS-1.  Assessment    Doing well status post right mastectomy. Benign left breast exam.    Plan             Patient will be asked to return to the office in one year with a left breast screening mammogram.   PCP:  Arta Silence 04/17/2015, 9:11 PM

## 2015-06-03 ENCOUNTER — Telehealth: Payer: Self-pay | Admitting: *Deleted

## 2015-06-03 MED ORDER — ANASTROZOLE 1 MG PO TABS: 1.0000 mg | ORAL_TABLET | Freq: Every day | ORAL | Status: DC

## 2015-06-03 NOTE — Telephone Encounter (Signed)
Escribed

## 2015-08-12 ENCOUNTER — Ambulatory Visit: Payer: PPO | Admitting: Anesthesiology

## 2015-08-12 ENCOUNTER — Encounter: Payer: Self-pay | Admitting: Anesthesiology

## 2015-08-12 ENCOUNTER — Encounter
Admission: RE | Disposition: A | Discharge status: Home / Self Care | Payer: Self-pay | Source: Ambulatory Visit | Attending: Gastroenterology

## 2015-08-12 ENCOUNTER — Ambulatory Visit
Admission: RE | Admit: 2015-08-12 | Discharge: 2015-08-12 | Disposition: A | Discharge status: Home / Self Care | Payer: PPO | Source: Ambulatory Visit | Attending: Gastroenterology | Admitting: Gastroenterology

## 2015-08-12 DIAGNOSIS — G7089 Other specified myoneural disorders: Secondary | ICD-10-CM | POA: Insufficient documentation

## 2015-08-12 DIAGNOSIS — Z853 Personal history of malignant neoplasm of breast: Secondary | ICD-10-CM | POA: Diagnosis not present

## 2015-08-12 DIAGNOSIS — Z881 Allergy status to other antibiotic agents status: Secondary | ICD-10-CM | POA: Insufficient documentation

## 2015-08-12 DIAGNOSIS — M89669 Osteopathy after poliomyelitis, unspecified lower leg: Secondary | ICD-10-CM | POA: Diagnosis not present

## 2015-08-12 DIAGNOSIS — K219 Gastro-esophageal reflux disease without esophagitis: Secondary | ICD-10-CM | POA: Insufficient documentation

## 2015-08-12 DIAGNOSIS — M810 Age-related osteoporosis without current pathological fracture: Secondary | ICD-10-CM | POA: Diagnosis not present

## 2015-08-12 DIAGNOSIS — M1990 Unspecified osteoarthritis, unspecified site: Secondary | ICD-10-CM | POA: Diagnosis not present

## 2015-08-12 DIAGNOSIS — R1310 Dysphagia, unspecified: Secondary | ICD-10-CM | POA: Diagnosis present

## 2015-08-12 DIAGNOSIS — Z79899 Other long term (current) drug therapy: Secondary | ICD-10-CM | POA: Insufficient documentation

## 2015-08-12 DIAGNOSIS — K222 Esophageal obstruction: Secondary | ICD-10-CM | POA: Insufficient documentation

## 2015-08-12 DIAGNOSIS — Z993 Dependence on wheelchair: Secondary | ICD-10-CM | POA: Insufficient documentation

## 2015-08-12 DIAGNOSIS — B91 Sequelae of poliomyelitis: Secondary | ICD-10-CM | POA: Insufficient documentation

## 2015-08-12 DIAGNOSIS — Z9109 Other allergy status, other than to drugs and biological substances: Secondary | ICD-10-CM | POA: Diagnosis not present

## 2015-08-12 DIAGNOSIS — M419 Scoliosis, unspecified: Secondary | ICD-10-CM | POA: Insufficient documentation

## 2015-08-12 DIAGNOSIS — Z86010 Personal history of colonic polyps: Secondary | ICD-10-CM | POA: Insufficient documentation

## 2015-08-12 DIAGNOSIS — Z87891 Personal history of nicotine dependence: Secondary | ICD-10-CM | POA: Diagnosis not present

## 2015-08-12 HISTORY — PX: COLONOSCOPY WITH PROPOFOL: SHX5780

## 2015-08-12 HISTORY — PX: ESOPHAGOGASTRODUODENOSCOPY (EGD) WITH PROPOFOL: SHX5813

## 2015-08-12 SURGERY — COLONOSCOPY WITH PROPOFOL
Anesthesia: General

## 2015-08-12 MED ORDER — PHENYLEPHRINE HCL 10 MG/ML IJ SOLN
INTRAMUSCULAR | Status: DC | PRN
  Administered 2015-08-12: 100 ug via INTRAVENOUS

## 2015-08-12 MED ORDER — SODIUM CHLORIDE 0.9 % IV SOLN
INTRAVENOUS | Status: DC
  Administered 2015-08-12: 08:00:00 via INTRAVENOUS

## 2015-08-12 MED ORDER — SODIUM CHLORIDE 0.9 % IV SOLN
INTRAVENOUS | Status: DC
  Administered 2015-08-12: 09:00:00 via INTRAVENOUS

## 2015-08-12 MED ORDER — FENTANYL CITRATE (PF) 100 MCG/2ML IJ SOLN
INTRAMUSCULAR | Status: DC | PRN
  Administered 2015-08-12: 50 ug via INTRAVENOUS

## 2015-08-12 MED ORDER — PROPOFOL 500 MG/50ML IV EMUL
INTRAVENOUS | Status: DC | PRN
Start: 2015-08-12 — End: 2015-08-12
  Administered 2015-08-12: 100 ug/kg/min via INTRAVENOUS

## 2015-08-12 MED ORDER — MIDAZOLAM HCL 2 MG/2ML IJ SOLN
INTRAMUSCULAR | Status: DC | PRN
  Administered 2015-08-12: 1 mg via INTRAVENOUS

## 2015-08-12 NOTE — H&P (Signed)
Primary Care Physician:  Gayland Curry, MD Primary Gastroenterologist:  Dr. Candace Cruise  Pre-Procedure History & Physical: HPI:  Erika Miller is a 69 y.o. female is here for an EGD/colonoscopy.  Past Medical History  Diagnosis Date  . 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 PONV (postoperative nausea and vomiting) 1 time    after hysterectomy  . Dysrhythmia     heart skipped beat per patient, checked by Dr Saralyn Pilar  . Transfusion history   . Seizures x3    Dr [facility] could find nothing wrong.Dec 2015  . Polio     Has been in a wheelchair last 10-12 years from weakness and instability  . Arthritis     hands,knees,shoulders  . Osteoporosis   . Scoliosis   . Reflux   . Cancer 09/2012    Right Breast, pT2,N0 (l+sn)  . 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 Breast cancer 2013    with chemo    Past Surgical History  Procedure Laterality Date  . Leg surgery Bilateral 1957, 1958  . Foot surgery Right 1988  . Spine surgery  1959, 1960   . Colonoscopy  2011    Dr. Candace Cruise, 2 benign polyps removed  . Abdominal hysterectomy  08/10/83  . Breast surgery Right 10/2012    Mastectomy with s/n bx, and radiation therapy  . Back surgery      x 2  . Tubal ligation    . Mastectomy    . Portacath placement  10/2012    recently removed  . Cataract extraction w/phaco Right 02/20/2015    Procedure: CATARACT EXTRACTION PHACO AND INTRAOCULAR LENS PLACEMENT (IOC);  Surgeon: Leandrew Koyanagi, MD;  Location: [facility];  Service: Ophthalmology;  Laterality: Right;  . Breast biopsy Right 2008, 2013    2008 negative, 2013 positive  . Breast biopsy Left 04-24-14    Calcifications associated with stroma and sclerosing adenosis    Prior to Admission medications   Medication Sig Start Date End Date Taking? Authorizing Provider  anastrozole (ARIMIDEX) 1 MG tablet Take 1 tablet (1 mg total) by mouth daily.  Dinner time 06/03/15   Leia Alf, MD  Calcium Carbonate (CALCIUM 600 PO) Take 1 tablet by mouth daily. Dinner    Management consultant, MD  cetirizine (ZYRTEC) 10 MG tablet Take 10 mg by mouth daily. AM    Historical Provider, MD  Cholecalciferol (VITAMIN D3) 2000 UNITS TABS Take 1 tablet by mouth daily. Dinner    Management consultant, MD  famotidine (PEPCID) 20 MG tablet Take 20 mg by mouth daily. Am    Historical Provider, MD  gabapentin (NEURONTIN) 300 MG capsule Take 300 mg by mouth 3 (three) times daily.  08/29/13   Historical Provider, MD  ibuprofen (ADVIL,MOTRIN) 200 MG tablet Take 200 mg by mouth every 6 (six) hours as needed.    Historical Provider, MD  Multiple Vitamin (MULTIVITAMIN) tablet Take 1 tablet by mouth daily. Dinner    Management consultant, MD  nicotine polacrilex (NICORETTE) 2 MG gum Take 2 mg by mouth as needed for smoking cessation.    Historical Provider, MD    Allergies as of 07/01/2015 - Review Complete 02/20/2015  Allergen Reaction Noted  . Augmentin [amoxicillin-pot clavulanate] Nausea And Vomiting 04/11/2014  . Macrobid [nitrofurantoin macrocrystal] Rash 09/26/2013    Family History  Problem Relation Age of Onset  . Breast cancer Sister 39    Social History  Social History  . Marital Status: Married    Spouse Name: N/A  . Number of Children: N/A  . Years of Education: N/A   Occupational History  . Not on file.   Social History Main Topics  . Smoking status: Former Smoker -- 0.50 packs/day    Types: Cigarettes    Quit date: 10/19/2002  . Smokeless tobacco: Never Used  . Alcohol Use: No  . Drug Use: No  . Sexual Activity: Not on file   Other Topics Concern  . Not on file   Social History Narrative    Review of Systems: See HPI, otherwise negative ROS  Physical Exam: There were no vitals taken for this visit. General:   Alert,  pleasant and cooperative in NAD Head:  Normocephalic and atraumatic. Neck:  Supple; no masses or  thyromegaly. Lungs:  Clear throughout to auscultation.    Heart:  Regular rate and rhythm. Abdomen:  Soft, nontender and nondistended. Normal bowel sounds, without guarding, and without rebound.   Neurologic:  Alert and  oriented x4;  grossly normal neurologically.  Impression/Plan: Erika Miller is here for an EGD/colonoscopy to be performed for dysphagia/hx of colon polyps.  Risks, benefits, limitations, and alternatives regarding  EGD/colonoscopy have been reviewed with the patient.  Questions have been answered.  All parties agreeable.   Faren Florence, Lupita Dawn, MD  08/12/2015, 8:11 AM

## 2015-08-12 NOTE — Progress Notes (Signed)
   08/12/15 1005  Aldrete  Activity 2  Respiration 2  Circulation 2  Consciousness 2  Color 2  Aldrete Score 10

## 2015-08-12 NOTE — Op Note (Signed)
[facility] Gastroenterology Patient Name: Erika Miller Procedure Date: 08/12/2015 8:56 AM MRN: [id number] Account #: [id number] Date of Birth: 1946-10-18 Admit Type: Outpatient Age: 69 Room: [facility] ENDO ROOM 4 Gender: Female Note Status: Finalized Procedure:         Colonoscopy Indications:       Personal history of colonic polyps Providers:         Lupita Dawn. Candace Cruise, MD Referring MD:      Atilano Median, MD (Referring MD) Medicines:         Monitored Anesthesia Care Complications:     No immediate complications. Procedure:         Pre-Anesthesia Assessment:                    - Prior to the procedure, a History and Physical was                     performed, and patient medications, allergies and                     sensitivities were reviewed. The patient's tolerance of                     previous anesthesia was reviewed.                    - The risks and benefits of the procedure and the sedation                     options and risks were discussed with the patient. All                     questions were answered and informed consent was obtained.                    - After reviewing the risks and benefits, the patient was                     deemed in satisfactory condition to undergo the procedure.                    After obtaining informed consent, the colonoscope was                     passed under direct vision. Throughout the procedure, the                     patient's blood pressure, pulse, and oxygen saturations                     were monitored continuously. The Colonoscope was                     introduced through the anus and advanced to the the cecum,                     identified by appendiceal orifice and ileocecal valve. The                     colonoscopy was performed without difficulty. The patient                     tolerated the procedure well. The quality of the bowel  preparation was good. Findings:      The colon  (entire examined portion) appeared normal. Impression:        - The entire examined colon is normal.                    - No specimens collected. Recommendation:    - Discharge patient to home.                    - Repeat colonoscopy in 5 years for surveillance.                    - The findings and recommendations were discussed with the                     patient. Procedure Code(s): --- Professional ---                    [id number], Colonoscopy, flexible; diagnostic, including                     collection of specimen(s) by brushing or washing, when                     performed (separate procedure) Diagnosis Code(s): --- Professional ---                    Z86.010, Personal history of colonic polyps CPT copyright 2014 American Medical Association. All rights reserved. The codes documented in this report are preliminary and upon coder review may  be revised to meet current compliance requirements. Hulen Luster, MD 08/12/2015 9:28:57 AM This report has been signed electronically. Number of Addenda: 0 Note Initiated On: 08/12/2015 8:56 AM Total Procedure Duration: 0 hours 8 minutes 45 seconds       [facility]

## 2015-08-12 NOTE — Op Note (Signed)
[facility] Gastroenterology Patient Name: Erika Miller Procedure Date: 08/12/2015 8:57 AM MRN: [id number] Account #: [id number] Date of Birth: [birth year] Admit Type: Outpatient Age: 69 Room: [facility] ENDO ROOM 4 Gender: Female Note Status: Finalized Procedure:         Upper GI endoscopy Indications:       Dysphagia Providers:         Lupita Dawn. Candace Cruise, MD Referring MD:      Atilano Median, MD (Referring MD) Medicines:         Monitored Anesthesia Care Complications:     No immediate complications. Procedure:         Pre-Anesthesia Assessment:                    - Prior to the procedure, a History and Physical was                     performed, and patient medications, allergies and                     sensitivities were reviewed. The patient's tolerance of                     previous anesthesia was reviewed.                    - The risks and benefits of the procedure and the sedation                     options and risks were discussed with the patient. All                     questions were answered and informed consent was obtained.                    - After reviewing the risks and benefits, the patient was                     deemed in satisfactory condition to undergo the procedure.                    After obtaining informed consent, the endoscope was passed                     under direct vision. Throughout the procedure, the                     patient's blood pressure, pulse, and oxygen saturations                     were monitored continuously. The Endoscope was introduced                     through the mouth, and advanced to the second part of                     duodenum. The upper GI endoscopy was accomplished without                     difficulty. The patient tolerated the procedure well. Findings:      A benign-appearing, intrinsic mild stenosis was found at the       cricopharyngeus.      A benign-appearing, intrinsic mild stenosis was found at the  gastroesophageal junction. A TTS dilator was passed through the scope.       Dilation with a 15-16.5-18 mm x 5.5 cm CRE balloon (to a maximum balloon       size of 18 mm) dilator was performed. New 54 Fr Maloney dilator could       not be passed.      The exam of the esophagus was otherwise normal.      The entire examined stomach was normal.      The examined duodenum was normal. Impression:        - Benign-appearing esophageal stricture.                    - Benign-appearing esophageal stricture. Dilated.                    - Normal stomach.                    - Normal examined duodenum.                    - No specimens collected. Recommendation:    - Discharge patient to home.                    - Observe patient's clinical course.                    - The findings and recommendations were discussed with the                     patient. Procedure Code(s): --- Professional ---                    [phone], Esophagogastroduodenoscopy, flexible, transoral;                     with transendoscopic balloon dilation of esophagus (less                     than 30 mm diameter) Diagnosis Code(s): --- Professional ---                    K22.2, Esophageal obstruction                    R13.10, Dysphagia, unspecified CPT copyright 2014 American Medical Association. All rights reserved. The codes documented in this report are preliminary and upon coder review may  be revised to meet current compliance requirements. Hulen Luster, MD 08/12/2015 9:15:59 AM This report has been signed electronically. Number of Addenda: 0 Note Initiated On: 08/12/2015 8:57 AM      [facility]

## 2015-08-12 NOTE — Anesthesia Preprocedure Evaluation (Signed)
Anesthesia Evaluation  Patient identified by MRN, date of birth, ID band Patient awake    Reviewed: Allergy & Precautions, H&P , NPO status , Patient's Chart, lab work & pertinent test results, reviewed documented beta blocker date and time   History of Anesthesia Complications (+) PONV and history of anesthetic complications  Airway Mallampati: II  TM Distance: >3 FB Neck ROM: full    Dental  (+) Partial Upper, Partial Lower   Pulmonary neg shortness of breath, neg sleep apnea, neg COPD, neg recent URI, former smoker,    Pulmonary exam normal breath sounds clear to auscultation       Cardiovascular Exercise Tolerance: Good negative cardio ROS Normal cardiovascular exam Rhythm:regular Rate:Normal     Neuro/Psych Seizures - (3 episodes, none since.  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, lower*              PONV (postoperative nausea and vomiting)        1 time         Comment:after hysterectomy   Dysrhythmia                                                    Comment:heart skipped beat per patient, checked by Dr               Saralyn Pilar   Transfusion history                                          Seizures ([place])                                  x3             Comment:Dr [facility] could find nothing               wrong.Dec 2015   Polio                                                          Comment:Has been in a wheelchair last 10-12 years from               weakness and instability   Arthritis                                                       Comment:hands,knees,shoulders   Osteoporosis  Scoliosis                                                    Reflux                                                       Cancer ([place])                                    09/2012        Comment:Right Breast, pT2,N0 (l+sn)   Malignant neoplasm of lower-inner quadrant of *                Comment:right, s/p chemotherapy   Breast cancer ([place])                             2013           Comment:with chemo   Reproductive/Obstetrics negative OB ROS                             Anesthesia Physical Anesthesia Plan  ASA: II  Anesthesia Plan: General   Post-op Pain Management:    Induction:   Airway Management Planned:   Additional Equipment:   Intra-op Plan:   Post-operative Plan:   Informed Consent: I have reviewed the patients History and Physical, chart, labs and discussed the procedure including the risks, benefits and alternatives for the proposed anesthesia with the patient or authorized representative who has indicated his/her understanding and acceptance.   Dental Advisory Given  Plan Discussed with: Anesthesiologist, CRNA and Surgeon  Anesthesia Plan Comments:         Anesthesia Quick Evaluation

## 2015-08-12 NOTE — Transfer of Care (Signed)
Immediate Anesthesia Transfer of Care Note  Patient: Erika Miller  Procedure(s) Performed: Procedure(s): COLONOSCOPY WITH PROPOFOL (N/A) ESOPHAGOGASTRODUODENOSCOPY (EGD) WITH PROPOFOL (N/A)  Patient Location: PACU and Endoscopy Unit  Anesthesia Type:General  Level of Consciousness: sedated and patient cooperative  Airway & Oxygen Therapy: Patient Spontanous Breathing  Post-op Assessment: Report given to RN and Post -op Vital signs reviewed and stable  Post vital signs: Reviewed and stable  Last Vitals:  Filed Vitals:   08/12/15 0932  BP: 92/45  Pulse: 75  Temp: 36.4 C  Resp: 12    Complications: No apparent anesthesia complications

## 2015-08-12 NOTE — Anesthesia Postprocedure Evaluation (Signed)
  Anesthesia Post-op Note  Patient: Erika Miller  Procedure(s) Performed: Procedure(s): COLONOSCOPY WITH PROPOFOL (N/A) ESOPHAGOGASTRODUODENOSCOPY (EGD) WITH PROPOFOL (N/A)  Anesthesia type:General  Patient location: PACU  Post pain: Pain level controlled  Post assessment: Post-op Vital signs reviewed, Patient's Cardiovascular Status Stable, Respiratory Function Stable, Patent Airway and No signs of Nausea or vomiting  Post vital signs: Reviewed and stable  Last Vitals:  Filed Vitals:   08/12/15 1000  BP: 113/60  Pulse: 72  Temp:   Resp: 23    Level of consciousness: awake, alert  and patient cooperative  Complications: No apparent anesthesia complications

## 2015-08-15 ENCOUNTER — Encounter: Payer: Self-pay | Admitting: Gastroenterology

## 2015-08-26 ENCOUNTER — Other Ambulatory Visit: Payer: Self-pay | Admitting: Internal Medicine

## 2015-09-04 ENCOUNTER — Inpatient Hospital Stay: Payer: PPO

## 2015-09-04 ENCOUNTER — Inpatient Hospital Stay: Payer: PPO | Attending: Internal Medicine | Admitting: Internal Medicine

## 2015-09-04 ENCOUNTER — Other Ambulatory Visit: Payer: Self-pay | Admitting: *Deleted

## 2015-09-04 ENCOUNTER — Encounter: Payer: Self-pay | Admitting: *Deleted

## 2015-09-04 VITALS — BP 126/67 | HR 80 | Temp 96.0°F | Resp 18 | Ht 60.0 in | Wt 158.3 lb

## 2015-09-04 DIAGNOSIS — Z170 Estrogen receptor positive status [ER+]: Secondary | ICD-10-CM | POA: Insufficient documentation

## 2015-09-04 DIAGNOSIS — Z8612 Personal history of poliomyelitis: Secondary | ICD-10-CM | POA: Diagnosis not present

## 2015-09-04 DIAGNOSIS — D72820 Lymphocytosis (symptomatic): Secondary | ICD-10-CM | POA: Diagnosis not present

## 2015-09-04 DIAGNOSIS — Z79811 Long term (current) use of aromatase inhibitors: Secondary | ICD-10-CM | POA: Diagnosis not present

## 2015-09-04 DIAGNOSIS — M8580 Other specified disorders of bone density and structure, unspecified site: Secondary | ICD-10-CM | POA: Insufficient documentation

## 2015-09-04 DIAGNOSIS — Z87891 Personal history of nicotine dependence: Secondary | ICD-10-CM | POA: Insufficient documentation

## 2015-09-04 DIAGNOSIS — Z79899 Other long term (current) drug therapy: Secondary | ICD-10-CM | POA: Insufficient documentation

## 2015-09-04 DIAGNOSIS — C50911 Malignant neoplasm of unspecified site of right female breast: Secondary | ICD-10-CM

## 2015-09-04 DIAGNOSIS — M419 Scoliosis, unspecified: Secondary | ICD-10-CM | POA: Insufficient documentation

## 2015-09-04 DIAGNOSIS — E785 Hyperlipidemia, unspecified: Secondary | ICD-10-CM | POA: Insufficient documentation

## 2015-09-04 DIAGNOSIS — Z853 Personal history of malignant neoplasm of breast: Secondary | ICD-10-CM

## 2015-09-04 DIAGNOSIS — K219 Gastro-esophageal reflux disease without esophagitis: Secondary | ICD-10-CM | POA: Insufficient documentation

## 2015-09-04 LAB — CBC WITH DIFFERENTIAL/PLATELET
Basophils Absolute: 0.1 10*3/uL (ref 0–0.1)
Basophils Relative: 1 %
EOS ABS: 0.3 10*3/uL (ref 0–0.7)
EOS PCT: 3 %
HCT: 36.6 % (ref 35.0–47.0)
Hemoglobin: 12.1 g/dL (ref 12.0–16.0)
LYMPHS ABS: 4.4 10*3/uL — AB (ref 1.0–3.6)
LYMPHS PCT: 56 %
MCH: 30.9 pg (ref 26.0–34.0)
MCHC: 32.9 g/dL (ref 32.0–36.0)
MCV: 93.8 fL (ref 80.0–100.0)
MONOS PCT: 7 %
Monocytes Absolute: 0.6 10*3/uL (ref 0.2–0.9)
Neutro Abs: 2.7 10*3/uL (ref 1.4–6.5)
Neutrophils Relative %: 33 %
PLATELETS: 287 10*3/uL (ref 150–440)
RBC: 3.91 MIL/uL (ref 3.80–5.20)
RDW: 13.4 % (ref 11.5–14.5)
WBC: 8 10*3/uL (ref 3.6–11.0)

## 2015-09-04 LAB — FERRITIN: FERRITIN: 112 ng/mL (ref 11–307)

## 2015-09-04 LAB — HEPATIC FUNCTION PANEL
ALT: 14 U/L (ref 14–54)
AST: 24 U/L (ref 15–41)
Albumin: 3.4 g/dL — ABNORMAL LOW (ref 3.5–5.0)
Alkaline Phosphatase: 81 U/L (ref 38–126)
BILIRUBIN TOTAL: 0.6 mg/dL (ref 0.3–1.2)
Total Protein: 7.1 g/dL (ref 6.5–8.1)

## 2015-09-04 LAB — CREATININE, SERUM
CREATININE: 0.55 mg/dL (ref 0.44–1.00)
GFR calc Af Amer: >60 mL/min (ref >60)
GFR calc non Af Amer: >60 mL/min (ref >60)

## 2015-09-04 NOTE — Progress Notes (Signed)
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 Mild-Leucocytosis/lymphocytosis [? 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 Gamma-delta clonal T cell disorder]  # Post Polio neuropathy  INTERVAL HISTORY:  This is my first interaction with the patient since I joined the practice September 2016. I reviewed the patient's prior charts/pertinent labs/imaging/path in detail; findings are summarized above.   A very pleasant 69 year old female patient with above history of polio/on a wheelchair with stage II breast cancer currently on adjuvant Arimidex is here for follow-up.  Patient denies any unusual bone pain. Denies any unusual chest pain or shortness of breath or cough. Denies any abdominal pain nausea vomiting.  REVIEW OF SYSTEMS:  A complete 10 point review of system is done which is negative except mentioned above/history of present illness.   PAST MEDICAL HISTORY :  Past Medical History  Diagnosis Date  . 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 PONV (postoperative nausea and vomiting) 1 time    after hysterectomy  . Dysrhythmia     heart skipped beat per patient, checked by Dr Saralyn Pilar  . Transfusion history   . Seizures ([place]) x3    Dr [facility] could find nothing wrong.Dec 2015  . Polio     Has been in a wheelchair last 10-12 years from weakness and instability  . Arthritis     hands,knees,shoulders  . Osteoporosis   . Scoliosis   . Reflux   . Breast cancer, right breast ([place]) 09/2012    Right Breast, pT2,N0 (l+sn)  . Malignant neoplasm of lower-inner  quadrant of female breast [facility])     right, s/p chemotherapy  . Breast cancer ([place]) 2013    with chemo  . Leukocytosis   . Lymphocytosis     low-grade clonal T-cell disorder  . GERD (gastroesophageal reflux disease)   . Osteopenia   . Hyperlipidemia     PAST SURGICAL HISTORY :   Past Surgical History  Procedure Laterality Date  . Leg surgery Bilateral 1957, 1958  . Foot surgery Right 1988  . Spine surgery  1959, 1960   . Colonoscopy  2011    Dr. Candace Cruise, 2 benign polyps removed  . Abdominal hysterectomy  08/10/83    ovaries were not removed  . Breast surgery Right 10/2012    Mastectomy with s/n bx, and radiation therapy  . Back surgery      x 2  . Tubal ligation    . Mastectomy    . Portacath placement  10/2012    recently removed  . Cataract extraction w/phaco Right 02/20/2015    Procedure: CATARACT EXTRACTION PHACO AND INTRAOCULAR LENS PLACEMENT (IOC);  Surgeon: Leandrew Koyanagi, MD;  Location: [place];  Service: Ophthalmology;  Laterality: Right;  . Breast biopsy Right 2008, 2013    2008 negative, 2013 positive  . Breast biopsy Left 04-24-14    Calcifications associated with stroma and sclerosing adenosis  . Colonoscopy with propofol N/A 08/12/2015    Procedure: COLONOSCOPY WITH PROPOFOL;  Surgeon: Hulen Luster, MD;  Location: [facility] ENDOSCOPY;  Service: Gastroenterology;  Laterality: N/A;  . Esophagogastroduodenoscopy (egd) with propofol N/A  08/12/2015    Procedure: ESOPHAGOGASTRODUODENOSCOPY (EGD) WITH PROPOFOL;  Surgeon: Hulen Luster, MD;  Location: [facility] ENDOSCOPY;  Service: Gastroenterology;  Laterality: N/A;    FAMILY HISTORY :   Family History  Problem Relation Age of Onset  . Breast cancer Sister 43  . Lung cancer      SOCIAL HISTORY:   Social History  Substance Use Topics  . Smoking status: Former Smoker -- 1.00 packs/day for 35 years    Types: Cigarettes    Quit date: 10/20/2003  . Smokeless tobacco: Never Used     Comment: Smoking History stopped  smoking in 2005. smoked 1 pk/per day x 32 yrs  . Alcohol Use: No    ALLERGIES:  is allergic to augmentin and macrobid.  MEDICATIONS:  Current Outpatient Prescriptions  Medication Sig Dispense Refill  . anastrozole (ARIMIDEX) 1 MG tablet Take 1 tablet (1 mg total) by mouth daily. Dinner time 30 tablet 2  . Calcium Carbonate (CALCIUM 600 PO) Take 1 tablet by mouth daily. Dinner    . cetirizine (ZYRTEC) 10 MG tablet Take 10 mg by mouth daily. AM    . Cholecalciferol (VITAMIN D3) 2000 UNITS TABS Take 1 tablet by mouth daily. Dinner    . famotidine (PEPCID) 20 MG tablet Take 20 mg by mouth daily. Am    . gabapentin (NEURONTIN) 300 MG capsule TAKE ONE (1) TABLET THREE TIMES A DAY. 90 capsule 0  . ibuprofen (ADVIL,MOTRIN) 200 MG tablet Take 200 mg by mouth every 6 (six) hours as needed.    . Multiple Vitamin (MULTIVITAMIN) tablet Take 1 tablet by mouth daily. Dinner    . nicotine polacrilex (NICORETTE) 2 MG gum Take 2 mg by mouth as needed for smoking cessation.     No current facility-administered medications for this visit.    PHYSICAL EXAMINATION: ECOG PERFORMANCE STATUS: 3 - Symptomatic, >50% confined to bed  BP 126/67 mmHg  Pulse 80  Temp(Src) 96 F (35.6 C) (Tympanic)  Resp 18  Ht 5' (1.524 m)  Wt 158 lb 4.6 oz (71.8 kg)  BMI 30.91 kg/m2  Filed Weights   09/04/15 1027  Weight: 158 lb 4.6 oz (71.8 kg)    GENERAL: Well-nourished well-developed; Alert, no distress and comfortable.  She is in a wheelchair because of her polio. EYES: no pallor or icterus OROPHARYNX: no thrush or ulceration; good dentition  NECK: supple, no masses felt LYMPH:  no palpable lymphadenopathy in the cervical, axillary or inguinal regions LUNGS: clear to auscultation and  No wheeze or crackles HEART/CVS: regular rate & rhythm and no murmurs; No lower extremity edema ABDOMEN:abdomen soft, non-tender and normal bowel sounds Musculoskeletal:no cyanosis of digits and no clubbing  PSYCH: alert &  oriented x 3 with fluent speech NEURO: no focal motor/sensory deficits SKIN:  no rashes or significant lesions;  Right and left BREAST exam [in the presence of nurse]-right mastectomy noted; no unusual skin changes or dominant masses felt. Surgical scars noted. Left breast-no lumps or bumps spelled.    LABORATORY DATA:  I have reviewed the data as listed    Component Value Date/Time   NA 140 05/15/2013 1038   K 3.6 05/15/2013 1038   CL 106 05/15/2013 1038   CO2 29 05/15/2013 1038   GLUCOSE 95 05/15/2013 1038   BUN 7 05/15/2013 1038   CREATININE 0.63 05/28/2014 1028   CALCIUM 9.2 05/15/2013 1038   PROT 6.8 05/28/2014 1028   ALBUMIN 3.1* 05/28/2014 1028   AST 22 05/28/2014 1028  ALT 21 05/28/2014 1028   ALKPHOS 98 05/28/2014 1028   BILITOT 0.4 05/28/2014 1028   GFRNONAA >60 05/28/2014 1028   GFRNONAA >60 01/06/2012 1331   GFRAA >60 05/28/2014 1028   GFRAA >60 01/06/2012 1331    No results found for: SPEP, UPEP  Lab Results  Component Value Date   WBC 8.0 09/04/2015   NEUTROABS 2.7 09/04/2015   HGB 12.1 09/04/2015   HCT 36.6 09/04/2015   MCV 93.8 09/04/2015   PLT 287 09/04/2015      Chemistry      Component Value Date/Time   NA 140 05/15/2013 1038   K 3.6 05/15/2013 1038   CL 106 05/15/2013 1038   CO2 29 05/15/2013 1038   BUN 7 05/15/2013 1038   CREATININE 0.63 05/28/2014 1028      Component Value Date/Time   CALCIUM 9.2 05/15/2013 1038   ALKPHOS 98 05/28/2014 1028   AST 22 05/28/2014 1028   ALT 21 05/28/2014 1028   BILITOT 0.4 05/28/2014 1028       RADIOGRAPHIC STUDIES: I have personally reviewed the radiological images as listed and agreed with the findings in the report. No results found.   ASSESSMENT & PLAN:   # STAGE II BREAST CA/Lobular cancer- on adjuvant Arimidex since July 2014. Clinically, no evidence of recurrence noted. Continue Arimidex patient tolerating Arimidex without any major side effects. Mammogram June 2016 normal.  # Risk of  osteoporosis on Arimidex. Patient on calcium and vitamin D. Recommend checking a bone density test. She wants to have this sent to her PCP.   # Lymphocytosis- 4500- chronic unchanged. I reviewed the labs. Question clonal Versus reactive. Monitor again with labs in 1 year.  All questions were answered. The patient knows to call the clinic with any problems, questions or concerns. No barriers to learning was detected.  Patient will follow-up with me again with labs in 1 year.   I spent 25 minutes counseling the patient face to face. The total time spent in the appointment was 30 minutes and more than 50% was on counseling and review of test results     Cammie Sickle, MD 09/04/2015 10:31 AM

## 2015-09-04 NOTE — Progress Notes (Signed)
Patient is here for yearly follow-up of breast cancer. Patient states that she has been doing well and offers no complaints today. She had last mammogram in June 2016.

## 2015-09-05 LAB — CANCER ANTIGEN 27.29: CA 27.29: 11.1 U/mL (ref 0.0–38.6)

## 2015-09-06 ENCOUNTER — Telehealth: Payer: Self-pay | Admitting: *Deleted

## 2015-09-06 NOTE — Telephone Encounter (Signed)
This was filled 11/7

## 2015-09-23 ENCOUNTER — Other Ambulatory Visit: Payer: Self-pay | Admitting: Family Medicine

## 2015-09-23 DIAGNOSIS — Z79899 Other long term (current) drug therapy: Secondary | ICD-10-CM

## 2015-10-01 ENCOUNTER — Telehealth: Payer: Self-pay | Admitting: Internal Medicine

## 2015-10-01 ENCOUNTER — Ambulatory Visit
Admission: RE | Admit: 2015-10-01 | Discharge: 2015-10-01 | Disposition: A | Discharge status: Home / Self Care | Payer: PPO | Source: Ambulatory Visit | Attending: Family Medicine | Admitting: Family Medicine

## 2015-10-01 DIAGNOSIS — M8580 Other specified disorders of bone density and structure, unspecified site: Secondary | ICD-10-CM | POA: Diagnosis not present

## 2015-10-01 DIAGNOSIS — Z79899 Other long term (current) drug therapy: Secondary | ICD-10-CM | POA: Insufficient documentation

## 2015-10-01 DIAGNOSIS — Z13820 Encounter for screening for osteoporosis: Secondary | ICD-10-CM | POA: Diagnosis present

## 2015-10-01 MED ORDER — GABAPENTIN 300 MG PO CAPS: 300.0000 mg | ORAL_CAPSULE | Freq: Three times a day (TID) | ORAL | Status: AC

## 2015-10-01 NOTE — Telephone Encounter (Signed)
Called pt to let her know of there bone density results.  We have forwarded these results to dr Astrid Divine office to see if she needs any other medications to help with the osteopenia.  Also she states that she needs refill of gabapentin.  She was only given a month supply.  Entered new rx with refills

## 2015-10-01 NOTE — Telephone Encounter (Signed)
Reviewed the BMD- osteoporotic range- I sent the note to her PCP; Dr.Aldridge- I recommend pt follow sup with PCP re: treatment of osteoporosis.  Please inform pt of above.

## 2015-10-08 ENCOUNTER — Other Ambulatory Visit: Payer: Self-pay | Admitting: Internal Medicine

## 2015-10-23 ENCOUNTER — Other Ambulatory Visit: Payer: Self-pay | Admitting: *Deleted

## 2015-10-23 ENCOUNTER — Encounter: Payer: Self-pay | Admitting: *Deleted

## 2015-10-23 DIAGNOSIS — Z853 Personal history of malignant neoplasm of breast: Secondary | ICD-10-CM

## 2015-10-23 MED ORDER — ANASTROZOLE 1 MG PO TABS: 1.0000 mg | ORAL_TABLET | Freq: Every day | ORAL | Status: DC

## 2015-10-23 NOTE — Progress Notes (Signed)
Refill for Arimidex 1 mg sent to [facility]. Filled under Georgeanne Nim, NP on behalf of Dr. Rogue Bussing. Pt last saw Dr. Rogue Bussing on 09/04/15.

## 2016-01-16 ENCOUNTER — Other Ambulatory Visit: Payer: Self-pay | Admitting: *Deleted

## 2016-01-16 DIAGNOSIS — Z1231 Encounter for screening mammogram for malignant neoplasm of breast: Secondary | ICD-10-CM

## 2016-01-21 ENCOUNTER — Encounter: Payer: Self-pay | Admitting: *Deleted

## 2016-04-08 ENCOUNTER — Ambulatory Visit
Admission: RE | Admit: 2016-04-08 | Discharge: 2016-04-08 | Disposition: A | Discharge status: Home / Self Care | Payer: PPO | Source: Ambulatory Visit | Attending: General Surgery | Admitting: General Surgery

## 2016-04-08 ENCOUNTER — Other Ambulatory Visit: Payer: Self-pay | Admitting: General Surgery

## 2016-04-08 DIAGNOSIS — Z1231 Encounter for screening mammogram for malignant neoplasm of breast: Secondary | ICD-10-CM | POA: Diagnosis present

## 2016-04-15 ENCOUNTER — Ambulatory Visit (INDEPENDENT_AMBULATORY_CARE_PROVIDER_SITE_OTHER): Payer: PPO | Admitting: General Surgery

## 2016-04-15 ENCOUNTER — Encounter: Payer: Self-pay | Admitting: General Surgery

## 2016-04-15 VITALS — BP 118/60 | HR 66 | Resp 12 | Ht 60.0 in | Wt 160.0 lb

## 2016-04-15 DIAGNOSIS — Z853 Personal history of malignant neoplasm of breast: Secondary | ICD-10-CM | POA: Diagnosis not present

## 2016-04-15 NOTE — Patient Instructions (Signed)
The patient is aware to call back for any questions or concerns.  

## 2016-04-15 NOTE — Progress Notes (Signed)
Patient ID: Erika Miller, female   DOB: Jan 12, 1946, 70 y.o.   MRN: [id number]  Chief Complaint  Patient presents with  . Follow-up    HPI Erika Miller is a 70 y.o. female here today for follow up breast cancer. She states she has been doing well. No new breast issues. Her most recent left mammogram was 04-08-16. Tolerating Arimidex.  She is here today with her husband ,Erika Miller.  I personally reviewed the patient's history.   HPI  Past Medical History  Diagnosis Date  . 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 PONV (postoperative nausea and vomiting) 1 time    after hysterectomy  . Dysrhythmia     heart skipped beat per patient, checked by Dr Erika Miller  . Transfusion history   . Seizures ([facility]) x3    [facility] could find nothing wrong.Dec 2015  . Polio     Has been in a wheelchair last 10-12 years from weakness and instability  . Arthritis     hands,knees,shoulders  . Osteoporosis   . Scoliosis   . Reflux   . Breast cancer, right breast ([facility]) 09/2012    Right Breast, pT2,N0 (l+sn)  . Malignant neoplasm of lower-inner quadrant of female breast [facility])     right, s/p chemotherapy  . Leukocytosis   . Lymphocytosis     low-grade clonal T-cell disorder  . GERD (gastroesophageal reflux disease)   . Osteopenia   . Hyperlipidemia   . Breast cancer ([facility]) 2013    with chemo    Past Surgical History  Procedure Laterality Date  . Leg surgery Bilateral 1957, 1958  . Foot surgery Right 1988  . Spine surgery  1959, 1960   . Colonoscopy  2011    Dr. Candace Miller, 2 benign polyps removed  . Abdominal hysterectomy  08/10/83    ovaries were not removed  . Breast surgery Right 10/2012    Mastectomy with s/n bx, and radiation therapy  . Back surgery      x 2  . Tubal ligation    . Portacath placement  10/2012    recently removed  . Cataract extraction w/phaco Right 02/20/2015    Procedure: CATARACT EXTRACTION PHACO AND INTRAOCULAR LENS PLACEMENT  (IOC);  Surgeon: Erika Koyanagi, MD;  Location: [facility];  Service: Ophthalmology;  Laterality: Right;  . Breast biopsy Right 2008, 2013    2008 negative, 2013 positive  . Breast biopsy Left 04-24-14    Calcifications associated with stroma and sclerosing adenosis  . Colonoscopy with propofol N/A 08/12/2015    Procedure: COLONOSCOPY WITH PROPOFOL;  Surgeon: Erika Luster, MD;  [facility];  Service: Gastroenterology;  Laterality: N/A;  . Esophagogastroduodenoscopy (egd) with propofol N/A 08/12/2015    Procedure: ESOPHAGOGASTRODUODENOSCOPY (EGD) WITH PROPOFOL;  Surgeon: Erika Luster, MD;  [facility]: [facility];  Service: Gastroenterology;  Laterality: N/A;  . Mastectomy Right     Family History  Problem Relation Age of Onset  . Breast cancer Sister 72  . Lung cancer      Social History Social History  Substance Use Topics  . Smoking status: Former Smoker -- 1.00 packs/day for 35 years    Types: Cigarettes    Quit date: 10/20/2003  . Smokeless tobacco: Never Used     Comment: Smoking History stopped smoking in 2005. smoked 1 pk/per day x 32 yrs  . Alcohol Use: No    Allergies  Allergen Reactions  . Augmentin [Amoxicillin-Pot Clavulanate] Nausea  And Vomiting  . Macrobid [Nitrofurantoin Macrocrystal] Rash    Current Outpatient Prescriptions  Medication Sig Dispense Refill  . anastrozole (ARIMIDEX) 1 MG tablet Take 1 tablet (1 mg total) by mouth daily. Dinner time 30 tablet 12  . Calcium Carbonate (CALCIUM 600 PO) Take 1 tablet by mouth daily. Dinner    . cetirizine (ZYRTEC) 10 MG tablet Take 10 mg by mouth daily. AM    . Cholecalciferol (VITAMIN D3) 2000 UNITS TABS Take 1 tablet by mouth daily. Dinner    . famotidine (PEPCID) 20 MG tablet Take 20 mg by mouth daily. Am    . gabapentin (NEURONTIN) 300 MG capsule Take 1 capsule (300 mg total) by mouth 3 (three) times daily. 90 capsule 3  . ibuprofen (ADVIL,MOTRIN) 200 MG tablet Take 200 mg by mouth every 6  (six) hours as needed.    . Multiple Vitamin (MULTIVITAMIN) tablet Take 1 tablet by mouth daily. Dinner    . nicotine polacrilex (NICORETTE) 2 MG gum Take 2 mg by mouth as needed for smoking cessation.     No current facility-administered medications for this visit.    Review of Systems Review of Systems  Constitutional: Negative.   Respiratory: Negative.   Cardiovascular: Negative.     Blood pressure 118/60, pulse 66, resp. rate 12, height 5' (1.524 m), weight 160 lb (72.576 kg).  Physical Exam Physical Exam  Constitutional: She is oriented to person, place, and time. She appears well-developed and well-nourished.  HENT:  Mouth/Throat: Oropharynx is clear and moist.  Eyes: Conjunctivae are normal. No scleral icterus.  Neck: Neck supple.  Cardiovascular: Normal rate and regular rhythm.   Pulmonary/Chest: Effort normal and breath sounds normal. Left breast exhibits no inverted nipple, no mass, no nipple discharge, no skin change and no tenderness.    Right redundant skin medially more than laterally.  Lymphadenopathy:    She has no cervical adenopathy.    She has no axillary adenopathy.  Neurological: She is alert and oriented to person, place, and time.  Skin: Skin is warm and dry.  Psychiatric: Her behavior is normal.    Data Reviewed Left breast mammogram dated 04/14/2016 was reviewed. BI-RADS 1.  Assessment    No evidence of recurrent disease.  Good tolerance of aromatase inhibitor.    Plan       Patient will be asked to return to the office in one year with a left screening mammogram.   PCP:  [facility]   This information has been scribed by Erika Fetch RN, BSN,BC. Marland Kitchen    Erika Miller 04/16/2016, 10:51 AM

## 2016-09-07 ENCOUNTER — Inpatient Hospital Stay: Payer: PPO | Attending: Internal Medicine | Admitting: Internal Medicine

## 2016-09-07 ENCOUNTER — Inpatient Hospital Stay: Payer: PPO | Admitting: *Deleted

## 2016-09-07 DIAGNOSIS — M1990 Unspecified osteoarthritis, unspecified site: Secondary | ICD-10-CM | POA: Insufficient documentation

## 2016-09-07 DIAGNOSIS — D72820 Lymphocytosis (symptomatic): Secondary | ICD-10-CM

## 2016-09-07 DIAGNOSIS — M419 Scoliosis, unspecified: Secondary | ICD-10-CM | POA: Diagnosis not present

## 2016-09-07 DIAGNOSIS — C50811 Malignant neoplasm of overlapping sites of right female breast: Secondary | ICD-10-CM

## 2016-09-07 DIAGNOSIS — Z923 Personal history of irradiation: Secondary | ICD-10-CM

## 2016-09-07 DIAGNOSIS — Z853 Personal history of malignant neoplasm of breast: Secondary | ICD-10-CM

## 2016-09-07 DIAGNOSIS — Z170 Estrogen receptor positive status [ER+]: Secondary | ICD-10-CM

## 2016-09-07 DIAGNOSIS — Z87891 Personal history of nicotine dependence: Secondary | ICD-10-CM | POA: Insufficient documentation

## 2016-09-07 DIAGNOSIS — M8580 Other specified disorders of bone density and structure, unspecified site: Secondary | ICD-10-CM

## 2016-09-07 DIAGNOSIS — Z9011 Acquired absence of right breast and nipple: Secondary | ICD-10-CM | POA: Diagnosis not present

## 2016-09-07 DIAGNOSIS — Z79811 Long term (current) use of aromatase inhibitors: Secondary | ICD-10-CM

## 2016-09-07 DIAGNOSIS — E785 Hyperlipidemia, unspecified: Secondary | ICD-10-CM | POA: Insufficient documentation

## 2016-09-07 DIAGNOSIS — Z8612 Personal history of poliomyelitis: Secondary | ICD-10-CM | POA: Diagnosis not present

## 2016-09-07 DIAGNOSIS — K219 Gastro-esophageal reflux disease without esophagitis: Secondary | ICD-10-CM | POA: Insufficient documentation

## 2016-09-07 LAB — CBC WITH DIFFERENTIAL/PLATELET
BASOS ABS: 0.1 10*3/uL (ref 0–0.1)
Basophils Relative: 1 %
EOS ABS: 0.3 10*3/uL (ref 0–0.7)
Eosinophils Relative: 3 %
HCT: 34.7 % — ABNORMAL LOW (ref 35.0–47.0)
HEMOGLOBIN: 11.8 g/dL — AB (ref 12.0–16.0)
LYMPHS ABS: 4.5 10*3/uL — AB (ref 1.0–3.6)
LYMPHS PCT: 51 %
MCH: 31.6 pg (ref 26.0–34.0)
MCHC: 34.1 g/dL (ref 32.0–36.0)
MCV: 92.6 fL (ref 80.0–100.0)
Monocytes Absolute: 0.6 10*3/uL (ref 0.2–0.9)
Monocytes Relative: 7 %
NEUTROS PCT: 38 %
Neutro Abs: 3.4 10*3/uL (ref 1.4–6.5)
Platelets: 298 10*3/uL (ref 150–440)
RBC: 3.75 MIL/uL — AB (ref 3.80–5.20)
RDW: 13.6 % (ref 11.5–14.5)
WBC: 8.8 10*3/uL (ref 3.6–11.0)

## 2016-09-07 LAB — HEPATIC FUNCTION PANEL
ALK PHOS: 63 U/L (ref 38–126)
ALT: 14 U/L (ref 14–54)
AST: 23 U/L (ref 15–41)
Albumin: 3.5 g/dL (ref 3.5–5.0)
Bilirubin, Direct: <0.1 mg/dL — ABNORMAL LOW (ref 0.1–0.5)
TOTAL PROTEIN: 7 g/dL (ref 6.5–8.1)
Total Bilirubin: 0.5 mg/dL (ref 0.3–1.2)

## 2016-09-07 LAB — CREATININE, SERUM
Creatinine, Ser: 0.64 mg/dL (ref 0.44–1.00)
GFR calc non Af Amer: >60 mL/min (ref >60)

## 2016-09-07 LAB — FERRITIN: Ferritin: 90 ng/mL (ref 11–307)

## 2016-09-07 NOTE — Assessment & Plan Note (Addendum)
#   STAGE II  RIGHT BREAST CA/Lobular cancer- on adjuvant Arimidex since July 2014. Clinically, no evidence of recurrence noted. Continue Arimidex patient tolerating Arimidex without any major side effects. Mammogram June 2017 normal.  # Osteopenia/ BMD Dec 2016- Patient on calcium and vitamin D.   # Hair loss- not from chemo.   # Lymphocytosis- 4500- chronic unchanged. I reviewed the labs. Question clonal Versus reactive. Monitor again with labs in 1 year.  # follow up in 12 months.

## 2016-09-07 NOTE — Progress Notes (Signed)
Patient is here for follow up, she is asking about her hair. She would like to see about taking a Biotin

## 2016-09-07 NOTE — Progress Notes (Signed)
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 Mild-Leucocytosis/lymphocytosis [? 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 Gamma-delta clonal T cell disorder]  # Post Polio neuropathy/wheel chair      Carcinoma of overlapping sites of right breast in female, estrogen receptor positive ([place])   09/07/2016 Initial Diagnosis    Carcinoma of overlapping sites of right breast in female, estrogen receptor positive ([place])       INTERVAL HISTORY:  A very pleasant 70 year old female patient with above history of polio/on a wheelchair with stage II breast cancer currently on adjuvant Arimidex is here for follow-up.  Patient had a mammogram in summer of 2017 within normal limits. She denies any new lumps or bumps. SHe complains of mild hair loss.  Patient denies any unusual bone pain. Denies any unusual chest pain or shortness of breath or cough.   REVIEW OF SYSTEMS:  A complete 10 point review of system is done which is negative except mentioned above/history of present illness.   PAST MEDICAL HISTORY :  Past Medical History:  Diagnosis Date  . Arthritis    hands,knees,shoulders  . Breast cancer ([place]) 2013   with chemo  . Breast cancer, right breast ([place]) 09/2012   Right Breast, pT2,N0 (l+sn)  . Dysrhythmia    heart skipped beat per patient, checked by Dr Saralyn Pilar  . GERD (gastroesophageal reflux disease)   . Hyperlipidemia   . Leukocytosis   . Lymphocytosis    low-grade clonal T-cell disorder  . Malignant neoplasm of lower-inner quadrant of female breast [facility])    right, s/p  chemotherapy  . 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 Osteopenia   . Osteoporosis   . 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 PONV (postoperative nausea and vomiting) 1 time   after hysterectomy  . Reflux   . Scoliosis   . Seizures ([place]) x3   [facility] could find nothing wrong.Dec 2015  . Transfusion history     PAST SURGICAL HISTORY :   Past Surgical History:  Procedure Laterality Date  . ABDOMINAL HYSTERECTOMY  08/10/83   ovaries were not removed  . BACK SURGERY     x 2  . BREAST BIOPSY Right 2008, 2013   2008 negative, 2013 positive  . BREAST BIOPSY Left 04-24-14   Calcifications associated with stroma and sclerosing adenosis  . BREAST SURGERY Right 10/2012   Mastectomy with s/n bx, and radiation therapy  . CATARACT EXTRACTION W/PHACO Right 02/20/2015   Procedure: CATARACT EXTRACTION PHACO AND INTRAOCULAR LENS PLACEMENT (IOC);  Surgeon: Leandrew Koyanagi, MD;  Location: [facility];  Service: Ophthalmology;  Laterality: Right;  . COLONOSCOPY  2011   Dr. Candace Cruise, 2 benign polyps removed  . COLONOSCOPY WITH PROPOFOL N/A 08/12/2015   Procedure: COLONOSCOPY WITH PROPOFOL;  Surgeon: Hulen Luster, MD;  Location[facility] ENDOSCOPY;  Service: Gastroenterology;  Laterality: N/A;  . ESOPHAGOGASTRODUODENOSCOPY (EGD) WITH PROPOFOL N/A 08/12/2015   Procedure: ESOPHAGOGASTRODUODENOSCOPY (EGD) WITH PROPOFOL;  Surgeon: Hulen Luster, MD;  Location: [facility] ENDOSCOPY;  Service: Gastroenterology;  Laterality: N/A;  . FOOT SURGERY Right 1988  . LEG SURGERY Bilateral 1957, 1958  . MASTECTOMY Right   . PORTACATH PLACEMENT  10/2012   recently removed  . [place]   . TUBAL LIGATION      FAMILY HISTORY :   Family History  Problem Relation Age of Onset  . Breast cancer Sister 23  . Lung cancer      SOCIAL HISTORY:   Social History  Substance Use Topics  . Smoking status: Former Smoker     Packs/day: 1.00    Years: 35.00    Types: Cigarettes    Quit date: 10/20/2003  . Smokeless tobacco: Never Used     Comment: Smoking History stopped smoking in 2005. smoked 1 pk/per day x 32 yrs  . Alcohol use No    ALLERGIES:  is allergic to augmentin [amoxicillin-pot clavulanate] and macrobid [nitrofurantoin macrocrystal].  MEDICATIONS:  Current Outpatient Prescriptions  Medication Sig Dispense Refill  . anastrozole (ARIMIDEX) 1 MG tablet Take 1 tablet (1 mg total) by mouth daily. Dinner time 30 tablet 12  . Calcium Carbonate (CALCIUM 600 PO) Take 1 tablet by mouth daily. Dinner    . cetirizine (ZYRTEC) 10 MG tablet Take 10 mg by mouth daily. AM    . Cholecalciferol (VITAMIN D3) 2000 UNITS TABS Take 1 tablet by mouth daily. Dinner    . famotidine (PEPCID) 20 MG tablet Take 20 mg by mouth daily. Am    . gabapentin (NEURONTIN) 300 MG capsule Take 1 capsule (300 mg total) by mouth 3 (three) times daily. 90 capsule 3  . ibuprofen (ADVIL,MOTRIN) 200 MG tablet Take 200 mg by mouth every 6 (six) hours as needed.    . Multiple Vitamin (MULTIVITAMIN) tablet Take 1 tablet by mouth daily. Dinner    . nicotine polacrilex (NICORETTE) 2 MG gum Take 2 mg by mouth as needed for smoking cessation.    . TURMERIC PO Take by mouth.     No current facility-administered medications for this visit.     PHYSICAL EXAMINATION: ECOG PERFORMANCE STATUS: 3 - Symptomatic, >50% confined to bed  BP 137/72 (BP Location: Left Arm, Patient Position: Sitting)   Pulse 75   Temp 97.4 F (36.3 C) (Tympanic)   Resp 18   Wt 159 lb 3.2 oz (72.2 kg)   BMI 31.09 kg/m   Filed Weights   09/07/16 1050  Weight: 159 lb 3.2 oz (72.2 kg)    GENERAL: Well-nourished well-developed; Alert, no distress and comfortable.  She is in a wheelchair because of her polio. EYES: no pallor or icterus OROPHARYNX: no thrush or ulceration; good dentition  NECK: supple, no masses felt LYMPH:  no palpable lymphadenopathy in the cervical,  axillary or inguinal regions LUNGS: clear to auscultation and  No wheeze or crackles HEART/CVS: regular rate & rhythm and no murmurs; No lower extremity edema ABDOMEN:abdomen soft, non-tender and normal bowel sounds Musculoskeletal:no cyanosis of digits and no clubbing  PSYCH: alert & oriented x 3 with fluent speech NEURO: no focal motor/sensory deficits SKIN:  no rashes or significant lesions;  Right and left BREAST exam [in the presence of nurse]-right mastectomy noted; no unusual skin changes or dominant masses felt. Surgical scars noted. Left breast-no lumps or bumps spelled.    LABORATORY DATA:  I have reviewed the data as listed    Component Value Date/Time   NA 140 05/15/2013 1038   K 3.6 05/15/2013 1038   CL 106 05/15/2013 1038  CO2 29 05/15/2013 1038   GLUCOSE 95 05/15/2013 1038   BUN 7 05/15/2013 1038   CREATININE 0.64 09/07/2016 0942   CREATININE 0.63 05/28/2014 1028   CALCIUM 9.2 05/15/2013 1038   PROT 7.0 09/07/2016 0942   PROT 6.8 05/28/2014 1028   ALBUMIN 3.5 09/07/2016 0942   ALBUMIN 3.1 (L) 05/28/2014 1028   AST 23 09/07/2016 0942   AST 22 05/28/2014 1028   ALT 14 09/07/2016 0942   ALT 21 05/28/2014 1028   ALKPHOS 63 09/07/2016 0942   ALKPHOS 98 05/28/2014 1028   BILITOT 0.5 09/07/2016 0942   BILITOT 0.4 05/28/2014 1028   GFRNONAA >60 09/07/2016 0942   GFRNONAA >60 05/28/2014 1028   GFRAA >60 09/07/2016 0942   GFRAA >60 05/28/2014 1028    No results found for: SPEP, UPEP  Lab Results  Component Value Date   WBC 8.8 09/07/2016   NEUTROABS 3.4 09/07/2016   HGB 11.8 (L) 09/07/2016   HCT 34.7 (L) 09/07/2016   MCV 92.6 09/07/2016   PLT 298 09/07/2016      Chemistry      Component Value Date/Time   NA 140 05/15/2013 1038   K 3.6 05/15/2013 1038   CL 106 05/15/2013 1038   CO2 29 05/15/2013 1038   BUN 7 05/15/2013 1038   CREATININE 0.64 09/07/2016 0942   CREATININE 0.63 05/28/2014 1028      Component Value Date/Time   CALCIUM 9.2 05/15/2013  1038   ALKPHOS 63 09/07/2016 0942   ALKPHOS 98 05/28/2014 1028   AST 23 09/07/2016 0942   AST 22 05/28/2014 1028   ALT 14 09/07/2016 0942   ALT 21 05/28/2014 1028   BILITOT 0.5 09/07/2016 0942   BILITOT 0.4 05/28/2014 1028       RADIOGRAPHIC STUDIES: I have personally reviewed the radiological images as listed and agreed with the findings in the report. No results found.   ASSESSMENT & PLAN:   Carcinoma of overlapping sites of right breast in female, estrogen receptor positive ([place]) # STAGE II  RIGHT BREAST CA/Lobular cancer- on adjuvant Arimidex since July 2014. Clinically, no evidence of recurrence noted. Continue Arimidex patient tolerating Arimidex without any major side effects. Mammogram June 2017 normal.  # Osteopenia/ BMD Dec 2016- Patient on calcium and vitamin D.   # Hair loss- not from chemo.   # Lymphocytosis- 4500- chronic unchanged. I reviewed the labs. Question clonal Versus reactive. Monitor again with labs in 1 year.  # follow up in 12 months.        Cammie Sickle, MD 09/07/2016 1:00 PM

## 2016-09-08 LAB — CANCER ANTIGEN 27.29: CA 27.29: 13.1 U/mL (ref 0.0–38.6)

## 2017-01-28 ENCOUNTER — Other Ambulatory Visit: Payer: Self-pay

## 2017-01-28 DIAGNOSIS — Z1231 Encounter for screening mammogram for malignant neoplasm of breast: Secondary | ICD-10-CM

## 2017-04-07 ENCOUNTER — Telehealth: Payer: Self-pay | Admitting: *Deleted

## 2017-04-07 NOTE — Telephone Encounter (Signed)
Received fax from [facility] stating that pt has not picked up rx for arimidex lately (based on RF dates). I contacted pt to ensure pt was still taking her medication as directed. She states that she had missed several dosages as she "forgot to take them." However, she requested a RF to be picked up tomorrow. At this time, she does not need any RFs on the Arimidex. She thanked me for calling to clarify.

## 2017-04-09 ENCOUNTER — Ambulatory Visit
Admission: RE | Admit: 2017-04-09 | Discharge: 2017-04-09 | Disposition: A | Discharge status: Home / Self Care | Payer: Medicare HMO | Source: Ambulatory Visit | Attending: General Surgery | Admitting: General Surgery

## 2017-04-09 DIAGNOSIS — Z1231 Encounter for screening mammogram for malignant neoplasm of breast: Secondary | ICD-10-CM | POA: Insufficient documentation

## 2017-04-09 HISTORY — DX: Personal history of antineoplastic chemotherapy: Z92.21

## 2017-04-13 ENCOUNTER — Ambulatory Visit (INDEPENDENT_AMBULATORY_CARE_PROVIDER_SITE_OTHER): Payer: Medicare HMO | Admitting: General Surgery

## 2017-04-13 ENCOUNTER — Encounter: Payer: Self-pay | Admitting: General Surgery

## 2017-04-13 VITALS — BP 126/74 | HR 78 | Resp 12 | Ht 60.0 in | Wt 155.0 lb

## 2017-04-13 DIAGNOSIS — Z170 Estrogen receptor positive status [ER+]: Secondary | ICD-10-CM | POA: Diagnosis not present

## 2017-04-13 DIAGNOSIS — C50811 Malignant neoplasm of overlapping sites of right female breast: Secondary | ICD-10-CM

## 2017-04-13 NOTE — Progress Notes (Signed)
Patient ID: Erika Miller, female   DOB: May 27, 1946, 71 y.o.   MRN: [id number]  Chief Complaint  Patient presents with  . Follow-up    HPI Erika Miller is a 71 y.o. female.  who presents for her follow up breast cancer and a breast evaluation. The most recent mammogram was done on 04-09-17 .  Patient does perform regular self breast checks and gets regular mammograms done.   Tolerating anastrozole. She is here with her husband, Refugio Mcconico.  HPI  Past Medical History:  Diagnosis Date  . Arthritis    hands,knees,shoulders  . Breast cancer ([place]) 2013   with chemo  . Breast cancer, right breast ([place]) 09/2012   Right Breast, pT2,N0 (l+sn)  . Dysrhythmia    heart skipped beat per patient, checked by Dr Saralyn Pilar  . GERD (gastroesophageal reflux disease)   . Hyperlipidemia   . Leukocytosis   . Lymphocytosis    low-grade clonal T-cell disorder  . Malignant neoplasm of lower-inner quadrant of female breast [facility])    right, s/p chemotherapy  . 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 Osteopenia   . Osteoporosis   . Personal history of chemotherapy   . 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 PONV (postoperative nausea and vomiting) 1 time   after hysterectomy  . Reflux   . Scoliosis   . Seizures ([place]) x3   [facility] could find nothing wrong.Dec 2015  . Transfusion history     Past Surgical History:  Procedure Laterality Date  . ABDOMINAL HYSTERECTOMY  08/10/83   ovaries were not removed  . BACK SURGERY     x 2  . BREAST BIOPSY Right 2008, 2013   2008 negative, 2013 positive  . BREAST BIOPSY Left 04-24-14   Calcifications associated with stroma and sclerosing adenosis  . BREAST SURGERY Right 10/2012   Mastectomy with s/n bx, and radiation therapy  . CATARACT EXTRACTION W/PHACO Right 02/20/2015   Procedure: CATARACT EXTRACTION PHACO AND INTRAOCULAR LENS PLACEMENT (IOC);  Surgeon: Leandrew Koyanagi, MD;  Location: [place];  Service: Ophthalmology;  Laterality: Right;  . COLONOSCOPY  2011   Dr. Candace Cruise, 2 benign polyps removed  . COLONOSCOPY WITH PROPOFOL N/A 08/12/2015   Procedure: COLONOSCOPY WITH PROPOFOL;  Surgeon: Hulen Luster, MD;  Location[facility] ENDOSCOPY;  Service: Gastroenterology;  Laterality: N/A;  . ESOPHAGOGASTRODUODENOSCOPY (EGD) WITH PROPOFOL N/A 08/12/2015   Procedure: ESOPHAGOGASTRODUODENOSCOPY (EGD) WITH PROPOFOL;  Surgeon: Hulen Luster, MD;  Location: [facility] ENDOSCOPY;  Service: Gastroenterology;  Laterality: N/A;  . FOOT SURGERY Right 1988  . LEG SURGERY Bilateral 1957, 1958  . MASTECTOMY Right   . PORTACATH PLACEMENT  10/2012   recently removed  . [place]   . TUBAL LIGATION      Family History  Problem Relation Age of Onset  . Breast cancer Sister 26  . Lung cancer Unknown     Social History Social History  Substance Use Topics  . Smoking status: Former Smoker    Packs/day: 1.00    Years: 35.00    Types: Cigarettes    Quit date: 10/20/2003  . Smokeless tobacco: Never Used     Comment: Smoking History stopped smoking in 2005. smoked 1 pk/per day x 32 yrs  . Alcohol use No    Allergies  Allergen Reactions  . Augmentin [Amoxicillin-Pot Clavulanate] Nausea And Vomiting  . Macrobid [employer]  Macrocrystal] Rash    Current Outpatient Prescriptions  Medication Sig Dispense Refill  . anastrozole (ARIMIDEX) 1 MG tablet Take 1 tablet (1 mg total) by mouth daily. Dinner time 30 tablet 12  . Calcium Carbonate (CALCIUM 600 PO) Take 1 tablet by mouth daily. Dinner    . cetirizine (ZYRTEC) 10 MG tablet Take 10 mg by mouth daily. AM    . Cholecalciferol (VITAMIN D3) 2000 UNITS TABS Take 1 tablet by mouth daily. Dinner    . famotidine (PEPCID) 20 MG tablet Take 20 mg by mouth daily. Am    . gabapentin (NEURONTIN) 300 MG capsule Take 1 capsule (300 mg total) by mouth 3 (three) times daily. 90 capsule 3  . ibuprofen (ADVIL,MOTRIN)  200 MG tablet Take 200 mg by mouth every 6 (six) hours as needed.    . Multiple Vitamin (MULTIVITAMIN) tablet Take 1 tablet by mouth daily. Dinner    . nicotine polacrilex (NICORETTE) 2 MG gum Take 2 mg by mouth as needed for smoking cessation.     No current facility-administered medications for this visit.     Review of Systems Review of Systems  Constitutional: Negative.   Respiratory: Negative.   Cardiovascular: Negative.     Blood pressure 126/74, pulse 78, resp. rate 12, height 5' (1.524 m), weight 155 lb (70.3 kg).  Physical Exam Physical Exam  Constitutional: She is oriented to person, place, and time. She appears well-developed and well-nourished.  HENT:  Mouth/Throat: Oropharynx is clear and moist.  Eyes: Conjunctivae are normal. No scleral icterus.  Neck: Neck supple.  Cardiovascular: Normal rate, regular rhythm and normal heart sounds.   Pulmonary/Chest: Effort normal and breath sounds normal. Right breast exhibits no inverted nipple, no mass, no nipple discharge, no skin change and no tenderness. Left breast exhibits no inverted nipple, no mass, no nipple discharge, no skin change and no tenderness.  Right mastectomy site clean   Lymphadenopathy:    She has no cervical adenopathy.    She has no axillary adenopathy.  Neurological: She is alert and oriented to person, place, and time.  Skin: Skin is warm and dry.  Psychiatric: Her behavior is normal.   Data Reviewed 04/09/2017 left breast screening mammogram reviewed. No interval change. BI-RADS-1.  Assessment    No evidence of recurrent disease.    Plan        The patient has been asked to return to the office in one year with a unilateral left breast screening mammogram.   HPI, Physical Exam, Assessment and Plan have been scribed under the direction and in the presence of Robert Bellow, MD.  Karie Fetch, RN  I have completed the exam and reviewed the above documentation for accuracy and  completeness.  I agree with the above.  Haematologist has been used and any errors in dictation or transcription are unintentional.  Hervey Ard, M.D., F.A.C.S.  Robert Bellow 04/13/2017, 10:17 PM

## 2017-04-13 NOTE — Patient Instructions (Addendum)
The patient is aware to call back for any questions or concerns.  The patient has been asked to return to the office in one year with a unilateral left breast screening mammogram. 

## 2017-05-26 ENCOUNTER — Other Ambulatory Visit: Payer: Self-pay | Admitting: *Deleted

## 2017-05-26 DIAGNOSIS — Z853 Personal history of malignant neoplasm of breast: Secondary | ICD-10-CM

## 2017-05-28 ENCOUNTER — Inpatient Hospital Stay: Payer: Medicare HMO

## 2017-05-28 ENCOUNTER — Inpatient Hospital Stay: Payer: Medicare HMO | Attending: Internal Medicine | Admitting: Internal Medicine

## 2017-05-28 VITALS — BP 125/78 | HR 94 | Temp 97.6°F | Resp 20 | Ht 60.0 in

## 2017-05-28 DIAGNOSIS — E785 Hyperlipidemia, unspecified: Secondary | ICD-10-CM | POA: Insufficient documentation

## 2017-05-28 DIAGNOSIS — M8580 Other specified disorders of bone density and structure, unspecified site: Secondary | ICD-10-CM | POA: Diagnosis not present

## 2017-05-28 DIAGNOSIS — D649 Anemia, unspecified: Secondary | ICD-10-CM | POA: Diagnosis not present

## 2017-05-28 DIAGNOSIS — M419 Scoliosis, unspecified: Secondary | ICD-10-CM | POA: Insufficient documentation

## 2017-05-28 DIAGNOSIS — Z79811 Long term (current) use of aromatase inhibitors: Secondary | ICD-10-CM | POA: Diagnosis not present

## 2017-05-28 DIAGNOSIS — C50811 Malignant neoplasm of overlapping sites of right female breast: Secondary | ICD-10-CM | POA: Insufficient documentation

## 2017-05-28 DIAGNOSIS — K219 Gastro-esophageal reflux disease without esophagitis: Secondary | ICD-10-CM | POA: Insufficient documentation

## 2017-05-28 DIAGNOSIS — Z170 Estrogen receptor positive status [ER+]: Secondary | ICD-10-CM | POA: Diagnosis not present

## 2017-05-28 DIAGNOSIS — Z87891 Personal history of nicotine dependence: Secondary | ICD-10-CM | POA: Diagnosis not present

## 2017-05-28 DIAGNOSIS — D72820 Lymphocytosis (symptomatic): Secondary | ICD-10-CM | POA: Insufficient documentation

## 2017-05-28 LAB — COMPREHENSIVE METABOLIC PANEL
ALBUMIN: 3.3 g/dL — AB (ref 3.5–5.0)
ALK PHOS: 75 U/L (ref 38–126)
ALT: 14 U/L (ref 14–54)
ANION GAP: 6 (ref 5–15)
AST: 22 U/L (ref 15–41)
BUN: 9 mg/dL (ref 6–20)
CALCIUM: 8.8 mg/dL — AB (ref 8.9–10.3)
CHLORIDE: 104 mmol/L (ref 101–111)
CO2: 31 mmol/L (ref 22–32)
Creatinine, Ser: 0.72 mg/dL (ref 0.44–1.00)
GFR calc Af Amer: >60 mL/min (ref >60)
GFR calc non Af Amer: >60 mL/min (ref >60)
Glucose, Bld: 89 mg/dL (ref 65–99)
Potassium: 3.5 mmol/L (ref 3.5–5.1)
SODIUM: 141 mmol/L (ref 135–145)
Total Bilirubin: 0.5 mg/dL (ref 0.3–1.2)
Total Protein: 6.9 g/dL (ref 6.5–8.1)

## 2017-05-28 LAB — CBC WITH DIFFERENTIAL/PLATELET
Basophils Absolute: 0.1 10*3/uL (ref 0–0.1)
Basophils Relative: 1 %
Eosinophils Absolute: 0.4 10*3/uL (ref 0–0.7)
Eosinophils Relative: 4 %
HEMATOCRIT: 33.9 % — AB (ref 35.0–47.0)
HEMOGLOBIN: 11.7 g/dL — AB (ref 12.0–16.0)
LYMPHS ABS: 4.7 10*3/uL — AB (ref 1.0–3.6)
Lymphocytes Relative: 51 %
MCH: 31.9 pg (ref 26.0–34.0)
MCHC: 34.3 g/dL (ref 32.0–36.0)
MCV: 92.7 fL (ref 80.0–100.0)
MONO ABS: 0.7 10*3/uL (ref 0.2–0.9)
MONOS PCT: 8 %
NEUTROS ABS: 3.3 10*3/uL (ref 1.4–6.5)
Neutrophils Relative %: 36 %
Platelets: 310 10*3/uL (ref 150–440)
RBC: 3.66 MIL/uL — ABNORMAL LOW (ref 3.80–5.20)
RDW: 13.5 % (ref 11.5–14.5)
WBC: 9.2 10*3/uL (ref 3.6–11.0)

## 2017-05-28 MED ORDER — ANASTROZOLE 1 MG PO TABS: 1.0000 mg | ORAL_TABLET | Freq: Every day | ORAL | 3 refills | Status: DC

## 2017-05-28 NOTE — Progress Notes (Signed)
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 Mild-Leucocytosis/lymphocytosis [? 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 Gamma-delta clonal T cell disorder]  # Post Polio neuropathy/wheel chair      Carcinoma of overlapping sites of right breast in female, estrogen receptor positive ([place])     INTERVAL HISTORY:  A very pleasant 71 year old female patient with above history of polio/on a wheelchair with stage II breast cancer currently on adjuvant Arimidex; History of mild leukocytosis is here for follow-up.  Patient interim was evaluated by PCP- she was asked to be followed up with [place] regarding her elevated white count.  Patient denies any unusual shortness of breath or cough. Denies any fevers or chills. Denies any lumps or bumps. Denies any infections. Denies any night sweats.   REVIEW OF SYSTEMS:  A complete 10 point review of system is done which is negative except mentioned above/history of present illness.   PAST MEDICAL HISTORY :  Past Medical History:  Diagnosis Date  . Arthritis    hands,knees,shoulders  . Breast cancer ([place]) 2013   with chemo  . Breast cancer, right breast ([place]) 09/2012   Right Breast, pT2,N0 (l+sn)  . Dysrhythmia    heart skipped beat per patient, checked by Dr Saralyn Pilar  . GERD (gastroesophageal reflux disease)   . Hyperlipidemia   . Leukocytosis   . Lymphocytosis    low-grade clonal T-cell disorder  . Malignant neoplasm of lower-inner quadrant of female breast [facility])    right, s/p chemotherapy  . 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 Osteopenia   . Osteoporosis   . Personal history of chemotherapy   . 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 PONV (postoperative nausea and vomiting) 1 time   after hysterectomy  . Reflux   . Scoliosis   . Seizures ([place]) x3   [facility] could find nothing wrong.Dec 2015  . Transfusion history     PAST SURGICAL HISTORY :   Past Surgical History:  Procedure Laterality Date  . ABDOMINAL HYSTERECTOMY  08/10/83   ovaries were not removed  . BACK SURGERY     x 2  . BREAST BIOPSY Right 2008, 2013   2008 negative, 2013 positive  . BREAST BIOPSY Left 04-24-14   Calcifications associated with stroma and sclerosing adenosis  . BREAST SURGERY Right 10/2012   Mastectomy with s/n bx, and radiation therapy  . CATARACT EXTRACTION W/PHACO Right 02/20/2015   Procedure: CATARACT EXTRACTION PHACO AND INTRAOCULAR LENS PLACEMENT (IOC);  Surgeon: Leandrew Koyanagi, MD;  Location: [place];  Service: Ophthalmology;  Laterality: Right;  . COLONOSCOPY  2011   Dr. Candace Cruise, 2 benign polyps removed  . COLONOSCOPY WITH PROPOFOL N/A 08/12/2015   Procedure: COLONOSCOPY WITH PROPOFOL;  Surgeon: Hulen Luster, MD;  Location: [facility] ENDOSCOPY;  Service: Gastroenterology;  Laterality: N/A;  . ESOPHAGOGASTRODUODENOSCOPY (EGD) WITH PROPOFOL N/A 08/12/2015   Procedure: ESOPHAGOGASTRODUODENOSCOPY (EGD) WITH PROPOFOL;  Surgeon: Hulen Luster, MD;  Location: [facility] ENDOSCOPY;  Service: Gastroenterology;  Laterality: N/A;  . FOOT SURGERY  Right 1988  . LEG SURGERY Bilateral 1957, 1958  . MASTECTOMY Right   . PORTACATH PLACEMENT  10/2012   recently removed  . [employer]   . TUBAL LIGATION      FAMILY HISTORY :   Family History  Problem Relation Age of Onset  . Breast cancer Sister 71  . Lung cancer Unknown     SOCIAL HISTORY:   Social History  Substance Use Topics  . Smoking status: Former Smoker     Packs/day: 1.00    Years: 35.00    Types: Cigarettes    Quit date: 10/20/2003  . Smokeless tobacco: Never Used     Comment: Smoking History stopped smoking in 2005. smoked 1 pk/per day x 32 yrs  . Alcohol use No    ALLERGIES:  is allergic to augmentin [amoxicillin-pot clavulanate] and macrobid [nitrofurantoin macrocrystal].  MEDICATIONS:  Current Outpatient Prescriptions  Medication Sig Dispense Refill  . Calcium Carbonate (CALCIUM 600 PO) Take 1 tablet by mouth daily. Dinner    . cetirizine (ZYRTEC) 10 MG tablet Take 10 mg by mouth daily. AM    . Cholecalciferol (VITAMIN D3) 2000 UNITS TABS Take 1 tablet by mouth daily. Dinner    . famotidine (PEPCID) 20 MG tablet Take 20 mg by mouth daily. Am    . gabapentin (NEURONTIN) 300 MG capsule Take 1 capsule (300 mg total) by mouth 3 (three) times daily. 90 capsule 3  . Multiple Vitamin (MULTIVITAMIN) tablet Take 1 tablet by mouth daily. Dinner    . nicotine polacrilex (NICORETTE) 2 MG gum Take 2 mg by mouth as needed for smoking cessation.    Marland Kitchen anastrozole (ARIMIDEX) 1 MG tablet Take 1 tablet (1 mg total) by mouth daily. Dinner time 90 tablet 3  . ibuprofen (ADVIL,MOTRIN) 200 MG tablet Take 200 mg by mouth every 6 (six) hours as needed.     No current facility-administered medications for this visit.     PHYSICAL EXAMINATION: ECOG PERFORMANCE STATUS: 3 - Symptomatic, >50% confined to bed  BP 125/78 (BP Location: Left Arm, Patient Position: Sitting)   Pulse 94   Temp 97.6 F (36.4 C) (Tympanic)   Resp 20   Ht 5' (1.524 m) Comment: stated  Filed Weights    GENERAL: Well-nourished well-developed; Alert, no distress and comfortable.  She is in a wheelchair because of her polio. EYES: no pallor or icterus OROPHARYNX: no thrush or ulceration; good dentition  NECK: supple, no masses felt LYMPH:  no palpable lymphadenopathy in the cervical, axillary or inguinal regions LUNGS: clear to auscultation and  No wheeze or crackles HEART/CVS:  regular rate & rhythm and no murmurs; No lower extremity edema ABDOMEN:abdomen soft, non-tender and normal bowel sounds Musculoskeletal:no cyanosis of digits and no clubbing  PSYCH: alert & oriented x 3 with fluent speech NEURO: no focal motor/sensory deficits SKIN:  no rashes or significant lesions;  Right and left BREAST exam [in the presence of nurse]-right mastectomy noted; no unusual skin changes or dominant masses felt. Surgical scars noted. Left breast-no lumps or bumps spelled.    LABORATORY DATA:  I have reviewed the data as listed    Component Value Date/Time   NA 141 05/28/2017 1437   NA 140 05/15/2013 1038   K 3.5 05/28/2017 1437   K 3.6 05/15/2013 1038   CL 104 05/28/2017 1437   CL 106 05/15/2013 1038   CO2 31 05/28/2017 1437   CO2 29 05/15/2013 1038   GLUCOSE 89 05/28/2017 1437  GLUCOSE 95 05/15/2013 1038   BUN 9 05/28/2017 1437   BUN 7 05/15/2013 1038   CREATININE 0.72 05/28/2017 1437   CREATININE 0.63 05/28/2014 1028   CALCIUM 8.8 (L) 05/28/2017 1437   CALCIUM 9.2 05/15/2013 1038   PROT 6.9 05/28/2017 1437   PROT 6.8 05/28/2014 1028   ALBUMIN 3.3 (L) 05/28/2017 1437   ALBUMIN 3.1 (L) 05/28/2014 1028   AST 22 05/28/2017 1437   AST 22 05/28/2014 1028   ALT 14 05/28/2017 1437   ALT 21 05/28/2014 1028   ALKPHOS 75 05/28/2017 1437   ALKPHOS 98 05/28/2014 1028   BILITOT 0.5 05/28/2017 1437   BILITOT 0.4 05/28/2014 1028   GFRNONAA >60 05/28/2017 1437   GFRNONAA >60 05/28/2014 1028   GFRAA >60 05/28/2017 1437   GFRAA >60 05/28/2014 1028    No results found for: SPEP, UPEP  Lab Results  Component Value Date   WBC 9.2 05/28/2017   NEUTROABS 3.3 05/28/2017   HGB 11.7 (L) 05/28/2017   HCT 33.9 (L) 05/28/2017   MCV 92.7 05/28/2017   PLT 310 05/28/2017      Chemistry      Component Value Date/Time   NA 141 05/28/2017 1437   NA 140 05/15/2013 1038   K 3.5 05/28/2017 1437   K 3.6 05/15/2013 1038   CL 104 05/28/2017 1437   CL 106 05/15/2013 1038    CO2 31 05/28/2017 1437   CO2 29 05/15/2013 1038   BUN 9 05/28/2017 1437   BUN 7 05/15/2013 1038   CREATININE 0.72 05/28/2017 1437   CREATININE 0.63 05/28/2014 1028      Component Value Date/Time   CALCIUM 8.8 (L) 05/28/2017 1437   CALCIUM 9.2 05/15/2013 1038   ALKPHOS 75 05/28/2017 1437   ALKPHOS 98 05/28/2014 1028   AST 22 05/28/2017 1437   AST 22 05/28/2014 1028   ALT 14 05/28/2017 1437   ALT 21 05/28/2014 1028   BILITOT 0.5 05/28/2017 1437   BILITOT 0.4 05/28/2014 1028       RADIOGRAPHIC STUDIES: I have personally reviewed the radiological images as listed and agreed with the findings in the report. No results found.   ASSESSMENT & PLAN:   Carcinoma of overlapping sites of right breast in female, estrogen receptor positive ([place]) # STAGE II  RIGHT BREAST CA/Lobular cancer- on adjuvant Arimidex since July 2014. Clinically, no evidence of recurrence noted. Continue Arimidex patient tolerating Arimidex without any major side effects. Mammogram June 2018 normal.  # Mild chronic [since 2012] Lymphocytosis- 4500- chronic unchanged; mild anemia hemoglobin of 11.7.. I reviewed the labs. Question clonal Versus reactive. Await flow from today. If abnormal would recommend a bone marrow biopsy.  # Osteopenia/ BMD Dec 2016- Patient on calcium and vitamin D. Awaiting eval with endocrinologist-? BMD.    # follow up in 6 months/labs; or sooner based upon the above lab work results.  Cc; Dr.Aldridge.        Cammie Sickle, MD 05/30/2017 7:14 PM

## 2017-05-28 NOTE — Assessment & Plan Note (Addendum)
#  STAGE II  RIGHT BREAST CA/Lobular cancer- on adjuvant Arimidex since July 2014. Clinically, no evidence of recurrence noted. Continue Arimidex patient tolerating Arimidex without any major side effects. Mammogram June 2018 normal.  # Mild chronic [since 2012] Lymphocytosis- 4500- chronic unchanged; mild anemia hemoglobin of 11.7.. I reviewed the labs. Question clonal Versus reactive. Await flow from today. If abnormal would recommend a bone marrow biopsy.  # Osteopenia/ BMD Dec 2016- Patient on calcium and vitamin D. Awaiting eval with endocrinologist-? BMD.    # follow up in 6 months/labs; or sooner based upon the above lab work results.  Cc; Dr.Aldridge.

## 2017-05-28 NOTE — Progress Notes (Signed)
History of breast cancer. Ref back by Dr. Astrid Divine for abn cbc. Patient denies any fevers, chills or new onset of overall bone pain. She does have arthritis in her knees. Unable to stand for 2 to 3 mins at a time. She will have a left knee brace.

## 2017-06-01 LAB — COMP PANEL: LEUKEMIA/LYMPHOMA

## 2017-08-10 ENCOUNTER — Ambulatory Visit: Payer: Medicare HMO | Attending: Orthopedic Surgery

## 2017-08-10 VITALS — BP 143/78 | HR 79

## 2017-08-10 DIAGNOSIS — R262 Difficulty in walking, not elsewhere classified: Secondary | ICD-10-CM | POA: Insufficient documentation

## 2017-08-10 DIAGNOSIS — M25562 Pain in left knee: Secondary | ICD-10-CM | POA: Insufficient documentation

## 2017-08-10 DIAGNOSIS — M6281 Muscle weakness (generalized): Secondary | ICD-10-CM | POA: Diagnosis present

## 2017-08-10 DIAGNOSIS — G8929 Other chronic pain: Secondary | ICD-10-CM | POA: Insufficient documentation

## 2017-08-10 NOTE — Therapy (Signed)
[place] MAIN [facility] SERVICES [address] [place], [place], [address] Phone: [phone]   Fax:  [phone]  Physical Therapy Evaluation  Patient Details  Name: Erika Miller MRN: [id number] Date of Birth: 1945-12-25 Referring Provider: Reche Dixon PA-C  Encounter Date: 08/10/2017      PT End of Session - 08/12/17 1707    Visit Number 1   Number of Visits 16   Date for PT Re-Evaluation 2017/10/19   Authorization Type g codes 1/10   PT Start Time 1600   PT Stop Time 1700   PT Time Calculation (min) 60 min   Equipment Utilized During Treatment Gait belt   Activity Tolerance Patient tolerated treatment well   Behavior During Therapy [facility] for tasks assessed/performed      Past Medical History:  Diagnosis Date  . Arthritis    hands,knees,shoulders  . Breast cancer ([place]) 2013   with chemo  . Breast cancer, right breast ([place]) 09/2012   Right Breast, pT2,N0 (l+sn)  . Dysrhythmia    heart skipped beat per patient, checked by Dr Saralyn Pilar  . GERD (gastroesophageal reflux disease)   . Hyperlipidemia   . Leukocytosis   . Lymphocytosis    low-grade clonal T-cell disorder  . Malignant neoplasm of lower-inner quadrant of female breast [facility])    right, s/p chemotherapy  . 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 Osteopenia   . Osteoporosis   . Personal history of chemotherapy   . 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 PONV (postoperative nausea and vomiting) 1 time   after hysterectomy  . Reflux   . Scoliosis   . Seizures ([place]) x3   [facility] could find nothing wrong.Dec 2015  . Transfusion history     Past Surgical History:  Procedure Laterality Date  . ABDOMINAL HYSTERECTOMY  08/10/83   ovaries were not removed  . BACK SURGERY     x 2  . BREAST BIOPSY Right 2008, 2013   2008 negative, 2013 positive  . BREAST BIOPSY Left 04-24-14   Calcifications  associated with stroma and sclerosing adenosis  . BREAST SURGERY Right 10/2012   Mastectomy with s/n bx, and radiation therapy  . CATARACT EXTRACTION W/PHACO Right 02/20/2015   Procedure: CATARACT EXTRACTION PHACO AND INTRAOCULAR LENS PLACEMENT (IOC);  Surgeon: Leandrew Koyanagi, MD;  Location: [place];  Service: Ophthalmology;  Laterality: Right;  . COLONOSCOPY  2011   Dr. Candace Cruise, 2 benign polyps removed  . COLONOSCOPY WITH PROPOFOL N/A 08/12/2015   Procedure: COLONOSCOPY WITH PROPOFOL;  Surgeon: Hulen Luster, MD;  Location: [facility] ENDOSCOPY;  Service: Gastroenterology;  Laterality: N/A;  . ESOPHAGOGASTRODUODENOSCOPY (EGD) WITH PROPOFOL N/A 08/12/2015   Procedure: ESOPHAGOGASTRODUODENOSCOPY (EGD) WITH PROPOFOL;  Surgeon: Hulen Luster, MD;  Location: [facility] ENDOSCOPY;  Service: Gastroenterology;  Laterality: N/A;  . FOOT SURGERY Right 1988  . LEG SURGERY Bilateral 1957, 1958  . MASTECTOMY Right   . PORTACATH PLACEMENT  10/2012   recently removed  . [place]   . TUBAL LIGATION      Vitals:   08/10/17 1610  BP: (!) 143/78  Pulse: 79  SpO2: 100%         Subjective Assessment - 08/12/17 1700    Subjective L knee pain and gait instability   Pertinent History Pt referred for gait training and L knee OA. Patient denies any recent injury  or trauma affecting left knee, no history of left knee fracture. Pt has a history of polio when she was 71 years old. She has post-polio syndrome and has been utilizing a wheelchair as primary means of mobility for the last 10-12 years. She is able to perform transfers independently. Even after recovery she states that she has always had difficulty walking but never required the use of an assistive device. When she is standing she hyperextends left knee to lean up against her wheelchair to stabilize her left knee. She feels that the left knee is "bending backwards" more than the right knee. She denies any history of bracing for lower extremities,  she does report that she was at one point in bilateral long-leg cast when she was younger. She has a history of 2 back surgeries in the last 1960's (lumbar fusion). She has been having increased L knee pain for the last 2 years and has had a KAFO for the last 2-3 weeks for added stability. History of breast cancer s/p chemotherapy and surgery. She follows up with oncology regularly. Denies numbness/tingling in bilateral LEs.   Limitations Walking   How long can you stand comfortably? 5 minutes   Patient Stated Goals "I want to be able to stand at my kitchen counter and roll out 8 pie crusts for my Thanksgiving meal."   Currently in Pain? No/denies   Pain Score 0-No pain  Worst: 9/10, Best: 0/10   Pain Location Knee   Pain Orientation Left   Pain Descriptors / Indicators Pressure  Difficulty fully describing   Pain Type Chronic pain   Pain Radiating Towards None   Pain Onset More than a month ago   Pain Frequency Intermittent   Aggravating Factors  Standing, transfers, walking   Pain Relieving Factors Resting, KAFO   Multiple Pain Sites No            [facility] PT Assessment - 08/12/17 1705      Assessment   Medical Diagnosis L knee OA, post-polio syndrome   Referring Provider Reche Dixon PA-C   Onset Date/Surgical Date 08/11/07  Approximate, worsening of weakness over the last 10-12 years   Hand Dominance Right   Next MD Visit None scheduled   Prior Therapy No history of physical therapy     Precautions   Precautions Fall     Restrictions   Weight Bearing Restrictions No     Balance Screen   Has the patient fallen in the past 6 months Yes   How many times? At least 2   Has the patient had a decrease in activity level because of a fear of falling?  Yes   Is the patient reluctant to leave their home because of a fear of falling?  No     Home Ecologist residence   Living Arrangements Spouse/significant other   Available Help at Discharge Family    Type of [place] One level   Home Equipment Wheelchair - power;Walker - standard;Cane - single point;Bedside commode;Shower seat;Tub bench;Grab bars - tub/shower     Prior Function   Level of Independence Independent with basic ADLs;Other (comment)  Husband and dtr assist with IADLs   Vocation Retired   Leisure Go out to eat, spend time with friends     Cognition   Overall Cognitive Status Within Functional Limits for tasks assessed     Observation/Other Assessments   Other Surveys  Other Surveys   Lower Extremity Functional Scale  20/80     Sensation   Additional Comments denies N/T in bilateral LE     Posture/Postural Control   Posture Comments Severe L knee valgus and hyperextension in standing.     ROM / Strength   AROM / PROM / Strength AROM;Strength     AROM   Overall AROM Comments Hypermobile L patella superiorly, medially, and laterally. Significant L knee medial, lateral, and anterior laxity.      Strength   Overall Strength Comments Hip flexion, abduction, and adduction 2/5 bilateral, hip IR: 4-/5, ER: 3+/5 bilateral; Knee extension: 3+/5 bilateral, Knee flexion: 4-/5 bilateral, Ankle DF: L 3+/5, R 4-/5;     Palpation   Palpation comment Mild pain to medial and lateral joint line     Transfers   Comments Heavy UE reliance for transfers with significant R knee valgus and hyperextension     Ambulation/Gait   Gait Comments Pt able to ambulate in parallel bars down an back (approximately 53' with heavy UE support and KAFO on LLE. Decreased weight acceptance ot LLE noted with decreased R step length;           Objective measurements completed on examination: See above findings.           PT Education - 08/12/17 1706    Education provided Yes   Education Details HEP and plan of care   Person(s) Educated Patient   Methods Explanation;Demonstration;Verbal cues;Handout   Comprehension Verbalized  understanding;Returned demonstration         PT Short Term Goals - 08/12/17 1713      PT SHORT TERM GOAL #1   Title Pt will be independent with HEP in order to improve strength and balance in order to decrease fall risk and improve function at home and work.    Time 4   Period Weeks   Status New   Target Date 09/07/17      Pt only able to balance for approximately 3s without UE support before falling backwards.       PT Long Term Goals - 08/12/17 1714      PT LONG TERM GOAL #1   Title Pt will increase LEFS by at least 9 points in order to demonstrate significant improvement in lower extremity function.   Baseline 08/10/17: 20/80   Time 8   Period Weeks   Status New   Target Date 10/05/17     PT LONG TERM GOAL #2   Title Pt will improve hip strength by 1/2 MMT in order to improve standing tolerance and safety with functional mobility   Baseline 08/10/17: Hip flexion, abduction, and adduction 2/5 bilateral, hip IR: 4-/5, ER: 3+/5 bilateral;   Time 8   Period Weeks   Status New   Target Date 10/05/17     PT LONG TERM GOAL #3   Title Pt will be able to stand for at least 20 minutes at a time in order to roll out 8 pie crusts for Thanksgiving Meal with her family   Baseline 08/10/17: can stand for 5 minutes at at ime   Time 8   Period Weeks   Status New   Target Date 10/05/17     PT LONG TERM GOAL #4   Title Pt will be able to balance without UE support for at least 15 seconds in order to reduce fall risk and improve safety with transfers    Baseline 08/10/17: 3s   Time  8   Period Weeks   Status New   Target Date 10/05/17                Plan - 08/12/17 1707    Clinical Impression Statement Pt is a pleasant 71 yo female referred for gait training and L knee OA. Patient denies any recent injury or trauma affecting left knee, no history of left knee fracture. Pt has a history of polio when she was 71 years old. She has post-polio syndrome and has been utilizing  a wheelchair as primary means of mobility for the last 10-12 years. She is able to perform transfers independently but does not ambulate. She has been having increased L knee pain for the last 2 years and was issued a KAFO 2-3 weeks ago for added stability. Pt demonstrates significantly weak hip flexion, abduction, adduction, and IR/ER bilaterally. She can currently only tolerate standing for approximately 5 minutes at a time. Pt able to ambulate in parallel bars down an back (approximately 14') with heavy UE support and KAFO on LLE. Decreased weight acceptance ot LLE noted with decreased R step length. Her LEFS is 20/80 indicating significant disability. Pt states that her goal is "I want to be able to stand at my kitchen counter and roll out 8 pie crusts for my Thanksgiving meal with my family." Pt will benefit from PT services to address deficits in strength, balance, and mobility in order to improve her functional mobility, safety, and tolerance for extended standing.    History and Personal Factors relevant to plan of care:  2 personal factors/comorbidities, 3 body systems/activity limitations/participation restrictions     Clinical Presentation Evolving   Clinical Presentation due to: post-polio with progressive weakness, worsening L knee pain   Clinical Decision Making Moderate   Rehab Potential Fair   PT Frequency 2x / week   PT Duration 8 weeks   PT Treatment/Interventions Aquatic Therapy;ADLs/Self Care Home Management;Cryotherapy;Electrical Stimulation;Iontophoresis 4mg /ml Dexamethasone;Moist Heat;Ultrasound;Traction;DME Instruction;Gait training;Stair training;Functional mobility training;Therapeutic activities;Therapeutic exercise;Balance training;Neuromuscular re-education;Patient/family education;Manual techniques;Passive range of motion   PT Next Visit Plan progress standing endurance, balance,    PT Home Exercise Plan see patient instructions   Consulted and Agree with Plan of Care Patient       Patient will benefit from skilled therapeutic intervention in order to improve the following deficits and impairments:  Decreased balance, Decreased mobility, Decreased strength, Difficulty walking, Pain  Visit Diagnosis: Muscle weakness (generalized) - Plan: PT plan of care cert/re-cert  Chronic pain of left knee - Plan: PT plan of care cert/re-cert  Difficulty in walking, not elsewhere classified - Plan: PT plan of care cert/re-cert      G-Codes - 24/09/73 1717    Functional Assessment Tool Used (Outpatient Only) LEFS, MMT, clinical judgement, standing balance without UE support   Functional Limitation Mobility: Walking and moving around   Mobility: Walking and Moving Around Current Status ([id number]) At least 80 percent but less than 100 percent impaired, limited or restricted   Mobility: Walking and Moving Around Goal Status [phone]) At least 60 percent but less than 80 percent impaired, limited or restricted       Problem List Patient Active Problem List   Diagnosis Date Noted  . Lymphocytosis 05/28/2017  . Carcinoma of overlapping sites of right breast in female, estrogen receptor positive ([place]) 09/07/2016  . Microcalcifications of the breast 04/11/2014  . History of breast cancer 09/26/2013   Phillips Grout PT, DPT   Rayshun Kandler 08/12/2017, 5:19 PM  [facility]  [place] MAIN [facility] SERVICES [address] [place], [place], [address] Phone: [phone]   Fax:  [phone]  Name: Erika Miller MRN: [id number] Date of Birth: 07-25-1946

## 2017-08-16 ENCOUNTER — Ambulatory Visit: Payer: Medicare HMO | Admitting: Physical Therapy

## 2017-08-17 ENCOUNTER — Ambulatory Visit: Payer: Medicare HMO | Admitting: Physical Therapy

## 2017-08-19 ENCOUNTER — Ambulatory Visit: Payer: Medicare HMO

## 2017-08-24 ENCOUNTER — Ambulatory Visit: Payer: Medicare HMO | Admitting: Physical Therapy

## 2017-08-30 ENCOUNTER — Encounter: Payer: Self-pay | Admitting: Physical Therapy

## 2017-08-30 ENCOUNTER — Ambulatory Visit: Payer: Medicare HMO | Attending: Orthopedic Surgery | Admitting: Physical Therapy

## 2017-08-30 DIAGNOSIS — M25562 Pain in left knee: Secondary | ICD-10-CM | POA: Diagnosis present

## 2017-08-30 DIAGNOSIS — G8929 Other chronic pain: Secondary | ICD-10-CM | POA: Diagnosis present

## 2017-08-30 DIAGNOSIS — R262 Difficulty in walking, not elsewhere classified: Secondary | ICD-10-CM | POA: Diagnosis present

## 2017-08-30 DIAGNOSIS — M6281 Muscle weakness (generalized): Secondary | ICD-10-CM | POA: Diagnosis present

## 2017-08-30 NOTE — Therapy (Signed)
[place] MAIN [facility] SERVICES [address] [place], [place], [address] Phone: [phone]   Fax:  [phone]  Physical Therapy Treatment  Patient Details  Name: Erika Miller MRN: [id number] Date of Birth: 1946/02/23 Referring Provider: Reche Dixon PA-C   Encounter Date: 08/30/2017  PT End of Session - 08/30/17 1024    Visit Number  2    Number of Visits  16    Date for PT Re-Evaluation  10/05/17    Authorization Type  g codes 2022/12/14    PT Start Time  5400    PT Stop Time  1100    PT Time Calculation (min)  45 min    Equipment Utilized During Treatment  Gait belt    Activity Tolerance  Patient tolerated treatment well;No increased pain    Behavior During Therapy  WFL for tasks assessed/performed       Past Medical History:  Diagnosis Date  . Arthritis    hands,knees,shoulders  . Breast cancer ([place]) 2013   with chemo  . Breast cancer, right breast ([place]) 09/2012   Right Breast, pT2,N0 (l+sn)  . Dysrhythmia    heart skipped beat per patient, checked by Dr Saralyn Pilar  . GERD (gastroesophageal reflux disease)   . Hyperlipidemia   . Leukocytosis   . Lymphocytosis    low-grade clonal T-cell disorder  . Malignant neoplasm of lower-inner quadrant of female breast [facility])    right, s/p chemotherapy  . 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 Osteopenia   . Osteoporosis   . Personal history of chemotherapy   . 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 PONV (postoperative nausea and vomiting) 1 time   after hysterectomy  . Reflux   . Scoliosis   . Seizures ([place]) x3   [facility] could find nothing wrong.Dec 2015  . Transfusion history     Past Surgical History:  Procedure Laterality Date  . ABDOMINAL HYSTERECTOMY  08/10/83   ovaries were not removed  . BACK SURGERY     x 2  . BREAST BIOPSY Right 2008, 2013   2008 negative, 2013 positive  . BREAST BIOPSY  Left 04-24-14   Calcifications associated with stroma and sclerosing adenosis  . BREAST SURGERY Right 10/2012   Mastectomy with s/n bx, and radiation therapy  . COLONOSCOPY  2011   Dr. Candace Cruise, 2 benign polyps removed  . FOOT SURGERY Right 1988  . LEG SURGERY Bilateral 1957, 1958  . MASTECTOMY Right   . PORTACATH PLACEMENT  10/2012   recently removed  . [place]   . TUBAL LIGATION      There were no vitals filed for this visit.  Subjective Assessment - 08/30/17 1022    Subjective  Patient reports slight pain in left thigh; Patient was given exercises for HEP but states that she got sick after last session and has been unable to do them; She reports, "I feel better now."     Pertinent History  Pt referred for gait training and L knee OA. Patient denies any recent injury or trauma affecting left knee, no history of left knee fracture. Pt has a history of polio when she was 71 years old. She has post-polio syndrome and has been utilizing a wheelchair as primary means of mobility for the last 10-12 years. She is able to perform transfers independently. Even after recovery she states that  she has always had difficulty walking but never required the use of an assistive device. When she is standing she hyperextends left knee to lean up against her wheelchair to stabilize her left knee. She feels that the left knee is "bending backwards" more than the right knee. She denies any history of bracing for lower extremities, she does report that she was at one point in bilateral long-leg cast when she was younger. She has a history of 2 back surgeries in the last 1960's (lumbar fusion). She has been having increased L knee pain for the last 2 years and has had a KAFO for the last 2-3 weeks for added stability. History of breast cancer s/p chemotherapy and surgery. She follows up with oncology regularly. Denies numbness/tingling in bilateral LEs.    Limitations  Walking    How long can you stand  comfortably?  5 minutes    Patient Stated Goals  "I want to be able to stand at my kitchen counter and roll out 8 pie crusts for my Thanksgiving meal."    Currently in Pain?  Yes    Pain Score  2     Pain Location  Leg    Pain Orientation  Left    Pain Descriptors / Indicators  Aching;Sore    Pain Type  Chronic pain    Pain Onset  More than a month ago    Pain Frequency  Intermittent    Aggravating Factors   worse with standing/transfers/walking;     Pain Relieving Factors  resting/KAFO    Multiple Pain Sites  No       TREATMENT: Warm up on Nustep level 2 x5 min (Unbilled);  Patient presents to therapy with scooter; She brought her KAFO and states that she only wears it when she needs to stand with kitchen tasks; She hasn't wore her brace since 08/10/17;  Patient transfers wheelchair<>mat table/nustep with supervision with 2 HHA with stand pivot transfer;  Instructed patient in LE strengthening; Hooklying: LE SAQ 2# x10 with cues to move slowly for better quad control; LE quad sets 10 sec hold x5 reps with cues to increase hold time for better quad control; SLR flexion with AAROM x10 bilaterally; Patient exhibits decreased knee control requiring assist for knee stability;   Hooklying with yellow tband around BLE: Hip flexion march AAROM 2x10 with cues to increase core stabilization for better hip flexor activation; Hip abduction/ER 2x10 with cues to slow down LE movement for better strengthening;    Sidelying hip abduction clamshells yellow tband x12 bilaterally with cues to avoid posterior trunk lean for better hip strengthening;   Patient heavily fatigued at end of exercise; She denies any increase in pain;                   PT Education - 08/30/17 1023    Education provided  Yes    Education Details  strengthening, HEP,     Person(s) Educated  Patient    Methods  Explanation;Demonstration;Verbal cues    Comprehension  Verbalized understanding;Returned  demonstration;Verbal cues required;Need further instruction       PT Short Term Goals - 08/12/17 1713      PT SHORT TERM GOAL #1   Title  Pt will be independent with HEP in order to improve strength and balance in order to decrease fall risk and improve function at home and work.     Time  4    Period  Weeks    Status  New    Target Date  09/07/17        PT Long Term Goals - 08/12/17 1714      PT LONG TERM GOAL #1   Title  Pt will increase LEFS by at least 9 points in order to demonstrate significant improvement in lower extremity function.    Baseline  08/10/17: 20/80    Time  8    Period  Weeks    Status  New    Target Date  10/05/17      PT LONG TERM GOAL #2   Title  Pt will improve hip strength by 1/2 MMT in order to improve standing tolerance and safety with functional mobility    Baseline  08/10/17: Hip flexion, abduction, and adduction 2/5 bilateral, hip IR: 4-/5, ER: 3+/5 bilateral;    Time  8    Period  Weeks    Status  New    Target Date  10/05/17      PT LONG TERM GOAL #3   Title  Pt will be able to stand for at least 20 minutes at a time in order to roll out 8 pie crusts for Thanksgiving Meal with her family    Baseline  08/10/17: can stand for 5 minutes at at ime    Time  8    Period  Weeks    Status  New    Target Date  10/05/17      PT LONG TERM GOAL #4   Title  Pt will be able to balance without UE support for at least 15 seconds in order to reduce fall risk and improve safety with transfers     Baseline  08/10/17: 3s    Time  8    Period  Weeks    Status  New    Target Date  10/05/17            Plan - 08/30/17 1050    Clinical Impression Statement  PT instructed patient in advanced LE strengthening exercise for hip and knee strengthening; Patient requires cues for correct positioning for better strength; She also required AAROM with some exercise due to increased weakness. Patient reports increased fatigue at end of treatment session; She  denies any increase in pain; She would benefit from additional skilled PT Intervention to improve strength, balance and gait safety;     Rehab Potential  Fair    PT Frequency  2x / week    PT Duration  8 weeks    PT Treatment/Interventions  Aquatic Therapy;ADLs/Self Care Home Management;Cryotherapy;Electrical Stimulation;Iontophoresis 4mg /ml Dexamethasone;Moist Heat;Ultrasound;Traction;DME Instruction;Gait training;Stair training;Functional mobility training;Therapeutic activities;Therapeutic exercise;Balance training;Neuromuscular re-education;Patient/family education;Manual techniques;Passive range of motion    PT Next Visit Plan  progress standing endurance, balance,     PT Home Exercise Plan  see patient instructions    Consulted and Agree with Plan of Care  Patient       Patient will benefit from skilled therapeutic intervention in order to improve the following deficits and impairments:  Decreased balance, Decreased mobility, Decreased strength, Difficulty walking, Pain  Visit Diagnosis: Muscle weakness (generalized)  Chronic pain of left knee  Difficulty in walking, not elsewhere classified     Problem List Patient Active Problem List   Diagnosis Date Noted  . Lymphocytosis 05/28/2017  . Carcinoma of overlapping sites of right breast in female, estrogen receptor positive ([place]) 09/07/2016  . Microcalcifications of the breast 04/11/2014  . History of breast cancer 09/26/2013     Jacqueline Delapena PT, DPT  08/30/2017, 11:00 AM  [place] MAIN [facility] SERVICES [address] [place], [place], [address] Phone: [phone]   Fax:  [phone]  Name: SALLIE STARON MRN: [id number] Date of Birth: Feb 10, 1946

## 2017-08-30 NOTE — Patient Instructions (Addendum)
Quad Set    With other leg bent, foot flat, slowly tighten muscles on thigh of straight leg while counting out loud to _10 sec hold___. Repeat with other leg. Repeat _5-10___ times. Do __2__ sessions per day.  http://gt2.exer.us/276   Copyright  VHI. All rights reserved.  Short [employer] a large can or rolled towel under left leg. Straighten leg. Hold ___5_ seconds. Repeat __10__ times. Do ___2_ sessions per day.  http://gt2.exer.us/299   Copyright  VHI. All rights reserved.

## 2017-09-01 ENCOUNTER — Encounter: Payer: Self-pay | Admitting: Physical Therapy

## 2017-09-01 ENCOUNTER — Ambulatory Visit: Payer: Medicare HMO | Admitting: Physical Therapy

## 2017-09-01 DIAGNOSIS — M25562 Pain in left knee: Secondary | ICD-10-CM

## 2017-09-01 DIAGNOSIS — M6281 Muscle weakness (generalized): Secondary | ICD-10-CM | POA: Diagnosis not present

## 2017-09-01 DIAGNOSIS — R262 Difficulty in walking, not elsewhere classified: Secondary | ICD-10-CM

## 2017-09-01 DIAGNOSIS — G8929 Other chronic pain: Secondary | ICD-10-CM

## 2017-09-01 NOTE — Therapy (Signed)
[place] MAIN [facility] SERVICES [address] [place], [place], [address] Phone: [phone]   Fax:  [phone]  Physical Therapy Treatment/Discharge Summary  Patient Details  Name: Erika Miller MRN: [id number] Date of Birth: 07-Dec-1945 Referring Provider: Reche Dixon PA-C   Encounter Date: 09/01/2017  PT End of Session - 09/01/17 1501    Visit Number  3    Number of Visits  16    Date for PT Re-Evaluation  10/05/17    Authorization Type  g codes 01-17-23    PT Start Time  1430    PT Stop Time  1515    PT Time Calculation (min)  45 min    Equipment Utilized During Treatment  Gait belt    Activity Tolerance  Patient tolerated treatment well;No increased pain    Behavior During Therapy  WFL for tasks assessed/performed       Past Medical History:  Diagnosis Date  . Arthritis    hands,knees,shoulders  . Breast cancer ([place]) 2013   with chemo  . Breast cancer, right breast ([place]) 09/2012   Right Breast, pT2,N0 (l+sn)  . Dysrhythmia    heart skipped beat per patient, checked by Dr Saralyn Pilar  . GERD (gastroesophageal reflux disease)   . Hyperlipidemia   . Leukocytosis   . Lymphocytosis    low-grade clonal T-cell disorder  . Malignant neoplasm of lower-inner quadrant of female breast [facility])    right, s/p chemotherapy  . 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 Osteopenia   . Osteoporosis   . Personal history of chemotherapy   . 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 PONV (postoperative nausea and vomiting) 1 time   after hysterectomy  . Reflux   . Scoliosis   . Seizures ([place]) x3   [facility] could find nothing wrong.Dec 2015  . Transfusion history     Past Surgical History:  Procedure Laterality Date  . ABDOMINAL HYSTERECTOMY  08/10/83   ovaries were not removed  . BACK SURGERY     x 2  . BREAST BIOPSY Right 2008, 2013   2008 negative, 2013 positive   . BREAST BIOPSY Left 04-24-14   Calcifications associated with stroma and sclerosing adenosis  . BREAST SURGERY Right 10/2012   Mastectomy with s/n bx, and radiation therapy  . COLONOSCOPY  2011   Dr. Candace Cruise, 2 benign polyps removed  . FOOT SURGERY Right 1988  . LEG SURGERY Bilateral 1957, 1958  . MASTECTOMY Right   . PORTACATH PLACEMENT  10/2012   recently removed  . [place]   . TUBAL LIGATION      There were no vitals filed for this visit.  Subjective Assessment - 09/01/17 1500    Subjective  patient reports not having a good day; She reports increased dizziness today; She reports that she will ocassionally get these dizzy spells; She reports that she is following up with Dr. Manuella Ghazi regarding dizziness- states that its not her blood pressure or sugar levels;     Pertinent History  Pt referred for gait training and L knee OA. Patient denies any recent injury or trauma affecting left knee, no history of left knee fracture. Pt has a history of polio when she was 71 years old. She has post-polio syndrome and has been utilizing a wheelchair as primary means of mobility for the last 10-12 years. She is able  to perform transfers independently. Even after recovery she states that she has always had difficulty walking but never required the use of an assistive device. When she is standing she hyperextends left knee to lean up against her wheelchair to stabilize her left knee. She feels that the left knee is "bending backwards" more than the right knee. She denies any history of bracing for lower extremities, she does report that she was at one point in bilateral long-leg cast when she was younger. She has a history of 2 back surgeries in the last 1960's (lumbar fusion). She has been having increased L knee pain for the last 2 years and has had a KAFO for the last 2-3 weeks for added stability. History of breast cancer s/p chemotherapy and surgery. She follows up with oncology regularly. Denies  numbness/tingling in bilateral LEs.    Limitations  Walking    How long can you stand comfortably?  5 minutes    Patient Stated Goals  "I want to be able to stand at my kitchen counter and roll out 8 pie crusts for my Thanksgiving meal."    Currently in Pain?  No/denies         TREATMENT: Patient transferred squat pivot wheelchair<> mat table, mod I with 2 HHA on mat table with good safety awareness and positioning;  She is mod I for don/doff KAFO on LLE exhibiting good safety awareness and positioning; Patient reports that she doesn't wear it all the time due to difficulty with toileting; Patient reports that she is able to do safe toileting without incontinence or accidents when wearing KAFO;  Patient able to stand with RW and LLE KAFO with mod I;exhibits heavy LLE valgus position with approximately 30 degrees curve with mild posterior sag in LLE knee; Patient reports that she has been non-ambulatory for last 15+ years; Based on knee position even with KAFO it is not recommended that patient try ambulation due to risk of further knee injury. She has a chronic condition that will not improve (post polio syndrome) that led to knee instability and weakness;   Patient is able to stand with KAFO for short periods of time with support. She reports fatigue as her biggest limitation with standing; Patient reports that she has not stood for >5 min  In over 5-10 years; Patient is mod I for all ADLs from power wheelchair; She is able to stand short times for cooking and light housework; Patient expressed that she is at her baseline and doesn't anticipate improving due to her chronic condition;  Since patient is mod I for KAFO wear and understands importance of wear time for better knee support in standing and is currently functioning at baseline she is appropriate for discharge from PT at this time; Patient agreeable;                      PT Education - 09/01/17 1501    Education  provided  Yes    Education Details  KAFO positioning, transfers;     Person(s) Educated  Patient    Methods  Explanation;Demonstration;Verbal cues    Comprehension  Verbalized understanding;Returned demonstration;Verbal cues required;Need further instruction       PT Short Term Goals - 09/01/17 1652      PT SHORT TERM GOAL #1   Title  Pt will be independent with HEP in order to improve strength and balance in order to decrease fall risk and improve function at home and work.  Time  4    Period  Weeks    Status  Achieved        PT Long Term Goals - 09/01/17 1652      PT LONG TERM GOAL #1   Title  Pt will increase LEFS by at least 9 points in order to demonstrate significant improvement in lower extremity function.    Baseline  08/10/17: 20/80    Time  8    Period  Weeks    Status  Not Met      PT LONG TERM GOAL #2   Title  Pt will improve hip strength by 1/2 MMT in order to improve standing tolerance and safety with functional mobility    Baseline  08/10/17: Hip flexion, abduction, and adduction 2/5 bilateral, hip IR: 4-/5, ER: 3+/5 bilateral;    Time  8    Period  Weeks    Status  Not Met      PT LONG TERM GOAL #3   Title  Pt will be able to stand for at least 20 minutes at a time in order to roll out 8 pie crusts for Thanksgiving Meal with her family    Baseline  08/10/17: can stand for 5 minutes at at ime    Time  8    Period  Weeks    Status  Not Met      PT LONG TERM GOAL #4   Title  Pt will be able to balance without UE support for at least 15 seconds in order to reduce fall risk and improve safety with transfers     Baseline  08/10/17: 3s    Time  8    Period  Weeks    Status  Not Met            Plan - 09/01/17 1624    Clinical Impression Statement  Patient is independent is don/doff KAFO on LLE in sitting; She verbalized good safety techniques with locking/unlocking brace; PT recommended that patient wear brace daily to assist with knee support in  standing; When standing, patient exhibits severe knee valgus on LLE with posterior lag despite KAFO support. Due to chronic weakness/instability related to post-polio syndrome that is not likely to change; PT does not recommend patient ambulate on LLE due to instability and poor knee positioning which could lead to increased knee pain; She is mod I with all mobility using power wheelchair and is mod I with all sit<>Stand/stand pivot transfers. She is appropriate for discharge from therapy at this time as she is at her baseline; recommend patient contact us if she has a change in functional status or increase in pain;     Rehab Potential  Fair    PT Frequency  2x / week    PT Duration  8 weeks    PT Treatment/Interventions  Aquatic Therapy;ADLs/Self Care Home Management;Cryotherapy;Electrical Stimulation;Iontophoresis 69m/ml Dexamethasone;Moist Heat;Ultrasound;Traction;DME Instruction;Gait training;Stair training;Functional mobility training;Therapeutic activities;Therapeutic exercise;Balance training;Neuromuscular re-education;Patient/family education;Manual techniques;Passive range of motion    PT Next Visit Plan  progress standing endurance, balance,     PT Home Exercise Plan  see patient instructions    Consulted and Agree with Plan of Care  Patient       Patient will benefit from skilled therapeutic intervention in order to improve the following deficits and impairments:  Decreased balance, Decreased mobility, Decreased strength, Difficulty walking, Pain  Visit Diagnosis: Muscle weakness (generalized)  Chronic pain of left knee  Difficulty in walking, not elsewhere classified  G-Codes - 09/01/17 1653    Functional Assessment Tool Used (Outpatient Only)  Clinical judgement, transfer ability, functional mobility;     Functional Limitation  Mobility: Walking and moving around    Mobility: Walking and Moving Around Goal Status [phone])  At least 60 percent but less than 80 percent impaired,  limited or restricted    Mobility: Walking and Moving Around Discharge Status [phone])  At least 20 percent but less than 40 percent impaired, limited or restricted       Problem List Patient Active Problem List   Diagnosis Date Noted  . Lymphocytosis 05/28/2017  . Carcinoma of overlapping sites of right breast in female, estrogen receptor positive ([place]) 09/07/2016  . Microcalcifications of the breast 04/11/2014  . History of breast cancer 09/26/2013    Trotter,Margaret PT, DPT 09/01/2017, 4:54 PM  [place] MAIN [facility] SERVICES [address] [place], [place], [address] Phone: [phone]   Fax:  [phone]  Name: Erika Miller MRN: [id number] Date of Birth: [birth year]

## 2017-09-06 ENCOUNTER — Ambulatory Visit: Payer: Medicare HMO | Admitting: Physical Therapy

## 2017-09-06 ENCOUNTER — Other Ambulatory Visit: Payer: PPO

## 2017-09-06 ENCOUNTER — Ambulatory Visit: Payer: PPO | Admitting: Internal Medicine

## 2017-09-13 ENCOUNTER — Encounter: Payer: Medicare HMO | Admitting: Physical Therapy

## 2017-09-15 ENCOUNTER — Encounter: Payer: Medicare HMO | Admitting: Physical Therapy

## 2017-09-20 ENCOUNTER — Encounter: Payer: Medicare HMO | Admitting: Physical Therapy

## 2017-09-22 ENCOUNTER — Encounter: Payer: Medicare HMO | Admitting: Physical Therapy

## 2017-12-01 ENCOUNTER — Other Ambulatory Visit: Payer: Self-pay | Admitting: Family Medicine

## 2017-12-01 DIAGNOSIS — M85852 Other specified disorders of bone density and structure, left thigh: Principal | ICD-10-CM

## 2017-12-01 DIAGNOSIS — M85851 Other specified disorders of bone density and structure, right thigh: Secondary | ICD-10-CM

## 2017-12-07 ENCOUNTER — Ambulatory Visit
Admission: RE | Admit: 2017-12-07 | Discharge: 2017-12-07 | Disposition: A | Discharge status: Home / Self Care | Payer: Medicare HMO | Source: Ambulatory Visit | Attending: Family Medicine | Admitting: Family Medicine

## 2017-12-07 DIAGNOSIS — M85851 Other specified disorders of bone density and structure, right thigh: Secondary | ICD-10-CM

## 2017-12-07 DIAGNOSIS — M85852 Other specified disorders of bone density and structure, left thigh: Secondary | ICD-10-CM

## 2017-12-07 DIAGNOSIS — M419 Scoliosis, unspecified: Secondary | ICD-10-CM | POA: Diagnosis not present

## 2017-12-07 DIAGNOSIS — M8589 Other specified disorders of bone density and structure, multiple sites: Secondary | ICD-10-CM | POA: Insufficient documentation

## 2017-12-08 ENCOUNTER — Other Ambulatory Visit: Payer: Self-pay | Admitting: *Deleted

## 2017-12-08 DIAGNOSIS — Z170 Estrogen receptor positive status [ER+]: Principal | ICD-10-CM

## 2017-12-08 DIAGNOSIS — C50811 Malignant neoplasm of overlapping sites of right female breast: Secondary | ICD-10-CM

## 2017-12-08 DIAGNOSIS — D72820 Lymphocytosis (symptomatic): Secondary | ICD-10-CM

## 2017-12-09 ENCOUNTER — Inpatient Hospital Stay: Payer: Medicare HMO | Admitting: Internal Medicine

## 2017-12-09 ENCOUNTER — Inpatient Hospital Stay (HOSPITAL_BASED_OUTPATIENT_CLINIC_OR_DEPARTMENT_OTHER): Payer: Medicare HMO | Admitting: Internal Medicine

## 2017-12-09 ENCOUNTER — Inpatient Hospital Stay: Payer: Medicare HMO | Attending: Internal Medicine

## 2017-12-09 ENCOUNTER — Inpatient Hospital Stay: Payer: Medicare HMO

## 2017-12-09 VITALS — BP 143/78 | HR 89 | Temp 97.9°F | Resp 16

## 2017-12-09 DIAGNOSIS — E785 Hyperlipidemia, unspecified: Secondary | ICD-10-CM | POA: Insufficient documentation

## 2017-12-09 DIAGNOSIS — Z170 Estrogen receptor positive status [ER+]: Secondary | ICD-10-CM

## 2017-12-09 DIAGNOSIS — D649 Anemia, unspecified: Secondary | ICD-10-CM

## 2017-12-09 DIAGNOSIS — C50811 Malignant neoplasm of overlapping sites of right female breast: Secondary | ICD-10-CM | POA: Diagnosis not present

## 2017-12-09 DIAGNOSIS — Z923 Personal history of irradiation: Secondary | ICD-10-CM | POA: Insufficient documentation

## 2017-12-09 DIAGNOSIS — Z79899 Other long term (current) drug therapy: Secondary | ICD-10-CM | POA: Insufficient documentation

## 2017-12-09 DIAGNOSIS — Z9011 Acquired absence of right breast and nipple: Secondary | ICD-10-CM

## 2017-12-09 DIAGNOSIS — K219 Gastro-esophageal reflux disease without esophagitis: Secondary | ICD-10-CM | POA: Diagnosis not present

## 2017-12-09 DIAGNOSIS — Z79811 Long term (current) use of aromatase inhibitors: Secondary | ICD-10-CM | POA: Diagnosis not present

## 2017-12-09 DIAGNOSIS — M8580 Other specified disorders of bone density and structure, unspecified site: Secondary | ICD-10-CM | POA: Insufficient documentation

## 2017-12-09 DIAGNOSIS — D72820 Lymphocytosis (symptomatic): Secondary | ICD-10-CM | POA: Diagnosis not present

## 2017-12-09 DIAGNOSIS — M419 Scoliosis, unspecified: Secondary | ICD-10-CM | POA: Diagnosis not present

## 2017-12-09 DIAGNOSIS — Z87891 Personal history of nicotine dependence: Secondary | ICD-10-CM | POA: Diagnosis not present

## 2017-12-09 LAB — CBC WITH DIFFERENTIAL/PLATELET
Basophils Absolute: 0 10*3/uL (ref 0–0.1)
Basophils Relative: 0 %
Eosinophils Absolute: 0.3 10*3/uL (ref 0–0.7)
Eosinophils Relative: 3 %
HCT: 36.5 % (ref 35.0–47.0)
HEMOGLOBIN: 12.1 g/dL (ref 12.0–16.0)
LYMPHS ABS: 4.9 10*3/uL — AB (ref 1.0–3.6)
LYMPHS PCT: 54 %
MCH: 31.3 pg (ref 26.0–34.0)
MCHC: 33.2 g/dL (ref 32.0–36.0)
MCV: 94.1 fL (ref 80.0–100.0)
MONO ABS: 0.6 10*3/uL (ref 0.2–0.9)
MONOS PCT: 6 %
NEUTROS ABS: 3.4 10*3/uL (ref 1.4–6.5)
Neutrophils Relative %: 37 %
PLATELETS: 317 10*3/uL (ref 150–440)
RBC: 3.88 MIL/uL (ref 3.80–5.20)
RDW: 13.6 % (ref 11.5–14.5)
WBC: 9.2 10*3/uL (ref 3.6–11.0)

## 2017-12-09 NOTE — Assessment & Plan Note (Addendum)
#   STAGE II  RIGHT BREAST CA/Lobular cancer- on adjuvant Arimidex since July 2014.  #  Clinically, no evidence of recurrence noted. Continue Arimidex patient tolerating Arimidex without any major side effects. Mammogram June 2018 normal. Will likely discuss stopping at next visit /July 2019.   # BMD- osteopenia- jan 2019; started on Fosomax [per endocrine]; also discussed re: Prolia/reclast if no improvement noted.   # Mild chronic [since 2012] Lymphocytosis- 4500- chronic unchanged; mild anemia hemoglobin of 12; monitor for now. 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 continue surveillance.  # Osteopenia/ BMD Dec 2016- Patient on calcium and vitamin D. Awaiting eval with endocrinologist-? BMD.    # follow up in 6 months/labs; or sooner based upon the above lab work results.  Cc; Dr.Aldridge.

## 2017-12-09 NOTE — Progress Notes (Signed)
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 Mild-Leucocytosis/lymphocytosis [? 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   # Post Polio neuropathy/wheel chair   # FEB 2019- T-score of -2.3 [osteopenia; Dr.Oconnell]     Carcinoma of overlapping sites of right breast in female, estrogen receptor positive ([place])     INTERVAL HISTORY:  A very pleasant 72 year old female patient with above history of polio/on a wheelchair with stage II breast cancer currently on adjuvant Arimidex; History of mild leukocytosis is here for follow-up.  In the interim patient was evaluated by endocrinology for bone density; had a bone density test.  Patient is recommended Fosamax.  Patient denies any unusual shortness of breath or cough. Denies any fevers or chills. Denies any lumps or bumps. Denies any infections. Denies any night sweats.  No hospitalizations.  Denies history of rheumatoid arthritis.   REVIEW OF SYSTEMS:  A complete 10 point review of system is done which is negative except mentioned above/history of present illness.   PAST MEDICAL HISTORY :  Past Medical History:  Diagnosis Date  . Arthritis    hands,knees,shoulders  . Breast cancer ([place]) 2013   with chemo  . Breast cancer, right breast ([place]) 09/2012   Right Breast, pT2,N0 (l+sn)  . Dysrhythmia    heart skipped beat per patient, checked by Dr Saralyn Pilar   . GERD (gastroesophageal reflux disease)   . Hyperlipidemia   . Leukocytosis   . Lymphocytosis    low-grade clonal T-cell disorder  . Malignant neoplasm of lower-inner quadrant of female breast [facility])    right, s/p chemotherapy  . 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 Osteopenia   . Osteoporosis   . Personal history of chemotherapy   . 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 PONV (postoperative nausea and vomiting) 1 time   after hysterectomy  . Reflux   . Scoliosis   . Seizures ([place]) x3   [facility] could find nothing wrong.Dec 2015  . Transfusion history     PAST SURGICAL HISTORY :   Past Surgical History:  Procedure Laterality Date  . ABDOMINAL HYSTERECTOMY  08/10/83   ovaries were not removed  . BACK SURGERY     x 2  . BREAST BIOPSY Right 2008, 2013   2008 negative, 2013 positive  . BREAST BIOPSY Left 04-24-14   Calcifications associated with stroma and sclerosing adenosis  . BREAST SURGERY Right 10/2012   Mastectomy with s/n bx, and radiation therapy  . CATARACT EXTRACTION W/PHACO Right 02/20/2015   Procedure: CATARACT EXTRACTION PHACO AND INTRAOCULAR LENS PLACEMENT (IOC);  Surgeon: Leandrew Koyanagi, MD;  Location: [place];  Service: Ophthalmology;  Laterality: Right;  . COLONOSCOPY  2011   Dr. Candace Cruise, 2 benign polyps removed  . COLONOSCOPY WITH PROPOFOL N/A 08/12/2015   Procedure: COLONOSCOPY WITH PROPOFOL;  Surgeon: Hulen Luster, MD;  Location: [facility] ENDOSCOPY;  Service: Gastroenterology;  Laterality: N/A;  . ESOPHAGOGASTRODUODENOSCOPY (EGD) WITH PROPOFOL N/A 08/12/2015   Procedure: ESOPHAGOGASTRODUODENOSCOPY (EGD) WITH PROPOFOL;  Surgeon: Hulen Luster, MD;  Location: [facility] ENDOSCOPY;  Service: Gastroenterology;  Laterality: N/A;  . FOOT SURGERY Right 1988  . LEG SURGERY Bilateral 1957, 1958  . MASTECTOMY Right   . PORTACATH PLACEMENT  10/2012   recently removed  . [place]   . TUBAL LIGATION      FAMILY HISTORY :   Family History  Problem Relation Age of Onset  . Breast cancer Sister 76  . Lung cancer Unknown     SOCIAL HISTORY:   Social History   Tobacco Use  . Smoking status: Former Smoker    Packs/day: 1.00    Years: 35.00    Pack years: 35.00    Types: Cigarettes    Last attempt to quit: 10/20/2003    Years since quitting: 14.1  . Smokeless tobacco: Never Used  . Tobacco comment: Smoking History stopped smoking in 2005. smoked 1 pk/per day x 32 yrs  Substance Use Topics  . Alcohol use: No    Alcohol/week: 0.0 oz  . Drug use: No    ALLERGIES:  is allergic to augmentin [amoxicillin-pot clavulanate] and macrobid [nitrofurantoin macrocrystal].  MEDICATIONS:  Current Outpatient Medications  Medication Sig Dispense Refill  . anastrozole (ARIMIDEX) 1 MG tablet Take 1 tablet (1 mg total) by mouth daily. Dinner time 90 tablet 3  . Calcium Carbonate (CALCIUM 600 PO) Take 1 tablet by mouth daily. Dinner    . cetirizine (ZYRTEC) 10 MG tablet Take 10 mg by mouth daily. AM    . Cholecalciferol (VITAMIN D3) 2000 UNITS TABS Take 1 tablet by mouth daily. Dinner    . famotidine (PEPCID) 20 MG tablet Take 20 mg by mouth daily. Am    . gabapentin (NEURONTIN) 300 MG capsule Take 1 capsule (300 mg total) by mouth 3 (three) times daily. 90 capsule 3  . ibuprofen (ADVIL,MOTRIN) 200 MG tablet Take 200 mg by mouth every 6 (six) hours as needed.    . Multiple Vitamin (MULTIVITAMIN) tablet Take 1 tablet by mouth daily. Dinner    . nicotine polacrilex (NICORETTE) 2 MG gum Take 2 mg by mouth as needed for smoking cessation.     No current facility-administered medications for this visit.     PHYSICAL EXAMINATION: ECOG PERFORMANCE STATUS: 3 - Symptomatic, >50% confined to bed  BP (!) 143/78 (BP Location: Left Arm, Patient Position: Sitting)   Pulse 89   Temp 97.9 F (36.6 C) (Tympanic)   Resp 16   There were no vitals filed for this  visit.  GENERAL: Well-nourished well-developed; Alert, no distress and comfortable.  She is in a wheelchair because of her polio. EYES: no pallor or icterus OROPHARYNX: no thrush or ulceration; good dentition  NECK: supple, no masses felt LYMPH:  no palpable lymphadenopathy in the cervical, axillary or inguinal regions LUNGS: clear to auscultation and  No wheeze or crackles HEART/CVS: regular rate & rhythm and no murmurs; No lower extremity edema ABDOMEN:abdomen soft, non-tender and normal bowel sounds Musculoskeletal:no cyanosis of digits and no clubbing  PSYCH: alert & oriented x 3 with fluent speech NEURO: no focal motor/sensory deficits SKIN:  no rashes or significant lesions;  Right and left BREAST exam [in the presence of nurse]-right mastectomy noted; no unusual skin changes or dominant masses felt. Surgical scars noted. Left breast-no lumps or bumps spelled.    LABORATORY DATA:  I have reviewed the data as listed    Component Value Date/Time   NA 141 05/28/2017 1437   NA 140 05/15/2013 1038   K 3.5 05/28/2017 1437   K 3.6 05/15/2013 1038   CL 104 05/28/2017 1437   CL 106 05/15/2013 1038   CO2 31 05/28/2017 1437   CO2 29 05/15/2013 1038   GLUCOSE 89 05/28/2017 1437   GLUCOSE 95 05/15/2013 1038   BUN 9 05/28/2017 1437   BUN 7 05/15/2013 1038   CREATININE 0.72 05/28/2017 1437   CREATININE 0.63 05/28/2014 1028   CALCIUM 8.8 (L) 05/28/2017 1437   CALCIUM 9.2 05/15/2013 1038   PROT 6.9 05/28/2017 1437   PROT 6.8 05/28/2014 1028   ALBUMIN 3.3 (L) 05/28/2017 1437   ALBUMIN 3.1 (L) 05/28/2014 1028   AST 22 05/28/2017 1437   AST 22 05/28/2014 1028   ALT 14 05/28/2017 1437   ALT 21 05/28/2014 1028   ALKPHOS 75 05/28/2017 1437   ALKPHOS 98 05/28/2014 1028   BILITOT 0.5 05/28/2017 1437   BILITOT 0.4 05/28/2014 1028   GFRNONAA >60 05/28/2017 1437   GFRNONAA >60 05/28/2014 1028   GFRAA >60 05/28/2017 1437   GFRAA >60 05/28/2014 1028    No results found for: SPEP,  UPEP  Lab Results  Component Value Date   WBC 9.2 12/09/2017   NEUTROABS 3.4 12/09/2017   HGB 12.1 12/09/2017   HCT 36.5 12/09/2017   MCV 94.1 12/09/2017   PLT 317 12/09/2017      Chemistry      Component Value Date/Time   NA 141 05/28/2017 1437   NA 140 05/15/2013 1038   K 3.5 05/28/2017 1437   K 3.6 05/15/2013 1038   CL 104 05/28/2017 1437   CL 106 05/15/2013 1038   CO2 31 05/28/2017 1437   CO2 29 05/15/2013 1038   BUN 9 05/28/2017 1437   BUN 7 05/15/2013 1038   CREATININE 0.72 05/28/2017 1437   CREATININE 0.63 05/28/2014 1028      Component Value Date/Time   CALCIUM 8.8 (L) 05/28/2017 1437   CALCIUM 9.2 05/15/2013 1038   ALKPHOS 75 05/28/2017 1437   ALKPHOS 98 05/28/2014 1028   AST 22 05/28/2017 1437   AST 22 05/28/2014 1028   ALT 14 05/28/2017 1437   ALT 21 05/28/2014 1028   BILITOT 0.5 05/28/2017 1437   BILITOT 0.4 05/28/2014 1028       RADIOGRAPHIC STUDIES: I have personally reviewed the radiological images as listed and agreed with the findings in the report. Dg Bone Density (dxa)  Result Date: 12/07/2017 EXAM: DUAL X-RAY ABSORPTIOMETRY (DXA) FOR BONE MINERAL DENSITY IMPRESSION: Dear Dr. Gearldine Shown, Your patient [name] completed a FRAX assessment on 12/07/2017 using the [place] (analysis version: 14.10) manufactured by [employer]. The following summarizes the results of our evaluation. PATIENT BIOGRAPHICAL: Name: Vola, Beneke Patient ID: [id number] Birth Date: 12/25/1945 Height:    60.0 in. Gender:     Female    [age]        71.7       Weight:    155.0 lbs. Ethnicity:  White                            Exam Date: 12/07/2017 FRAX* RESULTS:  (version: 3.5) 10-year Probability of Fracture1 Major Osteoporotic Fracture2 Hip Fracture 18.9% 8.5% Population: [place] (Caucasian) Risk Factors: Family Hist. (Parent hip fracture) Based on DualFemur (  Left) Neck BMD 1 -The 10-year probability of fracture may be lower than reported if the  patient has received treatment. 2 -Major Osteoporotic Fracture: Clinical Spine, Forearm, Hip or Shoulder *FRAX is a Materials engineer of the [employer] for Metabolic Bone Disease, a Fisher (WHO) [facility]. ASSESSMENT: The probability of a major osteoporotic fracture is 18.9% within the next ten years. The probability of a hip fracture is 8.5% within the next ten years. . Dear Dr. Titus Mould Santayana, Your patient Erenest Blank completed a BMD test on 12/07/2017 using the [place] (analysis version: 14.10) manufactured by [employer]. The following summarizes the results of our evaluation. PATIENT BIOGRAPHICAL: Name: Paysen, Goza Patient ID: [id number] Birth Date: [birth year] Height: 60.0 in. Gender: Female Exam Date: 12/07/2017 Weight: 155.0 lbs. Indications: Advanced Age, Breast CA, Caucasian, Height Loss, History of Chemo, History of Spinal Surgery, Hysterectomy, Postmenopausal, Scoliosis, Family Hist. (Parent hip fracture) Fractures: Treatments: Arimidex, Calcium, Gabapentin, Vitamin D ASSESSMENT: The BMD measured at Femur Total Left is 0.723 g/cm2 with a T-score of -2.3. This patient is considered osteopenic according to [facility]) criteria. Lumbar spine was not used due to previous spinal surgery and scoliosis. Site Region Measured Measured WHO Young Adult BMD Date       Age      Classification T-score DualFemur Total Left 12/07/2017 73.7 Osteopenia -2.3 0.723 g/cm2 DualFemur Total Left 10/01/2015 71.5 Osteopenia -2.2 0.727 g/cm2 DualFemur Total Left 04/04/2014 70.1 Osteopenia -1.9 0.770 g/cm2 DualFemur Total Left 07/01/2010 66.3 Osteopenia -1.8 0.786 g/cm2 DualFemur Total Mean 12/07/2017 73.7 Osteopenia -1.6 0.809 g/cm2 DualFemur Total Mean 10/01/2015 71.5 Osteopenia -1.4 0.836 g/cm2 DualFemur Total Mean 04/04/2014 70.1 Normal -0.8 0.908 g/cm2 DualFemur Total Mean 07/01/2010 66.3 Normal -0.8 0.904 g/cm2 Left  Forearm Radius 33% 12/07/2017 73.7 Osteopenia -1.2 0.768 g/cm2 Left Forearm Radius 33% 10/01/2015 71.5 Osteopenia -1.5 0.747 g/cm2 Left Forearm Radius 33% 04/04/2014 70.1 Normal -0.6 0.827 g/cm2 Left Forearm Radius 33% 07/01/2010 66.3 Normal -0.6 0.828 g/cm2 World Health Organization [facility]) criteria for post-menopausal, Caucasian Women: Normal:       T-score at or above -1 SD Osteopenia:   T-score between -1 and -2.5 SD Osteoporosis: T-score at or below -2.5 SD RECOMMENDATIONS: 1. All patients should optimize calcium and vitamin D intake. 2. Consider FDA-approved medical therapies in postmenopausal women and men aged 22 years and older, based on the following: a. A hip or vertebral(clinical or morphometric) fracture b. T-score < -2.5 at the femoral neck or spine after appropriate evaluation to exclude secondary causes c. Low bone mass (T-score between -1.0 and -2.5 at the femoral neck or spine) and a 10-year probability of a hip fracture > 3% or a 10-year probability of a major osteoporosis-related fracture > 20% based on the [place]-adapted WHO algorithm d. Clinician judgment and/or patient preferences may indicate treatment for people with 10-year fracture probabilities above or below these levels 3. Patients with diagnosis of osteoporosis or at high risk for fracture should have regular bone mineral density tests. For patients eligible for Medicare, routine testing is allowed once every 2 years. The testing frequency can be increased to one year for patients who have rapidly progressing disease, those who are receiving or discontinuing medical therapy to restore bone mass, or have additional risk factors. FOLLOW-UP: People with diagnosed cases of osteoporosis or at high risk for fracture should have regular bone mineral density tests. For patients eligible for Medicare, routine testing is allowed once every 2  years. The testing frequency can be increased to one year for patients who have rapidly progressing disease,  those who are receiving or discontinuing medical therapy to restore bone mass, or have additional risk factors. I have reviewed this report, and agree with the above findings. [facility] Electronically Signed   By: Fidela Salisbury M.D.   On: 12/07/2017 14:19     ASSESSMENT & PLAN:   Carcinoma of overlapping sites of right breast in female, estrogen receptor positive ([place]) # STAGE II  RIGHT BREAST CA/Lobular cancer- on adjuvant Arimidex since July 2014.  #  Clinically, no evidence of recurrence noted. Continue Arimidex patient tolerating Arimidex without any major side effects. Mammogram June 2018 normal. Will likely discuss stopping at next visit /July 2019.   # BMD- osteopenia- jan 2019; started on Fosomax [per endocrine]; also discussed re: Prolia/reclast if no improvement noted.   # Mild chronic [since 2012] Lymphocytosis- 4500- chronic unchanged; mild anemia hemoglobin of 12; monitor for now. 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 continue surveillance.  # Osteopenia/ BMD Dec 2016- Patient on calcium and vitamin D. Awaiting eval with endocrinologist-? BMD.    # follow up in 6 months/labs; or sooner based upon the above lab work results.  Cc; Dr.Aldridge.        Cammie Sickle, MD 12/09/2017 10:00 AM

## 2018-01-27 ENCOUNTER — Other Ambulatory Visit: Payer: Self-pay

## 2018-01-27 DIAGNOSIS — Z1231 Encounter for screening mammogram for malignant neoplasm of breast: Secondary | ICD-10-CM

## 2018-04-11 ENCOUNTER — Ambulatory Visit
Admission: RE | Admit: 2018-04-11 | Discharge: 2018-04-11 | Disposition: A | Discharge status: Home / Self Care | Payer: Medicare HMO | Source: Ambulatory Visit | Attending: General Surgery | Admitting: General Surgery

## 2018-04-11 DIAGNOSIS — Z1231 Encounter for screening mammogram for malignant neoplasm of breast: Secondary | ICD-10-CM | POA: Diagnosis not present

## 2018-04-19 ENCOUNTER — Ambulatory Visit: Payer: Medicare HMO | Admitting: General Surgery

## 2018-04-19 ENCOUNTER — Encounter: Payer: Self-pay | Admitting: General Surgery

## 2018-04-19 VITALS — BP 122/66 | HR 68 | Resp 17 | Ht 60.0 in | Wt 153.0 lb

## 2018-04-19 DIAGNOSIS — C50811 Malignant neoplasm of overlapping sites of right female breast: Secondary | ICD-10-CM

## 2018-04-19 DIAGNOSIS — Z170 Estrogen receptor positive status [ER+]: Secondary | ICD-10-CM

## 2018-04-19 NOTE — Progress Notes (Signed)
Patient ID: Erika Miller, female   DOB: July 20, 1946, 72 y.o.   MRN: [id number]  Chief Complaint  Patient presents with  . Follow-up    HPI Erika Miller is a 72 y.o. female who presents for a breast evaluation. The most recent left breast mammogram was done on 04/11/2018 .  Patient does perform regular self breast checks and gets regular mammograms done.  Husband, Juanda Crumble present at visit.   HPI  Past Medical History:  Diagnosis Date  . Arthritis    hands,knees,shoulders  . Breast cancer ([place]) 2013   with chemo  . Breast cancer, right breast ([place]) 09/2012   Right Breast, pT2,N0 (l+sn)  . Dysrhythmia    heart skipped beat per patient, checked by Dr Saralyn Pilar  . GERD (gastroesophageal reflux disease)   . Hyperlipidemia   . Leukocytosis   . Lymphocytosis    low-grade clonal T-cell disorder  . Malignant neoplasm of lower-inner quadrant of female breast [facility])    right, s/p chemotherapy  . 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 Osteopenia   . Osteoporosis   . Personal history of chemotherapy   . 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 PONV (postoperative nausea and vomiting) 1 time   after hysterectomy  . Reflux   . Scoliosis   . Seizures ([place]) x3   [facility] could find nothing wrong.Dec 2015  . Transfusion history     Past Surgical History:  Procedure Laterality Date  . ABDOMINAL HYSTERECTOMY  08/10/83   ovaries were not removed  . BACK SURGERY     x 2  . BREAST BIOPSY Right 2008, 2013   2008 negative, 2013 positive  . BREAST BIOPSY Left 04-24-14   Calcifications associated with stroma and sclerosing adenosis  . BREAST SURGERY Right 10/2012   Mastectomy with s/n bx, and radiation therapy  . CATARACT EXTRACTION W/PHACO Right 02/20/2015   Procedure: CATARACT EXTRACTION PHACO AND INTRAOCULAR LENS PLACEMENT (IOC);  Surgeon: Leandrew Koyanagi, MD;  Location: [place];  Service:  Ophthalmology;  Laterality: Right;  . COLONOSCOPY  2011   Dr. Candace Cruise, 2 benign polyps removed  . COLONOSCOPY WITH PROPOFOL N/A 08/12/2015   Procedure: COLONOSCOPY WITH PROPOFOL;  Surgeon: Hulen Luster, MD;  Location: [facility] ENDOSCOPY;  Service: Gastroenterology;  Laterality: N/A;  . ESOPHAGOGASTRODUODENOSCOPY (EGD) WITH PROPOFOL N/A 08/12/2015   Procedure: ESOPHAGOGASTRODUODENOSCOPY (EGD) WITH PROPOFOL;  Surgeon: Hulen Luster, MD;  [facility] ENDOSCOPY;  Service: Gastroenterology;  Laterality: N/A;  . FOOT SURGERY Right 1988  . LEG SURGERY Bilateral 1957, 1958  . MASTECTOMY Right   . PORTACATH PLACEMENT  10/2012   recently removed  . [place]   . TUBAL LIGATION      Family History  Problem Relation Age of Onset  . Breast cancer Sister 70  . Lung cancer Unknown     Social History Social History   Tobacco Use  . Smoking status: Former Smoker    Packs/day: 1.00    Years: 35.00    Pack years: 35.00    Types: Cigarettes    Last attempt to quit: 10/20/2003    Years since quitting: 14.5  . Smokeless tobacco: Never Used  . Tobacco comment: Smoking History stopped smoking in 2005. smoked 1 pk/per day x 32 yrs  Substance Use Topics  . Alcohol use: No    Alcohol/week: 0.0 oz  . Drug use: No  Allergies  Allergen Reactions  . Augmentin [Amoxicillin-Pot Clavulanate] Nausea And Vomiting  . Macrobid [Nitrofurantoin Macrocrystal] Rash    Current Outpatient Medications  Medication Sig Dispense Refill  . anastrozole (ARIMIDEX) 1 MG tablet Take 1 tablet (1 mg total) by mouth daily. Dinner time 90 tablet 3  . Calcium Carbonate (CALCIUM 600 PO) Take 1 tablet by mouth daily. Dinner    . cetirizine (ZYRTEC) 10 MG tablet Take 10 mg by mouth daily. AM    . Cholecalciferol (VITAMIN D3) 2000 UNITS TABS Take 1 tablet by mouth daily. Dinner    . famotidine (PEPCID) 20 MG tablet Take 20 mg by mouth daily. Am    . gabapentin (NEURONTIN) 300 MG capsule Take 1 capsule (300 mg total) by  mouth 3 (three) times daily. 90 capsule 3  . ibuprofen (ADVIL,MOTRIN) 200 MG tablet Take 200 mg by mouth every 6 (six) hours as needed.    . Multiple Vitamin (MULTIVITAMIN) tablet Take 1 tablet by mouth daily. Dinner    . nicotine polacrilex (NICORETTE) 2 MG gum Take 2 mg by mouth as needed for smoking cessation.     No current facility-administered medications for this visit.     Review of Systems Review of Systems  Constitutional: Negative.   Respiratory: Negative.   Cardiovascular: Negative.     Blood pressure 122/66, pulse 68, resp. rate 17, height 5' (1.524 m), weight 153 lb (69.4 kg).  Physical Exam Physical Exam  Constitutional: She is oriented to person, place, and time. She appears well-developed and well-nourished.  Eyes: Conjunctivae are normal. No scleral icterus.  Neck: Normal range of motion.  Cardiovascular: Normal rate, regular rhythm and normal heart sounds.  Pulmonary/Chest: Effort normal and breath sounds normal. Left breast exhibits no inverted nipple, no mass, no nipple discharge, no skin change and no tenderness.    Lymphadenopathy:    She has no cervical adenopathy.  Neurological: She is alert and oriented to person, place, and time.  Skin: Skin is warm and dry.    Data Reviewed Screening mammogram dated April 11, 2018 was reviewed.  BI-RADS-1.  Bone density test dated December 07, 2017.  Osteopenia.  Assessment    No evidence of recurrent cancer.    Plan    Patient will be asked to return to the office in one year with a left screening mammogram.The patient is aware to call back for any questions or concerns.  HPI, Physical Exam, Assessment and Plan have been scribed under the direction and in the presence of Hervey Ard, MD.  Gaspar Cola, CMA  . I have completed the exam and reviewed the above documentation for accuracy and completeness.  I agree with the above.  Haematologist has been used and any errors in dictation or  transcription are unintentional.  Hervey Ard, M.D., F.A.C.S. Forest Gleason Byrnett 04/20/2018, 1:01 PM

## 2018-04-19 NOTE — Patient Instructions (Signed)
Patient will be asked to return to the office in one year with al eft screening mammogram. The patient is aware to call back for any questions or concerns. 

## 2018-06-09 ENCOUNTER — Other Ambulatory Visit: Payer: Self-pay

## 2018-06-09 ENCOUNTER — Encounter: Payer: Self-pay | Admitting: Internal Medicine

## 2018-06-09 ENCOUNTER — Inpatient Hospital Stay (HOSPITAL_BASED_OUTPATIENT_CLINIC_OR_DEPARTMENT_OTHER): Payer: Medicare HMO | Admitting: Internal Medicine

## 2018-06-09 ENCOUNTER — Inpatient Hospital Stay: Payer: Medicare HMO | Attending: Internal Medicine

## 2018-06-09 VITALS — BP 132/80 | HR 76 | Temp 97.6°F | Resp 18 | Ht 60.0 in | Wt 153.0 lb

## 2018-06-09 DIAGNOSIS — Z170 Estrogen receptor positive status [ER+]: Secondary | ICD-10-CM

## 2018-06-09 DIAGNOSIS — Z87891 Personal history of nicotine dependence: Secondary | ICD-10-CM | POA: Diagnosis not present

## 2018-06-09 DIAGNOSIS — C50811 Malignant neoplasm of overlapping sites of right female breast: Secondary | ICD-10-CM

## 2018-06-09 DIAGNOSIS — Z923 Personal history of irradiation: Secondary | ICD-10-CM | POA: Insufficient documentation

## 2018-06-09 DIAGNOSIS — Z79899 Other long term (current) drug therapy: Secondary | ICD-10-CM | POA: Diagnosis not present

## 2018-06-09 DIAGNOSIS — Z79811 Long term (current) use of aromatase inhibitors: Secondary | ICD-10-CM | POA: Insufficient documentation

## 2018-06-09 DIAGNOSIS — D72820 Lymphocytosis (symptomatic): Secondary | ICD-10-CM | POA: Diagnosis not present

## 2018-06-09 DIAGNOSIS — M85859 Other specified disorders of bone density and structure, unspecified thigh: Secondary | ICD-10-CM

## 2018-06-09 DIAGNOSIS — M419 Scoliosis, unspecified: Secondary | ICD-10-CM | POA: Diagnosis not present

## 2018-06-09 DIAGNOSIS — Z9011 Acquired absence of right breast and nipple: Secondary | ICD-10-CM | POA: Diagnosis not present

## 2018-06-09 DIAGNOSIS — K219 Gastro-esophageal reflux disease without esophagitis: Secondary | ICD-10-CM | POA: Insufficient documentation

## 2018-06-09 DIAGNOSIS — M8580 Other specified disorders of bone density and structure, unspecified site: Secondary | ICD-10-CM

## 2018-06-09 DIAGNOSIS — E785 Hyperlipidemia, unspecified: Secondary | ICD-10-CM | POA: Diagnosis not present

## 2018-06-09 LAB — COMPREHENSIVE METABOLIC PANEL
ALBUMIN: 3.5 g/dL (ref 3.5–5.0)
ALK PHOS: 71 U/L (ref 38–126)
ALT: 12 U/L (ref 0–44)
AST: 22 U/L (ref 15–41)
Anion gap: 9 (ref 5–15)
BILIRUBIN TOTAL: 0.7 mg/dL (ref 0.3–1.2)
BUN: 10 mg/dL (ref 8–23)
CALCIUM: 9.4 mg/dL (ref 8.9–10.3)
CO2: 28 mmol/L (ref 22–32)
Chloride: 105 mmol/L (ref 98–111)
Creatinine, Ser: 0.45 mg/dL (ref 0.44–1.00)
GFR calc Af Amer: >60 mL/min (ref >60)
GFR calc non Af Amer: >60 mL/min (ref >60)
Glucose, Bld: 91 mg/dL (ref 70–99)
Potassium: 4.1 mmol/L (ref 3.5–5.1)
Sodium: 142 mmol/L (ref 135–145)
TOTAL PROTEIN: 7 g/dL (ref 6.5–8.1)

## 2018-06-09 LAB — CBC WITH DIFFERENTIAL/PLATELET
BASOS ABS: 0.1 10*3/uL (ref 0–0.1)
BASOS PCT: 1 %
EOS PCT: 3 %
Eosinophils Absolute: 0.3 10*3/uL (ref 0–0.7)
HCT: 35.5 % (ref 35.0–47.0)
Hemoglobin: 12 g/dL (ref 12.0–16.0)
LYMPHS PCT: 49 %
Lymphs Abs: 4.4 10*3/uL — ABNORMAL HIGH (ref 1.0–3.6)
MCH: 31.9 pg (ref 26.0–34.0)
MCHC: 33.9 g/dL (ref 32.0–36.0)
MCV: 93.9 fL (ref 80.0–100.0)
MONO ABS: 0.5 10*3/uL (ref 0.2–0.9)
MONOS PCT: 6 %
Neutro Abs: 3.7 10*3/uL (ref 1.4–6.5)
Neutrophils Relative %: 41 %
PLATELETS: 298 10*3/uL (ref 150–440)
RBC: 3.78 MIL/uL — ABNORMAL LOW (ref 3.80–5.20)
RDW: 13.6 % (ref 11.5–14.5)
WBC: 8.9 10*3/uL (ref 3.6–11.0)

## 2018-06-09 NOTE — Assessment & Plan Note (Addendum)
#   STAGE II  RIGHT BREAST CA/Lobular cancer- on adjuvant Arimidex since July 2014.  Stable.  No clinical evidence of recurrence.  Given the 5 years of adjuvant Arimidex I think is reasonable to discontinue Arimidex at this time.  Patient will stop Arimidex mid September 2019.   # BMD- osteopenia- jan 2019; did not start Fosamax because of concern for reflux/side effects.  Discussed the role of Prolia/discussed the mechanism of action potential side effects including osteonecrosis of jaw hypocalcemia.  Patient agreeable.  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  Monitor for now  #Prolia in 1 week; follow-up with me in 7 months labs/Prolia.  Cc; Dr.Aldridge.

## 2018-06-09 NOTE — Progress Notes (Signed)
[place] OFFICE PROGRESS NOTE  Patient Care Team: Gayland Curry, MD as PCP - General (Family Medicine) Bary Castilla, Forest Gleason, MD (General Surgery) Gayland Curry, MD as Referring Physician [facility] Medicine)  Cancer Staging No matching staging information was found for the patient.   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 Mild-Leucocytosis/lymphocytosis [? 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   # Post Polio neuropathy/wheel chair   # FEB 2019- T-score of -2.3 [osteopenia] AUG 2019- START PROLIA q 73M ---------------------------------------------  DIAGNOSIS: LOBULAR BREAST CA  STAGE:  II       ;GOALS: CURE  CURRENT/MOST RECENT THERAPY: Arimdex [sep 2019]      Malignant neoplasm of overlapping sites of right breast in female, estrogen receptor positive ([place])      INTERVAL HISTORY:  Erika Miller 72 y.o.  female pleasant patient above history of stage II lobular breast cancer currently on Arimidex is here for follow-up.   Denies any new lumps or bumps.  No shortness of breath cough.  No night sweats.  Review of Systems  Constitutional: Negative for chills, diaphoresis, fever, malaise/fatigue and weight loss.  HENT: Negative for nosebleeds and sore throat.   Eyes: Negative for double vision.  Respiratory: Negative for cough, hemoptysis, sputum production, shortness of breath and wheezing.   Cardiovascular: Negative for chest pain, palpitations, orthopnea and leg swelling.  Gastrointestinal: Negative for abdominal pain, blood in stool, constipation, diarrhea, heartburn, melena, nausea and vomiting.  Genitourinary: Negative for  dysuria, frequency and urgency.  Musculoskeletal: Negative for back pain and joint pain.  Skin: Negative.  Negative for itching and rash.  Neurological: Negative for dizziness, tingling, weakness and headaches. 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  Endo/Heme/Allergies: Does not bruise/bleed easily.  Psychiatric/Behavioral: Negative for depression. The patient is not nervous/anxious and does not have insomnia.       PAST MEDICAL HISTORY :  Past Medical History:  Diagnosis Date  . Arthritis    hands,knees,shoulders  . Breast cancer ([place]) 2013   with chemo  . Breast cancer, right breast ([place]) 09/2012   Right Breast, pT2,N0 (l+sn)  . Dysrhythmia    heart skipped beat per patient, checked by Dr Saralyn Pilar  . GERD (gastroesophageal reflux disease)   . Hyperlipidemia   . Leukocytosis   . Lymphocytosis    low-grade clonal T-cell disorder  . Malignant neoplasm of lower-inner quadrant of female breast [facility])    right, s/p chemotherapy  . 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 Osteopenia   . Osteoporosis   . Personal history of chemotherapy   . 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 PONV (postoperative nausea and vomiting) 1 time   after hysterectomy  . Reflux   . Scoliosis   . Seizures ([place]) x3   [facility] could find nothing wrong.Dec 2015  . Transfusion history     PAST SURGICAL HISTORY :   Past Surgical History:  Procedure Laterality Date  . ABDOMINAL HYSTERECTOMY  08/10/83   ovaries were not removed  . BACK SURGERY     x 2  .  BREAST BIOPSY Right 2008, 2013   2008 negative, 2013 positive  . BREAST BIOPSY Left 04-24-14   Calcifications associated with stroma and sclerosing adenosis  . BREAST SURGERY Right 10/2012   Mastectomy with s/n bx, and radiation therapy  . CATARACT EXTRACTION W/PHACO Right 02/20/2015   Procedure: CATARACT EXTRACTION PHACO AND  INTRAOCULAR LENS PLACEMENT (IOC);  Surgeon: Leandrew Koyanagi, MD;  Location: [place];  Service: Ophthalmology;  Laterality: Right;  . COLONOSCOPY  2011   Dr. Candace Cruise, 2 benign polyps removed  . COLONOSCOPY WITH PROPOFOL N/A 08/12/2015   Procedure: COLONOSCOPY WITH PROPOFOL;  Surgeon: Hulen Luster, MD;  Location[facility] ENDOSCOPY;  Service: Gastroenterology;  Laterality: N/A;  . ESOPHAGOGASTRODUODENOSCOPY (EGD) WITH PROPOFOL N/A 08/12/2015   Procedure: ESOPHAGOGASTRODUODENOSCOPY (EGD) WITH PROPOFOL;  Surgeon: Hulen Luster, MD;  Location: [facility] ENDOSCOPY;  Service: Gastroenterology;  Laterality: N/A;  . FOOT SURGERY Right 1988  . LEG SURGERY Bilateral 1957, 1958  . MASTECTOMY Right   . PORTACATH PLACEMENT  10/2012   recently removed  . [place]   . TUBAL LIGATION      FAMILY HISTORY :   Family History  Problem Relation Age of Onset  . Breast cancer Sister 78  . Lung cancer Unknown     SOCIAL HISTORY:   Social History   Tobacco Use  . Smoking status: Former Smoker    Packs/day: 1.00    Years: 35.00    Pack years: 35.00    Types: Cigarettes    Last attempt to quit: 10/20/2003    Years since quitting: 14.6  . Smokeless tobacco: Never Used  . Tobacco comment: Smoking History stopped smoking in 2005. smoked 1 pk/per day x 32 yrs  Substance Use Topics  . Alcohol use: No    Alcohol/week: 0.0 standard drinks  . Drug use: No    ALLERGIES:  is allergic to augmentin [amoxicillin-pot clavulanate] and macrobid [nitrofurantoin macrocrystal].  MEDICATIONS:  Current Outpatient Medications  Medication Sig Dispense Refill  . anastrozole (ARIMIDEX) 1 MG tablet Take 1 tablet (1 mg total) by mouth daily. Dinner time 90 tablet 3  . Calcium Carbonate (CALCIUM 600 PO) Take 1 tablet by mouth daily. Dinner    . cetirizine (ZYRTEC) 10 MG tablet Take 10 mg by mouth daily. AM    . Cholecalciferol (VITAMIN D3) 2000 UNITS TABS Take 1 tablet by mouth daily. Dinner    . famotidine  (PEPCID) 20 MG tablet Take 20 mg by mouth daily. Am    . gabapentin (NEURONTIN) 300 MG capsule Take 1 capsule (300 mg total) by mouth 3 (three) times daily. 90 capsule 3  . Multiple Vitamin (MULTIVITAMIN) tablet Take 1 tablet by mouth daily. Dinner    . neomycin-polymyxin b-dexamethasone (MAXITROL) 3.5-10000-0.1 OINT Place 1 application into the left eye at bedtime.  0  . nicotine polacrilex (NICORETTE) 2 MG gum Take 2 mg by mouth as needed for smoking cessation.    . prednisoLONE acetate (PRED FORTE) 1 % ophthalmic suspension Place 1 drop into the left eye daily.  0  . ibuprofen (ADVIL,MOTRIN) 200 MG tablet Take 200 mg by mouth every 6 (six) hours as needed.     No current facility-administered medications for this visit.     PHYSICAL EXAMINATION: ECOG PERFORMANCE STATUS: 2 - Symptomatic, <50% confined to bed  BP 132/80   Pulse 76   Temp 97.6 F (36.4 C) (Tympanic)   Resp 18   Ht 5' (1.524 m)   Wt 153  lb (69.4 kg) Comment: stated weight  BMI 29.88 kg/m   Filed Weights   06/09/18 1038  Weight: 153 lb (69.4 kg)    Physical Exam  Constitutional: She is oriented to person, place, and time and well-developed, well-nourished, and in no distress.  HENT:  Head: Normocephalic and atraumatic.  Mouth/Throat: Oropharynx is clear and moist. No oropharyngeal exudate.  Eyes: Pupils are equal, round, and reactive to light.  Neck: Normal range of motion. Neck supple.  Cardiovascular: Normal rate and regular rhythm.  Pulmonary/Chest: No respiratory distress. She has no wheezes.  Abdominal: Soft. Bowel sounds are normal. She exhibits no distension and no mass. There is no tenderness. There is no rebound and no guarding.  Musculoskeletal: Normal range of motion. She exhibits no edema or tenderness.  Neurological: She is alert and oriented to person, place, and time.  Chronic left lower extremity weakness muscle atrophy-secondary to polio.  Skin: Skin is warm.  Psychiatric: Affect normal.   ;    LABORATORY DATA:  I have reviewed the data as listed    Component Value Date/Time   NA 142 06/09/2018 0940   NA 140 05/15/2013 1038   K 4.1 06/09/2018 0940   K 3.6 05/15/2013 1038   CL 105 06/09/2018 0940   CL 106 05/15/2013 1038   CO2 28 06/09/2018 0940   CO2 29 05/15/2013 1038   GLUCOSE 91 06/09/2018 0940   GLUCOSE 95 05/15/2013 1038   BUN 10 06/09/2018 0940   BUN 7 05/15/2013 1038   CREATININE 0.45 06/09/2018 0940   CREATININE 0.63 05/28/2014 1028   CALCIUM 9.4 06/09/2018 0940   CALCIUM 9.2 05/15/2013 1038   PROT 7.0 06/09/2018 0940   PROT 6.8 05/28/2014 1028   ALBUMIN 3.5 06/09/2018 0940   ALBUMIN 3.1 (L) 05/28/2014 1028   AST 22 06/09/2018 0940   AST 22 05/28/2014 1028   ALT 12 06/09/2018 0940   ALT 21 05/28/2014 1028   ALKPHOS 71 06/09/2018 0940   ALKPHOS 98 05/28/2014 1028   BILITOT 0.7 06/09/2018 0940   BILITOT 0.4 05/28/2014 1028   GFRNONAA >60 06/09/2018 0940   GFRNONAA >60 05/28/2014 1028   GFRAA >60 06/09/2018 0940   GFRAA >60 05/28/2014 1028    No results found for: SPEP, UPEP  Lab Results  Component Value Date   WBC 8.9 06/09/2018   NEUTROABS 3.7 06/09/2018   HGB 12.0 06/09/2018   HCT 35.5 06/09/2018   MCV 93.9 06/09/2018   PLT 298 06/09/2018      Chemistry      Component Value Date/Time   NA 142 06/09/2018 0940   NA 140 05/15/2013 1038   K 4.1 06/09/2018 0940   K 3.6 05/15/2013 1038   CL 105 06/09/2018 0940   CL 106 05/15/2013 1038   CO2 28 06/09/2018 0940   CO2 29 05/15/2013 1038   BUN 10 06/09/2018 0940   BUN 7 05/15/2013 1038   CREATININE 0.45 06/09/2018 0940   CREATININE 0.63 05/28/2014 1028      Component Value Date/Time   CALCIUM 9.4 06/09/2018 0940   CALCIUM 9.2 05/15/2013 1038   ALKPHOS 71 06/09/2018 0940   ALKPHOS 98 05/28/2014 1028   AST 22 06/09/2018 0940   AST 22 05/28/2014 1028   ALT 12 06/09/2018 0940   ALT 21 05/28/2014 1028   BILITOT 0.7 06/09/2018 0940   BILITOT 0.4 05/28/2014 1028        RADIOGRAPHIC STUDIES: I have personally reviewed the radiological images as listed and agreed  with the findings in the report. No results found.   ASSESSMENT & PLAN:  Malignant neoplasm of overlapping sites of right breast in female, estrogen receptor positive ([place]) # STAGE II  RIGHT BREAST CA/Lobular cancer- on adjuvant Arimidex since July 2014.  Stable.  No clinical evidence of recurrence.  Given the 5 years of adjuvant Arimidex I think is reasonable to discontinue Arimidex at this time.  Patient will stop Arimidex mid September 2019.   # BMD- osteopenia- jan 2019; did not start Fosamax because of concern for reflux/side effects.  Discussed the role of Prolia/discussed the mechanism of action potential side effects including osteonecrosis of jaw hypocalcemia.  Patient agreeable.  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  Monitor for now  #Prolia in 1 week; follow-up with me in 7 months labs/Prolia.  Cc; Dr.Aldridge.    Orders Placed This Encounter  Procedures  . CBC with Differential    Standing Status:   Future    Standing Expiration Date:   06/10/2019  . Basic metabolic panel    Standing Status:   Future    Standing Expiration Date:   06/10/2019   All questions were answered. The patient knows to call the clinic with any problems, questions or concerns.      Cammie Sickle, MD 06/09/2018 9:29 PM

## 2018-06-16 ENCOUNTER — Ambulatory Visit: Payer: Medicare HMO

## 2018-06-17 ENCOUNTER — Inpatient Hospital Stay: Payer: Medicare HMO | Attending: Internal Medicine

## 2018-06-17 DIAGNOSIS — Z79811 Long term (current) use of aromatase inhibitors: Secondary | ICD-10-CM

## 2018-06-17 DIAGNOSIS — Z79899 Other long term (current) drug therapy: Secondary | ICD-10-CM | POA: Diagnosis not present

## 2018-06-17 DIAGNOSIS — Z9011 Acquired absence of right breast and nipple: Secondary | ICD-10-CM | POA: Insufficient documentation

## 2018-06-17 DIAGNOSIS — C50811 Malignant neoplasm of overlapping sites of right female breast: Secondary | ICD-10-CM | POA: Insufficient documentation

## 2018-06-17 DIAGNOSIS — Z170 Estrogen receptor positive status [ER+]: Secondary | ICD-10-CM | POA: Diagnosis not present

## 2018-06-17 DIAGNOSIS — M8580 Other specified disorders of bone density and structure, unspecified site: Secondary | ICD-10-CM | POA: Insufficient documentation

## 2018-06-17 DIAGNOSIS — Z923 Personal history of irradiation: Secondary | ICD-10-CM | POA: Insufficient documentation

## 2018-06-17 DIAGNOSIS — D72820 Lymphocytosis (symptomatic): Secondary | ICD-10-CM | POA: Diagnosis not present

## 2018-06-17 DIAGNOSIS — M85859 Other specified disorders of bone density and structure, unspecified thigh: Secondary | ICD-10-CM

## 2018-06-17 MED ORDER — DENOSUMAB 60 MG/ML ~~LOC~~ SOSY
60.0000 mg | PREFILLED_SYRINGE | Freq: Once | SUBCUTANEOUS | Status: AC
  Administered 2018-06-17: 60 mg via SUBCUTANEOUS
  Filled 2018-06-17: qty 1

## 2018-06-17 NOTE — Progress Notes (Signed)
Patient is here for Prolia injection last labs was 06/09/18, checked with Brandi H in insurance she states it should be fine and notified Brooke T with Dr. Aletha Halim team. Madaline Brilliant to give injection.

## 2019-01-06 ENCOUNTER — Ambulatory Visit: Payer: Medicare HMO

## 2019-01-06 ENCOUNTER — Other Ambulatory Visit: Payer: Medicare HMO

## 2019-01-06 ENCOUNTER — Ambulatory Visit: Payer: Medicare HMO | Admitting: Internal Medicine

## 2019-03-02 ENCOUNTER — Other Ambulatory Visit: Payer: Self-pay | Admitting: General Surgery

## 2019-03-02 DIAGNOSIS — Z1231 Encounter for screening mammogram for malignant neoplasm of breast: Secondary | ICD-10-CM

## 2019-03-08 ENCOUNTER — Other Ambulatory Visit: Payer: Medicare HMO

## 2019-03-08 ENCOUNTER — Ambulatory Visit: Payer: Medicare HMO

## 2019-03-08 ENCOUNTER — Ambulatory Visit: Payer: Medicare HMO | Admitting: Internal Medicine

## 2019-04-03 ENCOUNTER — Other Ambulatory Visit: Payer: Self-pay

## 2019-04-03 ENCOUNTER — Inpatient Hospital Stay (HOSPITAL_BASED_OUTPATIENT_CLINIC_OR_DEPARTMENT_OTHER): Payer: Medicare HMO | Admitting: Internal Medicine

## 2019-04-03 ENCOUNTER — Inpatient Hospital Stay: Payer: Medicare HMO

## 2019-04-03 ENCOUNTER — Inpatient Hospital Stay: Payer: Medicare HMO | Attending: Internal Medicine

## 2019-04-03 DIAGNOSIS — Z170 Estrogen receptor positive status [ER+]: Secondary | ICD-10-CM | POA: Insufficient documentation

## 2019-04-03 DIAGNOSIS — D72820 Lymphocytosis (symptomatic): Secondary | ICD-10-CM | POA: Diagnosis not present

## 2019-04-03 DIAGNOSIS — C50811 Malignant neoplasm of overlapping sites of right female breast: Secondary | ICD-10-CM

## 2019-04-03 DIAGNOSIS — M8580 Other specified disorders of bone density and structure, unspecified site: Secondary | ICD-10-CM | POA: Diagnosis present

## 2019-04-03 DIAGNOSIS — Z79811 Long term (current) use of aromatase inhibitors: Secondary | ICD-10-CM

## 2019-04-03 DIAGNOSIS — D649 Anemia, unspecified: Secondary | ICD-10-CM | POA: Insufficient documentation

## 2019-04-03 DIAGNOSIS — M85859 Other specified disorders of bone density and structure, unspecified thigh: Secondary | ICD-10-CM

## 2019-04-03 LAB — BASIC METABOLIC PANEL
Anion gap: 5 (ref 5–15)
BUN: 10 mg/dL (ref 8–23)
CO2: 31 mmol/L (ref 22–32)
Calcium: 8.5 mg/dL — ABNORMAL LOW (ref 8.9–10.3)
Chloride: 104 mmol/L (ref 98–111)
Creatinine, Ser: 0.57 mg/dL (ref 0.44–1.00)
GFR calc Af Amer: >60 mL/min (ref >60)
GFR calc non Af Amer: >60 mL/min (ref >60)
Glucose, Bld: 94 mg/dL (ref 70–99)
Potassium: 3.8 mmol/L (ref 3.5–5.1)
Sodium: 140 mmol/L (ref 135–145)

## 2019-04-03 LAB — CBC WITH DIFFERENTIAL/PLATELET
Abs Immature Granulocytes: 0.02 10*3/uL (ref 0.00–0.07)
Basophils Absolute: 0.1 10*3/uL (ref 0.0–0.1)
Basophils Relative: 1 %
Eosinophils Absolute: 0.3 10*3/uL (ref 0.0–0.5)
Eosinophils Relative: 3 %
HCT: 34.3 % — ABNORMAL LOW (ref 36.0–46.0)
Hemoglobin: 11.1 g/dL — ABNORMAL LOW (ref 12.0–15.0)
Immature Granulocytes: 0 %
Lymphocytes Relative: 54 %
Lymphs Abs: 5 10*3/uL — ABNORMAL HIGH (ref 0.7–4.0)
MCH: 31.4 pg (ref 26.0–34.0)
MCHC: 32.4 g/dL (ref 30.0–36.0)
MCV: 97.2 fL (ref 80.0–100.0)
Monocytes Absolute: 0.6 10*3/uL (ref 0.1–1.0)
Monocytes Relative: 7 %
Neutro Abs: 3.2 10*3/uL (ref 1.7–7.7)
Neutrophils Relative %: 35 %
Platelets: 229 10*3/uL (ref 150–400)
RBC: 3.53 MIL/uL — ABNORMAL LOW (ref 3.87–5.11)
RDW: 13.9 % (ref 11.5–15.5)
WBC: 9.2 10*3/uL (ref 4.0–10.5)
nRBC: 0 % (ref 0.0–0.2)

## 2019-04-03 MED ORDER — DENOSUMAB 60 MG/ML ~~LOC~~ SOSY
60.0000 mg | PREFILLED_SYRINGE | Freq: Once | SUBCUTANEOUS | Status: AC
  Administered 2019-04-03: 60 mg via SUBCUTANEOUS
  Filled 2019-04-03: qty 1

## 2019-04-03 NOTE — Progress Notes (Signed)
Patient reports not having much of an appetite and when she does eat she gets "full feeling" fast and can't eat any more.  She has lost 13 pounds since last visit in 05/2018

## 2019-04-03 NOTE — Progress Notes (Signed)
[place] OFFICE PROGRESS NOTE  Patient Care Team: Gayland Curry, MD as PCP - General (Family Medicine) Bary Castilla, Forest Gleason, MD (General Surgery) Gayland Curry, MD as Referring Physician [facility] Medicine)  Cancer Staging No matching staging information was found for the patient.   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   # AUG 2012-? Mild-Leucocytosis/lymphocytosis [? 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   # Post Polio neuropathy/wheel chair   # FEB 2019- T-score of -2.3 [osteopenia] AUG 2019- START PROLIA q 64M ---------------------------------------------  DIAGNOSIS: LOBULAR BREAST CA  STAGE:  II       ;GOALS: CURE  CURRENT/MOST RECENT THERAPY: Arimdex [sep 2019]    Malignant neoplasm of overlapping sites of right breast in female, estrogen receptor positive ([place])      INTERVAL HISTORY:  Erika Miller 73 y.o.  female pleasant patient above history of stage II lobular breast cancer currently on on surveillance is here for follow-up.  Patient finished Arimidex in September 2019.   Denies any new lumps or bumps.  No shortness of breath cough.  No night sweats.  Patient had episode of loose stool few weeks ago.  Currently resolved.  Review of Systems  Constitutional: Negative for chills, diaphoresis, fever, malaise/fatigue and weight loss.  HENT: Negative for nosebleeds and sore throat.   Eyes: Negative for double vision.  Respiratory: Negative for cough, hemoptysis, sputum production, shortness of breath and wheezing.   Cardiovascular: Negative for chest pain, palpitations, orthopnea and leg swelling.   Gastrointestinal: Negative for abdominal pain, blood in stool, constipation, diarrhea, heartburn, melena, nausea and vomiting.  Genitourinary: Negative for dysuria, frequency and urgency.  Musculoskeletal: Negative for back pain and joint pain.  Skin: Negative.  Negative for itching and rash.  Neurological: Negative for dizziness, tingling, weakness and headaches. 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  Endo/Heme/Allergies: Does not bruise/bleed easily.  Psychiatric/Behavioral: Negative for depression. The patient is not nervous/anxious and does not have insomnia.       PAST MEDICAL HISTORY :  Past Medical History:  Diagnosis Date  . Arthritis    hands,knees,shoulders  . Breast cancer ([place]) 2013   with chemo  . Breast cancer, right breast ([place]) 09/2012   Right Breast, pT2,N0 (l+sn)  . Dysrhythmia    heart skipped beat per patient, checked by Dr Saralyn Pilar  . GERD (gastroesophageal reflux disease)   . Hyperlipidemia   . Leukocytosis   . Lymphocytosis    low-grade clonal T-cell disorder  . Malignant neoplasm of lower-inner quadrant of female breast [facility])    right, s/p chemotherapy  . 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 Osteopenia   . Osteoporosis   . Personal history of chemotherapy   . 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 PONV (postoperative nausea and vomiting) 1 time   after hysterectomy  . Reflux   . Scoliosis   . Seizures ([place]) x3   [facility] could find nothing wrong.Dec 2015  . Transfusion history     PAST SURGICAL HISTORY :   Past Surgical History:  Procedure Laterality Date  .  ABDOMINAL HYSTERECTOMY  08/10/83   ovaries were not removed  . BACK SURGERY     x 2  . BREAST BIOPSY Right 2008, 2013   2008 negative, 2013 positive  . BREAST BIOPSY Left 04-24-14   Calcifications associated with stroma and sclerosing adenosis  . BREAST SURGERY  Right 10/2012   Mastectomy with s/n bx, and radiation therapy  . CATARACT EXTRACTION W/PHACO Right 02/20/2015   Procedure: CATARACT EXTRACTION PHACO AND INTRAOCULAR LENS PLACEMENT (IOC);  Surgeon: Leandrew Koyanagi, MD;  Location: [place];  Service: Ophthalmology;  Laterality: Right;  . COLONOSCOPY  2011   Dr. Candace Cruise, 2 benign polyps removed  . COLONOSCOPY WITH PROPOFOL N/A 08/12/2015   Procedure: COLONOSCOPY WITH PROPOFOL;  Surgeon: Hulen Luster, MD;  [facility] ENDOSCOPY;  Service: Gastroenterology;  Laterality: N/A;  . ESOPHAGOGASTRODUODENOSCOPY (EGD) WITH PROPOFOL N/A 08/12/2015   Procedure: ESOPHAGOGASTRODUODENOSCOPY (EGD) WITH PROPOFOL;  Surgeon: Hulen Luster, MD;  Location: [facility] ENDOSCOPY;  Service: Gastroenterology;  Laterality: N/A;  . FOOT SURGERY Right 1988  . LEG SURGERY Bilateral 1957, 1958  . MASTECTOMY Right   . PORTACATH PLACEMENT  10/2012   recently removed  . [place]   . TUBAL LIGATION      FAMILY HISTORY :   Family History  Problem Relation Age of Onset  . Breast cancer Sister 55  . Lung cancer Unknown     SOCIAL HISTORY:   Social History   Tobacco Use  . Smoking status: Former Smoker    Packs/day: 1.00    Years: 35.00    Pack years: 35.00    Types: Cigarettes    Quit date: 10/20/2003    Years since quitting: 15.4  . Smokeless tobacco: Never Used  . Tobacco comment: Smoking History stopped smoking in 2005. smoked 1 pk/per day x 32 yrs  Substance Use Topics  . Alcohol use: No    Alcohol/week: 0.0 standard drinks  . Drug use: No    ALLERGIES:  is allergic to augmentin [amoxicillin-pot clavulanate] and macrobid [nitrofurantoin macrocrystal].  MEDICATIONS:  Current Outpatient Medications  Medication Sig Dispense Refill  . aspirin EC 81 MG tablet Take by mouth.    . Calcium Carbonate (CALCIUM 600 PO) Take 1 tablet by mouth daily. Dinner    . cetirizine (ZYRTEC) 10 MG tablet Take 10 mg by mouth daily. AM    . Cholecalciferol  (VITAMIN D3) 2000 UNITS TABS Take 1 tablet by mouth daily. Dinner    . famotidine (PEPCID) 20 MG tablet Take 20 mg by mouth daily. Am    . gabapentin (NEURONTIN) 300 MG capsule Take 1 capsule (300 mg total) by mouth 3 (three) times daily. 90 capsule 3  . ibuprofen (ADVIL,MOTRIN) 200 MG tablet Take 200 mg by mouth every 6 (six) hours as needed.    . Multiple Vitamin (MULTIVITAMIN) tablet Take 1 tablet by mouth daily. Dinner    . anastrozole (ARIMIDEX) 1 MG tablet Take 1 tablet (1 mg total) by mouth daily. Dinner time (Patient not taking: Reported on 04/03/2019) 90 tablet 3  . neomycin-polymyxin b-dexamethasone (MAXITROL) 3.5-10000-0.1 OINT Place 1 application into the left eye at bedtime.  0  . nicotine polacrilex (NICORETTE) 2 MG gum Take 2 mg by mouth as needed for smoking cessation.    . prednisoLONE acetate (PRED FORTE) 1 % ophthalmic suspension Place 1 drop into the left eye daily.  0   No current facility-administered medications for this visit.     PHYSICAL EXAMINATION: ECOG  PERFORMANCE STATUS: 2 - Symptomatic, <50% confined to bed  BP (!) 131/91   Pulse 83   Temp (!) 95.6 F (35.3 C)   Resp 18   Wt 140 lb 9.6 oz (63.8 kg)   BMI 27.46 kg/m   Filed Weights   04/03/19 1425  Weight: 140 lb 9.6 oz (63.8 kg)    Physical Exam  Constitutional: She is oriented to person, place, and time and well-developed, well-nourished, and in no distress.  HENT:  Head: Normocephalic and atraumatic.  Mouth/Throat: Oropharynx is clear and moist. No oropharyngeal exudate.  Eyes: Pupils are equal, round, and reactive to light.  Neck: Normal range of motion. Neck supple.  Cardiovascular: Normal rate and regular rhythm.  Pulmonary/Chest: No respiratory distress. She has no wheezes.  Abdominal: Soft. Bowel sounds are normal. She exhibits no distension and no mass. There is no abdominal tenderness. There is no rebound and no guarding.  Musculoskeletal: Normal range of motion.        General: No  tenderness or edema.  Neurological: She is alert and oriented to person, place, and time.  Chronic left lower extremity weakness muscle atrophy-secondary to polio.  Skin: Skin is warm.  Psychiatric: Affect normal.  ;    LABORATORY DATA:  I have reviewed the data as listed    Component Value Date/Time   NA 140 04/03/2019 1406   NA 140 05/15/2013 1038   K 3.8 04/03/2019 1406   K 3.6 05/15/2013 1038   CL 104 04/03/2019 1406   CL 106 05/15/2013 1038   CO2 31 04/03/2019 1406   CO2 29 05/15/2013 1038   GLUCOSE 94 04/03/2019 1406   GLUCOSE 95 05/15/2013 1038   BUN 10 04/03/2019 1406   BUN 7 05/15/2013 1038   CREATININE 0.57 04/03/2019 1406   CREATININE 0.63 05/28/2014 1028   CALCIUM 8.5 (L) 04/03/2019 1406   CALCIUM 9.2 05/15/2013 1038   PROT 7.0 06/09/2018 0940   PROT 6.8 05/28/2014 1028   ALBUMIN 3.5 06/09/2018 0940   ALBUMIN 3.1 (L) 05/28/2014 1028   AST 22 06/09/2018 0940   AST 22 05/28/2014 1028   ALT 12 06/09/2018 0940   ALT 21 05/28/2014 1028   ALKPHOS 71 06/09/2018 0940   ALKPHOS 98 05/28/2014 1028   BILITOT 0.7 06/09/2018 0940   BILITOT 0.4 05/28/2014 1028   GFRNONAA >60 04/03/2019 1406   GFRNONAA >60 05/28/2014 1028   GFRAA >60 04/03/2019 1406   GFRAA >60 05/28/2014 1028    No results found for: SPEP, UPEP  Lab Results  Component Value Date   WBC 9.2 04/03/2019   NEUTROABS 3.2 04/03/2019   HGB 11.1 (L) 04/03/2019   HCT 34.3 (L) 04/03/2019   MCV 97.2 04/03/2019   PLT 229 04/03/2019      Chemistry      Component Value Date/Time   NA 140 04/03/2019 1406   NA 140 05/15/2013 1038   K 3.8 04/03/2019 1406   K 3.6 05/15/2013 1038   CL 104 04/03/2019 1406   CL 106 05/15/2013 1038   CO2 31 04/03/2019 1406   CO2 29 05/15/2013 1038   BUN 10 04/03/2019 1406   BUN 7 05/15/2013 1038   CREATININE 0.57 04/03/2019 1406   CREATININE 0.63 05/28/2014 1028      Component Value Date/Time   CALCIUM 8.5 (L) 04/03/2019 1406   CALCIUM 9.2 05/15/2013 1038    ALKPHOS 71 06/09/2018 0940   ALKPHOS 98 05/28/2014 1028   AST 22 06/09/2018 0940   AST  22 05/28/2014 1028   ALT 12 06/09/2018 0940   ALT 21 05/28/2014 1028   BILITOT 0.7 06/09/2018 0940   BILITOT 0.4 05/28/2014 1028       RADIOGRAPHIC STUDIES: I have personally reviewed the radiological images as listed and agreed with the findings in the report. No results found.   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   #  Stable no clinical evidence of recurrence.  Off arimidex; on surviellaince.  Awaiting mammogram end of the month/evaluation recommended.  # BMD- osteopenia- jan 2019-Mild hypocal 8.5; okay to proceed with Prolia today.  Recommend taking calcium plus vitamin D twice a day.  # Mild anemia- colo [Dr.Oh atleast loose stool x1; ? Resolved. Check Iron studies.   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  Monitor for now.  # DISPO: # add iron studies/ferritin to labs  Stool card x3.  # prolia today.  # follow up in 6 months- MD cbc/cmp/ ldh- Dr.B Cc; Dr.Aldridge.    No orders of the defined types were placed in this encounter.  All questions were answered. The patient knows to call the clinic with any problems, questions or concerns.      Cammie Sickle, MD 04/03/2019 5:06 PM

## 2019-04-03 NOTE — Assessment & Plan Note (Addendum)
#   STAGE II  RIGHT BREAST CA/Lobular cancer-status post adjuvant Arimidex finished September 2019.   #  Stable no clinical evidence of recurrence.  Off arimidex; on surviellaince.  Awaiting mammogram end of the month/evaluation recommended.  # BMD- osteopenia- jan 2019-Mild hypocal 8.5; okay to proceed with Prolia today.  Recommend taking calcium plus vitamin D twice a day.  # Mild anemia- colo [Dr.Oh atleast loose stool x1; ? Resolved. Check Iron studies.   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  Monitor for now.  # DISPO: # add iron studies/ferritin to labs  Stool card x3.  # prolia today.  # follow up in 6 months- MD cbc/cmp/ ldh- Dr.B Cc; Dr.Aldridge.

## 2019-04-04 ENCOUNTER — Other Ambulatory Visit: Payer: Self-pay | Admitting: *Deleted

## 2019-04-04 ENCOUNTER — Other Ambulatory Visit: Payer: Self-pay

## 2019-04-04 DIAGNOSIS — Z170 Estrogen receptor positive status [ER+]: Secondary | ICD-10-CM

## 2019-04-04 DIAGNOSIS — C50811 Malignant neoplasm of overlapping sites of right female breast: Secondary | ICD-10-CM

## 2019-04-04 DIAGNOSIS — D509 Iron deficiency anemia, unspecified: Secondary | ICD-10-CM

## 2019-04-04 LAB — IRON AND TIBC
Iron: 62 ug/dL (ref 28–170)
Saturation Ratios: 25 % (ref 10.4–31.8)
TIBC: 244 ug/dL — ABNORMAL LOW (ref 250–450)
UIBC: 182 ug/dL

## 2019-04-04 LAB — FERRITIN: Ferritin: 94 ng/mL (ref 11–307)

## 2019-04-13 ENCOUNTER — Other Ambulatory Visit: Payer: Self-pay

## 2019-04-13 ENCOUNTER — Ambulatory Visit
Admission: RE | Admit: 2019-04-13 | Discharge: 2019-04-13 | Disposition: A | Discharge status: Home / Self Care | Payer: Medicare HMO | Source: Ambulatory Visit | Attending: General Surgery | Admitting: General Surgery

## 2019-04-13 DIAGNOSIS — Z1231 Encounter for screening mammogram for malignant neoplasm of breast: Secondary | ICD-10-CM | POA: Diagnosis not present

## 2019-04-17 ENCOUNTER — Other Ambulatory Visit: Payer: Self-pay | Admitting: *Deleted

## 2019-04-17 DIAGNOSIS — Z1231 Encounter for screening mammogram for malignant neoplasm of breast: Secondary | ICD-10-CM

## 2019-04-17 DIAGNOSIS — C50811 Malignant neoplasm of overlapping sites of right female breast: Secondary | ICD-10-CM

## 2019-05-18 ENCOUNTER — Ambulatory Visit: Payer: Medicare HMO | Admitting: General Surgery

## 2019-05-22 ENCOUNTER — Ambulatory Visit: Payer: Medicare HMO | Admitting: Surgery

## 2019-06-12 ENCOUNTER — Ambulatory Visit: Payer: Medicare HMO | Admitting: Surgery

## 2019-10-02 ENCOUNTER — Other Ambulatory Visit: Payer: Self-pay

## 2019-10-03 ENCOUNTER — Inpatient Hospital Stay: Payer: Medicare HMO | Attending: Internal Medicine | Admitting: Internal Medicine

## 2019-10-03 ENCOUNTER — Other Ambulatory Visit: Payer: Self-pay | Admitting: *Deleted

## 2019-10-03 ENCOUNTER — Inpatient Hospital Stay: Payer: Medicare HMO

## 2019-10-03 ENCOUNTER — Other Ambulatory Visit: Payer: Self-pay

## 2019-10-03 VITALS — BP 128/78 | HR 101 | Temp 95.8°F | Wt 129.0 lb

## 2019-10-03 DIAGNOSIS — K219 Gastro-esophageal reflux disease without esophagitis: Secondary | ICD-10-CM | POA: Insufficient documentation

## 2019-10-03 DIAGNOSIS — M1990 Unspecified osteoarthritis, unspecified site: Secondary | ICD-10-CM | POA: Diagnosis not present

## 2019-10-03 DIAGNOSIS — Z79899 Other long term (current) drug therapy: Secondary | ICD-10-CM | POA: Diagnosis not present

## 2019-10-03 DIAGNOSIS — E785 Hyperlipidemia, unspecified: Secondary | ICD-10-CM | POA: Diagnosis not present

## 2019-10-03 DIAGNOSIS — C50811 Malignant neoplasm of overlapping sites of right female breast: Secondary | ICD-10-CM

## 2019-10-03 DIAGNOSIS — R634 Abnormal weight loss: Secondary | ICD-10-CM

## 2019-10-03 DIAGNOSIS — Z8612 Personal history of poliomyelitis: Secondary | ICD-10-CM | POA: Insufficient documentation

## 2019-10-03 DIAGNOSIS — Z170 Estrogen receptor positive status [ER+]: Secondary | ICD-10-CM

## 2019-10-03 DIAGNOSIS — Z853 Personal history of malignant neoplasm of breast: Secondary | ICD-10-CM | POA: Insufficient documentation

## 2019-10-03 DIAGNOSIS — Z9223 Personal history of estrogen therapy: Secondary | ICD-10-CM | POA: Insufficient documentation

## 2019-10-03 DIAGNOSIS — Z7982 Long term (current) use of aspirin: Secondary | ICD-10-CM | POA: Diagnosis not present

## 2019-10-03 DIAGNOSIS — Z9221 Personal history of antineoplastic chemotherapy: Secondary | ICD-10-CM | POA: Diagnosis not present

## 2019-10-03 LAB — CBC WITH DIFFERENTIAL/PLATELET
Abs Immature Granulocytes: 0.03 10*3/uL (ref 0.00–0.07)
Basophils Absolute: 0.1 10*3/uL (ref 0.0–0.1)
Basophils Relative: 1 %
Eosinophils Absolute: 0.3 10*3/uL (ref 0.0–0.5)
Eosinophils Relative: 3 %
HCT: 34.4 % — ABNORMAL LOW (ref 36.0–46.0)
Hemoglobin: 11.1 g/dL — ABNORMAL LOW (ref 12.0–15.0)
Immature Granulocytes: 0 %
Lymphocytes Relative: 43 %
Lymphs Abs: 5.1 10*3/uL — ABNORMAL HIGH (ref 0.7–4.0)
MCH: 31.5 pg (ref 26.0–34.0)
MCHC: 32.3 g/dL (ref 30.0–36.0)
MCV: 97.7 fL (ref 80.0–100.0)
Monocytes Absolute: 0.8 10*3/uL (ref 0.1–1.0)
Monocytes Relative: 7 %
Neutro Abs: 5.6 10*3/uL (ref 1.7–7.7)
Neutrophils Relative %: 46 %
Platelets: 244 10*3/uL (ref 150–400)
RBC: 3.52 MIL/uL — ABNORMAL LOW (ref 3.87–5.11)
RDW: 13.3 % (ref 11.5–15.5)
WBC: 12 10*3/uL — ABNORMAL HIGH (ref 4.0–10.5)
nRBC: 0 % (ref 0.0–0.2)

## 2019-10-03 LAB — COMPREHENSIVE METABOLIC PANEL
ALT: 10 U/L (ref 0–44)
AST: 20 U/L (ref 15–41)
Albumin: 3.3 g/dL — ABNORMAL LOW (ref 3.5–5.0)
Alkaline Phosphatase: 50 U/L (ref 38–126)
Anion gap: 7 (ref 5–15)
BUN: 10 mg/dL (ref 8–23)
CO2: 28 mmol/L (ref 22–32)
Calcium: 8.6 mg/dL — ABNORMAL LOW (ref 8.9–10.3)
Chloride: 104 mmol/L (ref 98–111)
Creatinine, Ser: 0.55 mg/dL (ref 0.44–1.00)
GFR calc Af Amer: >60 mL/min (ref >60)
GFR calc non Af Amer: >60 mL/min (ref >60)
Glucose, Bld: 90 mg/dL (ref 70–99)
Potassium: 4 mmol/L (ref 3.5–5.1)
Sodium: 139 mmol/L (ref 135–145)
Total Bilirubin: 0.7 mg/dL (ref 0.3–1.2)
Total Protein: 6.5 g/dL (ref 6.5–8.1)

## 2019-10-03 LAB — LACTATE DEHYDROGENASE: LDH: 116 U/L (ref 98–192)

## 2019-10-03 NOTE — Progress Notes (Signed)
[place] OFFICE PROGRESS NOTE  Patient Care Team: Gayland Curry, MD as PCP - General (Family Medicine) Bary Castilla, Forest Gleason, MD (General Surgery) Gayland Curry, MD as Referring Physician [facility] Medicine)  Cancer Staging No matching staging information was found for the patient.   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   # AUG 2012-? Mild-Leucocytosis/lymphocytosis [? 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   # Post Polio neuropathy/wheel chair   # FEB 2019- T-score of -2.3 [osteopenia] AUG 2019- START PROLIA q 98M ---------------------------------------------  DIAGNOSIS: LOBULAR BREAST CA  STAGE:  II       ;GOALS: CURE  CURRENT/MOST RECENT THERAPY: Arimdex [sep 2019]    Malignant neoplasm of overlapping sites of right breast in female, estrogen receptor positive ([place])      INTERVAL HISTORY:  Erika Miller 73 y.o.  female pleasant patient above history of stage II lobular breast cancer currently on on surveillance is here for follow-up.  Patient finished Arimidex in September 2019.  Patient admits to poor appetite and early satiety.  She admits to 30 pound weight loss without 6 months.  No nausea no vomiting.  Denies any new lumps or bumps.  Denies any worsening shortness of breath or cough.  No night sweats.  Complains of fatigue.  Review of Systems  Constitutional: Positive for malaise/fatigue and weight loss. Negative for chills, diaphoresis and fever.  HENT: Negative for nosebleeds and sore throat.   Eyes: Negative for double vision.  Respiratory: Negative for cough, hemoptysis, sputum production,  shortness of breath and wheezing.   Cardiovascular: Negative for chest pain, palpitations, orthopnea and leg swelling.  Gastrointestinal: Negative for abdominal pain, blood in stool, constipation, diarrhea, heartburn, melena, nausea and vomiting.  Genitourinary: Negative for dysuria, frequency and urgency.  Musculoskeletal: Negative for back pain and joint pain.  Skin: Negative.  Negative for itching and rash.  Neurological: Negative for dizziness, tingling, weakness and headaches. 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  Endo/Heme/Allergies: Does not bruise/bleed easily.  Psychiatric/Behavioral: Negative for depression. The patient is not nervous/anxious and does not have insomnia.      PAST MEDICAL HISTORY :  Past Medical History:  Diagnosis Date  . Arthritis    hands,knees,shoulders  . Breast cancer ([place]) 2013   with chemo  . Breast cancer, right breast ([place]) 09/2012   Right Breast, pT2,N0 (l+sn)  . Dysrhythmia    heart skipped beat per patient, checked by Dr Saralyn Pilar  . GERD (gastroesophageal reflux disease)   . Hyperlipidemia   . Leukocytosis   . Lymphocytosis    low-grade clonal T-cell disorder  . Malignant neoplasm of lower-inner quadrant of female breast [facility])    right, s/p chemotherapy  . 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 Osteopenia   . Osteoporosis   . Personal history of chemotherapy   . 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 PONV (postoperative nausea and vomiting) 1 time   after hysterectomy  . Reflux   . Scoliosis   . Seizures ([place]) x3   [facility] could find nothing wrong.Dec 2015  .  Transfusion history     PAST SURGICAL HISTORY :   Past Surgical History:  Procedure Laterality Date  . ABDOMINAL HYSTERECTOMY  08/10/83   ovaries were not removed  . BACK SURGERY     x 2  . BREAST BIOPSY Right 2008, 2013   2008 negative, 2013 positive   . BREAST BIOPSY Left 04-24-14   Calcifications associated with stroma and sclerosing adenosis  . BREAST SURGERY Right 10/2012   Mastectomy with s/n bx, and radiation therapy  . CATARACT EXTRACTION W/PHACO Right 02/20/2015   Procedure: CATARACT EXTRACTION PHACO AND INTRAOCULAR LENS PLACEMENT (IOC);  Surgeon: Leandrew Koyanagi, MD;  Location: [place];  Service: Ophthalmology;  Laterality: Right;  . COLONOSCOPY  2011   Dr. Candace Cruise, 2 benign polyps removed  . COLONOSCOPY WITH PROPOFOL N/A 08/12/2015   Procedure: COLONOSCOPY WITH PROPOFOL;  Surgeon: Hulen Luster, MD;  Location: [facility] ENDOSCOPY;  Service: Gastroenterology;  Laterality: N/A;  . ESOPHAGOGASTRODUODENOSCOPY (EGD) WITH PROPOFOL N/A 08/12/2015   Procedure: ESOPHAGOGASTRODUODENOSCOPY (EGD) WITH PROPOFOL;  Surgeon: Hulen Luster, MD;  Location: [facility] ENDOSCOPY;  Service: Gastroenterology;  Laterality: N/A;  . FOOT SURGERY Right 1988  . LEG SURGERY Bilateral 1957, 1958  . MASTECTOMY Right   . PORTACATH PLACEMENT  10/2012   recently removed  . [place]   . TUBAL LIGATION      FAMILY HISTORY :   Family History  Problem Relation Age of Onset  . Breast cancer Sister 67  . Lung cancer Other     SOCIAL HISTORY:   Social History   Tobacco Use  . Smoking status: Former Smoker    Packs/day: 1.00    Years: 35.00    Pack years: 35.00    Types: Cigarettes    Quit date: 10/20/2003    Years since quitting: 15.9  . Smokeless tobacco: Never Used  . Tobacco comment: Smoking History stopped smoking in 2005. smoked 1 pk/per day x 32 yrs  Substance Use Topics  . Alcohol use: No    Alcohol/week: 0.0 standard drinks  . Drug use: No    ALLERGIES:  is allergic to augmentin [amoxicillin-pot clavulanate] and macrobid [nitrofurantoin macrocrystal].  MEDICATIONS:  Current Outpatient Medications  Medication Sig Dispense Refill  . anastrozole (ARIMIDEX) 1 MG tablet Take 1 tablet (1 mg total) by mouth daily. Dinner time (Patient  not taking: Reported on 04/03/2019) 90 tablet 3  . aspirin EC 81 MG tablet Take by mouth.    . Calcium Carbonate (CALCIUM 600 PO) Take 1 tablet by mouth daily. Dinner    . cetirizine (ZYRTEC) 10 MG tablet Take 10 mg by mouth daily. AM    . Cholecalciferol (VITAMIN D3) 2000 UNITS TABS Take 1 tablet by mouth daily. Dinner    . famotidine (PEPCID) 20 MG tablet Take 20 mg by mouth daily. Am    . gabapentin (NEURONTIN) 300 MG capsule Take 1 capsule (300 mg total) by mouth 3 (three) times daily. 90 capsule 3  . ibuprofen (ADVIL,MOTRIN) 200 MG tablet Take 200 mg by mouth every 6 (six) hours as needed.    . Multiple Vitamin (MULTIVITAMIN) tablet Take 1 tablet by mouth daily. Dinner    . neomycin-polymyxin b-dexamethasone (MAXITROL) 3.5-10000-0.1 OINT Place 1 application into the left eye at bedtime.  0  . nicotine polacrilex (NICORETTE) 2 MG gum Take 2 mg by mouth as needed for smoking cessation.    . prednisoLONE acetate (PRED FORTE) 1 % ophthalmic suspension Place 1 drop into the  left eye daily.  0   No current facility-administered medications for this visit.    PHYSICAL EXAMINATION: ECOG PERFORMANCE STATUS: 2 - Symptomatic, <50% confined to bed  BP 128/78 (BP Location: Left Arm, Patient Position: Sitting)   Pulse (!) 101   Temp (!) 95.8 F (35.4 C) (Tympanic)   Wt 129 lb (58.5 kg)   BMI 25.19 kg/m   Filed Weights   10/03/19 1354  Weight: 129 lb (58.5 kg)    Physical Exam  Constitutional: She is oriented to person, place, and time.  In a wheelchair.  Chronic/polio.  Alone.   Thin built; frail appearing.  HENT:  Head: Normocephalic and atraumatic.  Mouth/Throat: Oropharynx is clear and moist. No oropharyngeal exudate.  Eyes: Pupils are equal, round, and reactive to light.  Cardiovascular: Normal rate and regular rhythm.  Pulmonary/Chest: Effort normal and breath sounds normal. No respiratory distress. She has no wheezes.  Abdominal: Soft. Bowel sounds are normal. She exhibits no  distension and no mass. There is no abdominal tenderness. There is no rebound and no guarding.  Musculoskeletal:        General: No tenderness or edema. Normal range of motion.     Cervical back: Normal range of motion and neck supple.  Neurological: She is alert and oriented to person, place, and time.  Chronic left lower extremity weakness muscle atrophy-secondary to polio.  Skin: Skin is warm.  Psychiatric: Affect normal.  ;    LABORATORY DATA:  I have reviewed the data as listed    Component Value Date/Time   NA 139 10/03/2019 1337   NA 140 05/15/2013 1038   K 4.0 10/03/2019 1337   K 3.6 05/15/2013 1038   CL 104 10/03/2019 1337   CL 106 05/15/2013 1038   CO2 28 10/03/2019 1337   CO2 29 05/15/2013 1038   GLUCOSE 90 10/03/2019 1337   GLUCOSE 95 05/15/2013 1038   BUN 10 10/03/2019 1337   BUN 7 05/15/2013 1038   CREATININE 0.55 10/03/2019 1337   CREATININE 0.63 05/28/2014 1028   CALCIUM 8.6 (L) 10/03/2019 1337   CALCIUM 9.2 05/15/2013 1038   PROT 6.5 10/03/2019 1337   PROT 6.8 05/28/2014 1028   ALBUMIN 3.3 (L) 10/03/2019 1337   ALBUMIN 3.1 (L) 05/28/2014 1028   AST 20 10/03/2019 1337   AST 22 05/28/2014 1028   ALT 10 10/03/2019 1337   ALT 21 05/28/2014 1028   ALKPHOS 50 10/03/2019 1337   ALKPHOS 98 05/28/2014 1028   BILITOT 0.7 10/03/2019 1337   BILITOT 0.4 05/28/2014 1028   GFRNONAA >60 10/03/2019 1337   GFRNONAA >60 05/28/2014 1028   GFRAA >60 10/03/2019 1337   GFRAA >60 05/28/2014 1028    No results found for: SPEP, UPEP  Lab Results  Component Value Date   WBC 12.0 (H) 10/03/2019   NEUTROABS 5.6 10/03/2019   HGB 11.1 (L) 10/03/2019   HCT 34.4 (L) 10/03/2019   MCV 97.7 10/03/2019   PLT 244 10/03/2019      Chemistry      Component Value Date/Time   NA 139 10/03/2019 1337   NA 140 05/15/2013 1038   K 4.0 10/03/2019 1337   K 3.6 05/15/2013 1038   CL 104 10/03/2019 1337   CL 106 05/15/2013 1038   CO2 28 10/03/2019 1337   CO2 29 05/15/2013 1038    BUN 10 10/03/2019 1337   BUN 7 05/15/2013 1038   CREATININE 0.55 10/03/2019 1337   CREATININE 0.63 05/28/2014 1028  Component Value Date/Time   CALCIUM 8.6 (L) 10/03/2019 1337   CALCIUM 9.2 05/15/2013 1038   ALKPHOS 50 10/03/2019 1337   ALKPHOS 98 05/28/2014 1028   AST 20 10/03/2019 1337   AST 22 05/28/2014 1028   ALT 10 10/03/2019 1337   ALT 21 05/28/2014 1028   BILITOT 0.7 10/03/2019 1337   BILITOT 0.4 05/28/2014 1028       RADIOGRAPHIC STUDIES: I have personally reviewed the radiological images as listed and agreed with the findings in the report. No results found.   ASSESSMENT & PLAN:  Malignant neoplasm of overlapping sites of right breast in female, estrogen receptor positive ([place]) # STAGE II  RIGHT BREAST CA/Lobular cancer-status post adjuvant Arimidex finished September 2019. June 2020- mammogram- WNL;   STABLE; but see below;   # weight loss- 30 pounds/ in last 6 months; ? Etiology recommend CT C/A/P given lymphocytosis/ rising; more fatigue.  # Mild lymphocytosis- WBC-12; ALC- 5; Gamma delta (GD) T cell lymphocytosis detected, with expression of CD57, a  marker of large granular lymphocytes (LGLs). Await above work up.  # BMD- osteopenia- jan 2019-Mild hypocal 8.5; HOLD prolia today.   continue calcium plus vitamin D twice a day.   # DISPO: will with results # CT C/A/P asap-   # Follow up TBD- Dr.B    Orders Placed This Encounter  Procedures  . CT Chest W Contrast    Standing Status:   Future    Standing Expiration Date:   10/02/2020    Order Specific Question:   If indicated for the ordered procedure, I authorize the administration of contrast media per Radiology protocol    Answer:   Yes    Order Specific Question:   Preferred imaging location?    Answer:   [facility]    Order Specific Question:   Radiology Contrast Protocol - do NOT remove file path    Answer:   \\[email or web address]    Order Specific Question:   **  REASON FOR EXAM (FREE TEXT)    Answer:   abnormal weight loss  . CT ABDOMEN PELVIS W CONTRAST    Standing Status:   Future    Standing Expiration Date:   10/02/2020    Order Specific Question:   If indicated for the ordered procedure, I authorize the administration of contrast media per Radiology protocol    Answer:   Yes    Order Specific Question:   Preferred imaging location?    Answer:   [facility]    Order Specific Question:   Radiology Contrast Protocol - do NOT remove file path    Answer:   \\[email or web address]    Order Specific Question:   ** REASON FOR EXAM (FREE TEXT)    Answer:   abnormal weight loss   All questions were answered. The patient knows to call the clinic with any problems, questions or concerns.      Cammie Sickle, MD 10/04/2019 10:31 AM

## 2019-10-03 NOTE — Assessment & Plan Note (Addendum)
#   STAGE II  RIGHT BREAST CA/Lobular cancer-status post adjuvant Arimidex finished September 2019. June 2020- mammogram- WNL;   STABLE; but see below;   # weight loss- 30 pounds/ in last 6 months; ? Etiology recommend CT C/A/P given lymphocytosis/ rising; more fatigue.   # Mild lymphocytosis- WBC-12; ALC- 5; Gamma delta (GD) T cell lymphocytosis detected, with expression of CD57, a  marker of large granular lymphocytes (LGLs). Await above work up.  # BMD- osteopenia- jan 2019-Mild hypocal 8.5; HOLD prolia today.   continue calcium plus vitamin D twice a day.   # DISPO: will with results # CT C/A/P asap-   # Follow up TBD- Dr.B

## 2019-10-17 ENCOUNTER — Other Ambulatory Visit: Payer: Self-pay

## 2019-10-17 ENCOUNTER — Ambulatory Visit
Admission: RE | Admit: 2019-10-17 | Discharge: 2019-10-17 | Disposition: A | Discharge status: Home / Self Care | Payer: Medicare HMO | Source: Ambulatory Visit | Attending: Internal Medicine | Admitting: Internal Medicine

## 2019-10-17 DIAGNOSIS — Z170 Estrogen receptor positive status [ER+]: Secondary | ICD-10-CM | POA: Insufficient documentation

## 2019-10-17 DIAGNOSIS — R634 Abnormal weight loss: Secondary | ICD-10-CM | POA: Diagnosis present

## 2019-10-17 DIAGNOSIS — C50811 Malignant neoplasm of overlapping sites of right female breast: Secondary | ICD-10-CM | POA: Diagnosis not present

## 2019-10-17 MED ORDER — IOHEXOL 300 MG/ML  SOLN
75.0000 mL | Freq: Once | INTRAMUSCULAR | Status: AC | PRN
  Administered 2019-10-17: 75 mL via INTRAVENOUS

## 2019-10-18 ENCOUNTER — Telehealth: Payer: Self-pay | Admitting: Internal Medicine

## 2019-10-18 DIAGNOSIS — C50811 Malignant neoplasm of overlapping sites of right female breast: Secondary | ICD-10-CM

## 2019-10-18 DIAGNOSIS — R634 Abnormal weight loss: Secondary | ICD-10-CM

## 2019-10-18 NOTE — Telephone Encounter (Signed)
Referral faxed to [facility] gi

## 2019-10-18 NOTE — Telephone Encounter (Signed)
C-Please send referral. And sch. f/us

## 2019-10-18 NOTE — Telephone Encounter (Signed)
12/29-spoke to patient regarding results of the CT scan-normal; no explanation for ongoing weight loss/poor appetite.  Recommend EGD for further evaluation.  Patient had EGDs prior with Dr. Gustavo Lah;.  # Please make a referral to [facility] GI Dr.[place] ASAP re: weight loss/need for EGD  # Please schedule follow up with me in 2 months; MD; labs- cbc/cmp/LDH  Thanks GB

## 2019-10-18 NOTE — Addendum Note (Signed)
Addended by: Sabino Gasser on: 10/18/2019 08:28 AM   Modules accepted: Orders

## 2019-10-26 ENCOUNTER — Other Ambulatory Visit: Payer: Self-pay

## 2019-10-26 ENCOUNTER — Other Ambulatory Visit
Admission: RE | Admit: 2019-10-26 | Discharge: 2019-10-26 | Disposition: A | Discharge status: Home / Self Care | Payer: Medicare HMO | Source: Ambulatory Visit | Attending: Internal Medicine | Admitting: Internal Medicine

## 2019-10-26 DIAGNOSIS — Z01812 Encounter for preprocedural laboratory examination: Secondary | ICD-10-CM | POA: Diagnosis present

## 2019-10-26 DIAGNOSIS — Z20822 Contact with and (suspected) exposure to covid-19: Secondary | ICD-10-CM | POA: Insufficient documentation

## 2019-10-27 LAB — SARS CORONAVIRUS 2 (TAT 6-24 HRS): SARS Coronavirus 2: NEGATIVE

## 2019-10-30 ENCOUNTER — Ambulatory Visit: Payer: Medicare HMO | Admitting: Anesthesiology

## 2019-10-30 ENCOUNTER — Ambulatory Visit
Admission: RE | Admit: 2019-10-30 | Discharge: 2019-10-30 | Disposition: A | Discharge status: Home / Self Care | Payer: Medicare HMO | Attending: Internal Medicine | Admitting: Internal Medicine

## 2019-10-30 ENCOUNTER — Encounter: Payer: Self-pay | Admitting: Internal Medicine

## 2019-10-30 ENCOUNTER — Other Ambulatory Visit: Payer: Self-pay

## 2019-10-30 ENCOUNTER — Encounter
Admission: RE | Disposition: A | Discharge status: Home / Self Care | Payer: Self-pay | Source: Home / Self Care | Attending: Internal Medicine

## 2019-10-30 DIAGNOSIS — K219 Gastro-esophageal reflux disease without esophagitis: Secondary | ICD-10-CM | POA: Insufficient documentation

## 2019-10-30 DIAGNOSIS — R569 Unspecified convulsions: Secondary | ICD-10-CM | POA: Diagnosis not present

## 2019-10-30 DIAGNOSIS — K640 First degree hemorrhoids: Secondary | ICD-10-CM | POA: Diagnosis not present

## 2019-10-30 DIAGNOSIS — M8580 Other specified disorders of bone density and structure, unspecified site: Secondary | ICD-10-CM | POA: Insufficient documentation

## 2019-10-30 DIAGNOSIS — K5730 Diverticulosis of large intestine without perforation or abscess without bleeding: Secondary | ICD-10-CM | POA: Diagnosis not present

## 2019-10-30 DIAGNOSIS — K222 Esophageal obstruction: Secondary | ICD-10-CM | POA: Insufficient documentation

## 2019-10-30 DIAGNOSIS — Z961 Presence of intraocular lens: Secondary | ICD-10-CM | POA: Insufficient documentation

## 2019-10-30 DIAGNOSIS — Z6823 Body mass index (BMI) 23.0-23.9, adult: Secondary | ICD-10-CM | POA: Diagnosis not present

## 2019-10-30 DIAGNOSIS — R634 Abnormal weight loss: Secondary | ICD-10-CM | POA: Diagnosis not present

## 2019-10-30 DIAGNOSIS — Z1211 Encounter for screening for malignant neoplasm of colon: Secondary | ICD-10-CM | POA: Insufficient documentation

## 2019-10-30 DIAGNOSIS — Z880 Allergy status to penicillin: Secondary | ICD-10-CM | POA: Diagnosis not present

## 2019-10-30 DIAGNOSIS — Z9221 Personal history of antineoplastic chemotherapy: Secondary | ICD-10-CM | POA: Insufficient documentation

## 2019-10-30 DIAGNOSIS — Z79899 Other long term (current) drug therapy: Secondary | ICD-10-CM | POA: Diagnosis not present

## 2019-10-30 DIAGNOSIS — Z881 Allergy status to other antibiotic agents status: Secondary | ICD-10-CM | POA: Insufficient documentation

## 2019-10-30 DIAGNOSIS — M810 Age-related osteoporosis without current pathological fracture: Secondary | ICD-10-CM | POA: Insufficient documentation

## 2019-10-30 DIAGNOSIS — K449 Diaphragmatic hernia without obstruction or gangrene: Secondary | ICD-10-CM | POA: Insufficient documentation

## 2019-10-30 DIAGNOSIS — K3189 Other diseases of stomach and duodenum: Secondary | ICD-10-CM | POA: Diagnosis not present

## 2019-10-30 DIAGNOSIS — E785 Hyperlipidemia, unspecified: Secondary | ICD-10-CM | POA: Diagnosis not present

## 2019-10-30 DIAGNOSIS — D72829 Elevated white blood cell count, unspecified: Secondary | ICD-10-CM | POA: Insufficient documentation

## 2019-10-30 DIAGNOSIS — Z87891 Personal history of nicotine dependence: Secondary | ICD-10-CM | POA: Diagnosis not present

## 2019-10-30 DIAGNOSIS — Z993 Dependence on wheelchair: Secondary | ICD-10-CM | POA: Insufficient documentation

## 2019-10-30 DIAGNOSIS — D72820 Lymphocytosis (symptomatic): Secondary | ICD-10-CM | POA: Diagnosis not present

## 2019-10-30 DIAGNOSIS — Z7952 Long term (current) use of systemic steroids: Secondary | ICD-10-CM | POA: Diagnosis not present

## 2019-10-30 DIAGNOSIS — Z9841 Cataract extraction status, right eye: Secondary | ICD-10-CM | POA: Insufficient documentation

## 2019-10-30 DIAGNOSIS — Z791 Long term (current) use of non-steroidal anti-inflammatories (NSAID): Secondary | ICD-10-CM | POA: Insufficient documentation

## 2019-10-30 DIAGNOSIS — Z86010 Personal history of colonic polyps: Secondary | ICD-10-CM | POA: Insufficient documentation

## 2019-10-30 DIAGNOSIS — Z90710 Acquired absence of both cervix and uterus: Secondary | ICD-10-CM | POA: Insufficient documentation

## 2019-10-30 DIAGNOSIS — Z7982 Long term (current) use of aspirin: Secondary | ICD-10-CM | POA: Insufficient documentation

## 2019-10-30 DIAGNOSIS — M1990 Unspecified osteoarthritis, unspecified site: Secondary | ICD-10-CM | POA: Diagnosis not present

## 2019-10-30 DIAGNOSIS — K2950 Unspecified chronic gastritis without bleeding: Secondary | ICD-10-CM | POA: Insufficient documentation

## 2019-10-30 DIAGNOSIS — Z853 Personal history of malignant neoplasm of breast: Secondary | ICD-10-CM | POA: Diagnosis not present

## 2019-10-30 DIAGNOSIS — M419 Scoliosis, unspecified: Secondary | ICD-10-CM | POA: Insufficient documentation

## 2019-10-30 HISTORY — PX: COLONOSCOPY WITH PROPOFOL: SHX5780

## 2019-10-30 HISTORY — PX: ESOPHAGOGASTRODUODENOSCOPY (EGD) WITH PROPOFOL: SHX5813

## 2019-10-30 SURGERY — COLONOSCOPY WITH PROPOFOL
Anesthesia: General

## 2019-10-30 MED ORDER — PHENYLEPHRINE HCL (PRESSORS) 10 MG/ML IV SOLN
INTRAVENOUS | Status: DC | PRN
  Administered 2019-10-30 (×2): 100 ug via INTRAVENOUS

## 2019-10-30 MED ORDER — PROPOFOL 500 MG/50ML IV EMUL
INTRAVENOUS | Status: AC
  Filled 2019-10-30: qty 50

## 2019-10-30 MED ORDER — PROPOFOL 10 MG/ML IV BOLUS
INTRAVENOUS | Status: DC | PRN
  Administered 2019-10-30: 10 mg via INTRAVENOUS
  Administered 2019-10-30: 50 mg via INTRAVENOUS

## 2019-10-30 MED ORDER — EPHEDRINE SULFATE 50 MG/ML IJ SOLN
INTRAMUSCULAR | Status: DC | PRN
  Administered 2019-10-30: 5 mg via INTRAVENOUS
  Administered 2019-10-30: 10 mg via INTRAVENOUS

## 2019-10-30 MED ORDER — SODIUM CHLORIDE 0.9 % IV SOLN: INTRAVENOUS | Status: DC

## 2019-10-30 MED ORDER — PROPOFOL 500 MG/50ML IV EMUL
INTRAVENOUS | Status: DC | PRN
  Administered 2019-10-30: 165 ug/kg/min via INTRAVENOUS

## 2019-10-30 NOTE — Anesthesia Preprocedure Evaluation (Signed)
Anesthesia Evaluation  Patient identified by MRN, date of birth, ID band Patient awake    Reviewed: Allergy & Precautions, H&P , NPO status , Patient's Chart, lab work & pertinent test results  History of Anesthesia Complications (+) PONV and history of anesthetic complications  Airway Mallampati: III  TM Distance: <3 FB Neck ROM: limited    Dental  (+) Chipped, Poor Dentition, Missing   Pulmonary neg shortness of breath, former smoker,           Cardiovascular Exercise Tolerance: Good + dysrhythmias      Neuro/Psych Seizures -,  negative psych ROS   GI/Hepatic Neg liver ROS, GERD  Medicated and Controlled,  Endo/Other  negative endocrine ROS  Renal/GU negative Renal ROS  negative genitourinary   Musculoskeletal  (+) Arthritis ,   Abdominal   Peds  Hematology negative hematology ROS (+)   Anesthesia Other Findings Past Medical History: No date: Arthritis     Comment:  hands,knees,shoulders 09/2013: Breast cancer, right breast ([facility])     Comment:  Right Breast, pT2,N0 (l+sn) No date: Dysrhythmia     Comment:  heart skipped beat per patient, checked by Dr Saralyn Pilar No date: GERD (gastroesophageal reflux disease) No date: Hyperlipidemia No date: Leukocytosis No date: Lymphocytosis     Comment:  low-grade clonal T-cell disorder No date: Malignant neoplasm of lower-inner quadrant of female breast  ([place])     Comment:  right, s/p chemotherapy No date: 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, 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) No date: Osteopenia No date: Osteoporosis No date: Personal history of chemotherapy No date: Polio     Comment:  Has been in a wheelchair last 10-12 years from weakness               and instability 1 time: PONV (postoperative nausea and vomiting)     Comment:  after hysterectomy No date: Reflux No date: Scoliosis x3: Seizures ([place])     Comment:  [facility] could find nothing wrong.Dec              2015 No date: Transfusion history  Past Surgical History: 08/10/83: ABDOMINAL HYSTERECTOMY     Comment:  ovaries were not removed No date: BACK SURGERY     Comment:  x 2 2008, 2013: BREAST BIOPSY; Right     Comment:  2008 negative, 2013 positive 04-24-14: BREAST BIOPSY; Left     Comment:  Calcifications associated with stroma and sclerosing               adenosis 10/2012: BREAST SURGERY; Right     Comment:  Mastectomy with s/n bx, and radiation therapy 02/20/2015: CATARACT EXTRACTION W/PHACO; Right     Comment:  Procedure: CATARACT EXTRACTION PHACO AND INTRAOCULAR               LENS PLACEMENT (IOC);  Surgeon: Leandrew Koyanagi, MD;               Location: [place];  Service: Ophthalmology;                Laterality: Right; 2011: COLONOSCOPY     Comment:  Dr. Candace Cruise, 2 benign polyps removed 08/12/2015: COLONOSCOPY WITH PROPOFOL; N/A     Comment:  Procedure: COLONOSCOPY WITH PROPOFOL;  Surgeon: Hulen Luster, MD;  Location: [facility] ENDOSCOPY;  Service:               Gastroenterology;  Laterality: N/A;  08/12/2015: ESOPHAGOGASTRODUODENOSCOPY (EGD) WITH PROPOFOL; N/A     Comment:  Procedure: ESOPHAGOGASTRODUODENOSCOPY (EGD) WITH               PROPOFOL;  Surgeon: Hulen Luster, MD;  Location: [facility]               ENDOSCOPY;  Service: Gastroenterology;  Laterality: N/A; 1988: FOOT SURGERY; Right 1957, 1958: LEG SURGERY; Bilateral No date: MASTECTOMY; Right 10/2012: PORTACATH PLACEMENT     Comment:  recently removed 1959, 1960 : SPINE SURGERY No date: TUBAL LIGATION  BMI    Body Mass Index: 23.44 kg/m      Reproductive/Obstetrics negative OB ROS                             Anesthesia Physical Anesthesia Plan  ASA: III  Anesthesia Plan: General   Post-op Pain Management:    Induction: Intravenous  PONV Risk Score and Plan: Propofol infusion and TIVA  Airway Management Planned: Natural Airway and Nasal Cannula  Additional  Equipment:   Intra-op Plan:   Post-operative Plan:   Informed Consent: I have reviewed the patients History and Physical, chart, labs and discussed the procedure including the risks, benefits and alternatives for the proposed anesthesia with the patient or authorized representative who has indicated his/her understanding and acceptance.     Dental Advisory Given  Plan Discussed with: Anesthesiologist, CRNA and Surgeon  Anesthesia Plan Comments: (Patient consented for risks of anesthesia including but not limited to:  - adverse reactions to medications - risk of intubation if required - damage to teeth, lips or other oral mucosa - sore throat or hoarseness - Damage to heart, brain, lungs or loss of life  Patient voiced understanding.)        Anesthesia Quick Evaluation

## 2019-10-30 NOTE — Op Note (Signed)
[facility] Gastroenterology Patient Name: Erika Miller Procedure Date: 10/30/2019 2:49 PM MRN: [id number] Account #: [id number] Date of Birth: 16-Sep-1946 Admit Type: Outpatient Age: 74 Room: [facility]hthal[facility] ENDO ROOM 1 Gender: Female Note Status: Finalized Procedure:             Colonoscopy Indications:           High risk colon cancer surveillance: Personal history                         of colonic polyps Providers:             Benay Pike. Alice Reichert MD, MD Referring MD:          Gayland Curry MD, MD (Referring MD) Medicines:             Propofol per Anesthesia Complications:         No immediate complications. Procedure:             Pre-Anesthesia Assessment:                        - The risks and benefits of the procedure and the                         sedation options and risks were discussed with the                         patient. All questions were answered and informed                         consent was obtained.                        - Patient identification and proposed procedure were                         verified prior to the procedure by the nurse. The                         procedure was verified in the procedure room.                        - ASA Grade Assessment: III - A patient with severe                         systemic disease.                        - After reviewing the risks and benefits, the patient                         was deemed in satisfactory condition to undergo the                         procedure.                        After obtaining informed consent, the colonoscope was                         passed under direct vision. Throughout the  procedure,                         the patient's blood pressure, pulse, and oxygen                         saturations were monitored continuously. The                         Colonoscope was introduced through the anus and                         advanced to the the cecum, identified by appendiceal                          orifice and ileocecal valve. The colonoscopy was                         technically difficult and complex due to a redundant                         colon and significant looping. Successful completion                         of the procedure was aided by applying abdominal                         pressure. The patient tolerated the procedure well.                         The quality of the bowel preparation was good. The                         entire colon was examined. Findings:      The perianal and digital rectal examinations were normal. Pertinent       negatives include normal sphincter tone and no palpable rectal lesions.      Many small and large-mouthed diverticula were found in the sigmoid colon.      Non-bleeding internal hemorrhoids were found during retroflexion. The       hemorrhoids were Grade I (internal hemorrhoids that do not prolapse).      The exam was otherwise without abnormality. Impression:            - Diverticulosis in the sigmoid colon.                        - Non-bleeding internal hemorrhoids.                        - The examination was otherwise normal.                        - No specimens collected. Recommendation:        - Await pathology results from EGD, also performed                         today.                        - Patient has a contact number  available for                         emergencies. The signs and symptoms of potential                         delayed complications were discussed with the patient.                         Return to normal activities tomorrow. Written                         discharge instructions were provided to the patient.                        - Resume previous diet.                        - Continue present medications.                        - No repeat colonoscopy due to current age (9 years                         or older) and the absence of advanced adenomas.                         - Return to GI office PRN.                        - Consider other non-GI causes of early satiety and                         weight loss.                        - The findings and recommendations were discussed with                         the patient. Procedure Code(s):     --- Professional ---                        [id number], Colorectal cancer screening; colonoscopy on                         individual at high risk Diagnosis Code(s):     --- Professional ---                        K57.30, Diverticulosis of large intestine without                         perforation or abscess without bleeding                        K64.0, First degree hemorrhoids                        Z86.010, Personal history of colonic polyps CPT copyright 2019 [employer]. All rights reserved. The codes documented in this report are preliminary and upon coder review may  be revised to meet current compliance requirements. Efrain Sella MD, MD 10/30/2019 3:27:33 PM This report has been signed electronically. Number of Addenda: 0 Note Initiated On: 10/30/2019 2:49 PM Scope Withdrawal Time: 0 hours 6 minutes 12 seconds  Total Procedure Duration: 0 hours 12 minutes 26 seconds  Estimated Blood Loss:  Estimated blood loss: none.      [facility]

## 2019-10-30 NOTE — Interval H&P Note (Signed)
History and Physical Interval Note:  10/30/2019 2:23 PM  Erika Miller  has presented today for surgery, with the diagnosis of WT LOSS HX POLYPS.  The various methods of treatment have been discussed with the patient and family. After consideration of risks, benefits and other options for treatment, the patient has consented to  Procedure(s): COLONOSCOPY WITH PROPOFOL (N/A) ESOPHAGOGASTRODUODENOSCOPY (EGD) WITH PROPOFOL (N/A) as a surgical intervention.  The patient's history has been reviewed, patient examined, no change in status, stable for surgery.  I have reviewed the patient's chart and labs.  Questions were answered to the patient's satisfaction.     Many, Stanton

## 2019-10-30 NOTE — H&P (Signed)
Outpatient short stay form Pre-procedure 10/30/2019 2:18 PM Sherisse Fullilove K. Alice Reichert, M.D.  Primary Physician: Gayland Curry, M.D.  Reason for visit:  Early satiety, unintentional weight loss, personal hx of colon polyps.  History of present illness:  74 y/o female patient presents for early satiety and unintentional weight loss. Has personal hx of colon polyps in the colon. Denies change in bowel habits, rectal bleeding or anemia. CT chest was negative for any noted etiology of her symptoms.     Current Facility-Administered Medications:  .  0.9 %  sodium chloride infusion, , Intravenous, Continuous, Castiel Lauricella, Benay Pike, MD  Medications Prior to Admission  Medication Sig Dispense Refill Last Dose  . aspirin EC 81 MG tablet Take by mouth.   Past Week at Unknown time  . Calcium Carbonate (CALCIUM 600 PO) Take 1 tablet by mouth daily. Dinner   Past Week at Unknown time  . cetirizine (ZYRTEC) 10 MG tablet Take 10 mg by mouth daily. AM   Past Week at Unknown time  . Cholecalciferol (VITAMIN D3) 2000 UNITS TABS Take 1 tablet by mouth daily. Dinner   Past Week at Unknown time  . famotidine (PEPCID) 20 MG tablet Take 20 mg by mouth daily. Am   Past Week at Unknown time  . gabapentin (NEURONTIN) 300 MG capsule Take 1 capsule (300 mg total) by mouth 3 (three) times daily. 90 capsule 3 10/29/2019 at Unknown time  . Multiple Vitamin (MULTIVITAMIN) tablet Take 1 tablet by mouth daily. Dinner   Past Week at Unknown time  . anastrozole (ARIMIDEX) 1 MG tablet Take 1 tablet (1 mg total) by mouth daily. Dinner time (Patient not taking: Reported on 04/03/2019) 90 tablet 3   . denosumab (PROLIA) 60 MG/ML SOSY injection Inject 60 mg into the skin.   Not Taking at Unknown time  . ibuprofen (ADVIL,MOTRIN) 200 MG tablet Take 200 mg by mouth every 6 (six) hours as needed.     . neomycin-polymyxin b-dexamethasone (MAXITROL) 3.5-10000-0.1 OINT Place 1 application into the left eye at bedtime.  0   . nicotine polacrilex  (NICORETTE) 2 MG gum Take 2 mg by mouth as needed for smoking cessation.     . prednisoLONE acetate (PRED FORTE) 1 % ophthalmic suspension Place 1 drop into the left eye daily.  0 Not Taking at Unknown time     Allergies  Allergen Reactions  . Augmentin [Amoxicillin-Pot Clavulanate] Nausea And Vomiting  . Macrobid [Nitrofurantoin Macrocrystal] Rash     Past Medical History:  Diagnosis Date  . Arthritis    hands,knees,shoulders  . Breast cancer, right breast ([place]) 09/2013   Right Breast, pT2,N0 (l+sn)  . Dysrhythmia    heart skipped beat per patient, checked by Dr Saralyn Pilar  . GERD (gastroesophageal reflux disease)   . Hyperlipidemia   . Leukocytosis   . Lymphocytosis    low-grade clonal T-cell disorder  . Malignant neoplasm of lower-inner quadrant of female breast [facility])    right, s/p chemotherapy  . 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 Osteopenia   . Osteoporosis   . Personal history of chemotherapy   . 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 PONV (postoperative nausea and vomiting) 1 time   after hysterectomy  . Reflux   . Scoliosis   . Seizures ([place]) x3   [facility] could find nothing wrong.Dec 2015  . Transfusion history     Review of systems:  Otherwise negative.    Physical Exam  Gen: Alert, oriented. Appears stated age.  HEENT: [place]/AT. PERRLA. Lungs: CTA, no wheezes. CV: RR nl S1, S2. Abd: soft, benign, no masses. BS+ Ext: No edema. Pulses 2+    Planned procedures: Proceed with EGD and colonoscopy. The patient understands the nature of the planned procedure, indications, risks, alternatives and potential complications including but not limited to bleeding, infection, perforation, damage to internal organs and possible oversedation/side effects from anesthesia. The patient agrees and gives consent to proceed.  Please refer to procedure notes for findings, recommendations and  patient disposition/instructions.     Deepak Bless K. Alice Reichert, M.D. Gastroenterology 10/30/2019  2:18 PM

## 2019-10-30 NOTE — Op Note (Signed)
[facility] Gastroenterology Patient Name: Erika Miller Procedure Date: 10/30/2019 2:50 PM MRN: [id number] Account #: [id number] Date of Birth: [birth year] Admit Type: Outpatient Age: 74 Room: [facility] ENDO ROOM 1 Gender: Female Note Status: Finalized Procedure:             Upper GI endoscopy Indications:           Early satiety, Weight loss Providers:             Benay Pike. Alice Reichert MD, MD Referring MD:          Gayland Curry MD, MD (Referring MD) Medicines:             Propofol per Anesthesia Complications:         No immediate complications. Procedure:             Pre-Anesthesia Assessment:                        - The risks and benefits of the procedure and the                         sedation options and risks were discussed with the                         patient. All questions were answered and informed                         consent was obtained.                        - Patient identification and proposed procedure were                         verified prior to the procedure by the nurse. The                         procedure was verified in the procedure room.                        - ASA Grade Assessment: III - A patient with severe                         systemic disease.                        - After reviewing the risks and benefits, the patient                         was deemed in satisfactory condition to undergo the                         procedure.                        After obtaining informed consent, the endoscope was                         passed under direct vision. Throughout the procedure,                         the patient's blood  pressure, pulse, and oxygen                         saturations were monitored continuously. The Endoscope                         was introduced through the mouth, and advanced to the                         third part of duodenum. The upper GI endoscopy was                         accomplished without  difficulty. The patient tolerated                         the procedure well. Findings:      Multiple plaques were found in the middle third of the esophagus, 30 cm       from the incisors. Cells for cytology were obtained by brushing.      One benign-appearing, intrinsic mild stenosis was found in the distal       esophagus. This stenosis measured 1.6 cm (inner diameter) x less than       one cm (in length). The stenosis was traversed.      Diffuse atrophic mucosa was found in the gastric fundus. Biopsies were       taken with a cold forceps for histology.      A 1 cm hiatal hernia was present.      The examined duodenum was normal. Impression:            - Multiple plaques in the middle third of the                         esophagus. Cells for cytology obtained.                        - Benign-appearing esophageal stenosis.                        - Gastric mucosal atrophy. Biopsied.                        - 1 cm hiatal hernia.                        - Normal examined duodenum. Recommendation:        - Await pathology results.                        - Proceed with colonoscopy Procedure Code(s):     --- Professional ---                        [phone], Esophagogastroduodenoscopy, flexible,                         transoral; with biopsy, single or multiple Diagnosis Code(s):     --- Professional ---                        R63.4, Abnormal weight loss  R68.81, Early satiety                        K44.9, Diaphragmatic hernia without obstruction or                         gangrene                        K31.89, Other diseases of stomach and duodenum                        K22.2, Esophageal obstruction                        K22.8, Other specified diseases of esophagus CPT copyright 2019 American Medical Association. All rights reserved. The codes documented in this report are preliminary and upon coder review may  be revised to meet current compliance  requirements. Efrain Sella MD, MD 10/30/2019 3:11:03 PM This report has been signed electronically. Number of Addenda: 0 Note Initiated On: 10/30/2019 2:50 PM Estimated Blood Loss:  Estimated blood loss: none.      [facility]

## 2019-10-30 NOTE — Transfer of Care (Signed)
Immediate Anesthesia Transfer of Care Note  Patient: Erika Miller  Procedure(s) Performed: COLONOSCOPY WITH PROPOFOL (N/A ) ESOPHAGOGASTRODUODENOSCOPY (EGD) WITH PROPOFOL (N/A )  Patient Location: PACU  Anesthesia Type:General  Level of Consciousness: awake, alert , oriented and patient cooperative  Airway & Oxygen Therapy: Patient Spontanous Breathing  Post-op Assessment: Report given to RN and Post -op Vital signs reviewed and stable  Post vital signs: Reviewed and stable  Last Vitals:  Vitals Value Taken Time  BP 99/80 10/30/19 1529  Temp 36.7 C 10/30/19 1528  Pulse 96 10/30/19 1534  Resp 22 10/30/19 1534  SpO2 100 % 10/30/19 1534  Vitals shown include unvalidated device data.  Last Pain:  Vitals:   10/30/19 1528  TempSrc: Temporal  PainSc: Asleep         Complications: No apparent anesthesia complications

## 2019-10-31 ENCOUNTER — Encounter: Payer: Self-pay | Admitting: *Deleted

## 2019-10-31 NOTE — Anesthesia Postprocedure Evaluation (Signed)
Anesthesia Post Note  Patient: Erika Miller  Procedure(s) Performed: COLONOSCOPY WITH PROPOFOL (N/A ) ESOPHAGOGASTRODUODENOSCOPY (EGD) WITH PROPOFOL (N/A )  Patient location during evaluation: Endoscopy Anesthesia Type: General Level of consciousness: awake and alert Pain management: pain level controlled Vital Signs Assessment: post-procedure vital signs reviewed and stable Respiratory status: spontaneous breathing, nonlabored ventilation, respiratory function stable and patient connected to nasal cannula oxygen Cardiovascular status: blood pressure returned to baseline and stable Postop Assessment: no apparent nausea or vomiting Anesthetic complications: no     Last Vitals:  Vitals:   10/30/19 1538 10/30/19 1548  BP: (!) 94/50 (!) 101/55  Pulse:    Resp:    Temp:    SpO2:      Last Pain:  Vitals:   10/30/19 1548  TempSrc:   PainSc: 0-No pain                 Precious Haws [name]

## 2019-11-01 LAB — SURGICAL PATHOLOGY

## 2019-11-02 LAB — CYTOLOGY - NON PAP

## 2019-11-15 ENCOUNTER — Inpatient Hospital Stay: Payer: Medicare HMO | Attending: Oncology | Admitting: Genetic Counselor

## 2019-11-15 ENCOUNTER — Inpatient Hospital Stay: Payer: Medicare HMO

## 2019-11-15 ENCOUNTER — Other Ambulatory Visit: Payer: Self-pay

## 2019-11-15 ENCOUNTER — Encounter: Payer: Self-pay | Admitting: Genetic Counselor

## 2019-11-15 DIAGNOSIS — C50811 Malignant neoplasm of overlapping sites of right female breast: Secondary | ICD-10-CM

## 2019-11-15 DIAGNOSIS — Z803 Family history of malignant neoplasm of breast: Secondary | ICD-10-CM

## 2019-11-15 DIAGNOSIS — Z170 Estrogen receptor positive status [ER+]: Secondary | ICD-10-CM | POA: Diagnosis not present

## 2019-11-15 DIAGNOSIS — Z853 Personal history of malignant neoplasm of breast: Secondary | ICD-10-CM

## 2019-11-15 DIAGNOSIS — Z8041 Family history of malignant neoplasm of ovary: Secondary | ICD-10-CM

## 2019-11-15 NOTE — Progress Notes (Signed)
REFERRING PROVIDER: Cammie Sickle, MD [place],  [place] [address]  PRIMARY PROVIDER:  Gayland Curry, MD  PRIMARY REASON FOR VISIT:  1. Malignant neoplasm of overlapping sites of right breast in female, estrogen receptor positive ([place])   2. Family history of breast cancer   3. Family history of ovarian cancer      HISTORY OF PRESENT ILLNESS:  I connected with  Erika Miller on 11/15/2019 at 10 AM EDT by MyChart video conference and verified that I am speaking with the correct person using two identifiers.   Patient location: [facility] Provider location: [facility]  Erika Miller, a 74 y.o. female, was seen for a  cancer genetics consultation at the request of Dr. Rogue Bussing due to a personal and family history of cancer.  Erika Miller presents to clinic today to discuss the possibility of a hereditary predisposition to cancer, genetic testing, and to further clarify her future cancer risks, as well as potential cancer risks for family members.   In 2013, at the age of 50, Erika Miller was diagnosed with Lobular carcinoma of the right breast. The treatment plan lumpectomy, chemotherapy and radiation.  Her sister reportedly has had genetic testing but she does not know the results.   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   # AUG 2012-? Mild-Leucocytosis/lymphocytosis [? 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   # Post Polio neuropathy/wheel chair   # FEB 2019- T-score of -2.3 [osteopenia] AUG 2019- START PROLIA q 53M ---------------------------------------------  DIAGNOSIS:  LOBULAR BREAST CA  STAGE:  II       ;GOALS: CURE  CURRENT/MOST RECENT THERAPY: Arimdex [sep 2019]    Malignant neoplasm of overlapping sites of right breast in female, estrogen receptor positive ([place])     RISK FACTORS:  Menarche was at age 36-10.  First live birth at age 52.  Ovaries intact: unsure.  Hysterectomy: yes.  Menopausal status: postmenopausal.  HRT use: 0 years. Colonoscopy: yes; 4-5 polyps. Mammogram within the last year: yes. Number of breast biopsies: 3. Up to date with pelvic exams: yes. Any excessive radiation exposure in the past: no  Past Medical History:  Diagnosis Date  . Arthritis    hands,knees,shoulders  . Breast cancer, right breast ([place]) 09/2013   Right Breast, pT2,N0 (l+sn)  . Dysrhythmia    heart skipped beat per patient, checked by Dr Saralyn Pilar  . Family history of breast cancer   . Family history of ovarian cancer   . GERD (gastroesophageal reflux disease)   . Hyperlipidemia   . Leukocytosis   . Lymphocytosis    low-grade clonal T-cell disorder  . 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 Osteopenia   . Osteoporosis   . Personal history of chemotherapy   . 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 PONV (postoperative nausea and vomiting) 1 time   after hysterectomy  . Reflux   . Scoliosis   . Seizures ([place]) x3   Dr Brigitte Pulse  [facility] could find nothing wrong.Dec 2015  . Transfusion history     Past Surgical History:  Procedure Laterality Date  . ABDOMINAL HYSTERECTOMY  08/10/83   ovaries were not removed  . BACK SURGERY     x 2  . BREAST BIOPSY Right 2008, 2013   2008 negative, 2013 positive  . BREAST BIOPSY Left 04-24-14   Calcifications associated with stroma and sclerosing adenosis  . BREAST SURGERY Right 10/2012   Mastectomy with s/n bx, and radiation therapy  .  CATARACT EXTRACTION W/PHACO Right 02/20/2015   Procedure: CATARACT EXTRACTION PHACO AND INTRAOCULAR LENS PLACEMENT (IOC);  Surgeon: Leandrew Koyanagi, MD;  Location: [facility];  Service: Ophthalmology;  Laterality: Right;  . COLONOSCOPY  2011   Dr. Candace Cruise, 2 benign polyps removed  . COLONOSCOPY WITH PROPOFOL N/A 08/12/2015   Procedure: COLONOSCOPY WITH PROPOFOL;  Surgeon: Hulen Luster, MD;  Location: [facility] ENDOSCOPY;  Service: Gastroenterology;  Laterality: N/A;  . COLONOSCOPY WITH PROPOFOL N/A 10/30/2019   Procedure: COLONOSCOPY WITH PROPOFOL;  Surgeon: Toledo, Benay Pike, MD;  Location: [facility] ENDOSCOPY;  Service: Gastroenterology;  Laterality: N/A;  . ESOPHAGOGASTRODUODENOSCOPY (EGD) WITH PROPOFOL N/A 08/12/2015   Procedure: ESOPHAGOGASTRODUODENOSCOPY (EGD) WITH PROPOFOL;  Surgeon: Hulen Luster, MD;  Location: [facility] ENDOSCOPY;  Service: Gastroenterology;  Laterality: N/A;  . ESOPHAGOGASTRODUODENOSCOPY (EGD) WITH PROPOFOL N/A 10/30/2019   Procedure: ESOPHAGOGASTRODUODENOSCOPY (EGD) WITH PROPOFOL;  Surgeon: Toledo, Benay Pike, MD;  Location: [facility] ENDOSCOPY;  Service: Gastroenterology;  Laterality: N/A;  . FOOT SURGERY Right 1988  . LEG SURGERY Bilateral 1957, 1958  . MASTECTOMY Right   . PORTACATH PLACEMENT  10/2012   recently removed  . [place]   . TUBAL LIGATION      Social History   Socioeconomic History  . Marital status: Married    Spouse name: Not on file  . Number of children: Not on file  . Years of education: Not on file  . Highest education level: Not on file  Occupational History  . Not on file  Tobacco Use  . Smoking status: Former Smoker    Packs/day: 1.00    Years: 35.00    Pack years: 35.00    Types: Cigarettes    Quit date: 10/20/2003    Years since quitting: 16.0  . Smokeless tobacco: Never Used  . Tobacco comment: Smoking History stopped smoking in 2005. smoked 1 pk/per day x 32 yrs  Substance and Sexual Activity  . Alcohol use: No    Alcohol/week:  0.0 standard drinks  . Drug use: No  . Sexual activity: Not on file  Other Topics Concern  . Not on file  Social History Narrative  . Not on file   Social Determinants of Health   Financial Resource Strain:   . Difficulty of Paying Living Expenses: Not on file  Food Insecurity:   . Worried About Charity fundraiser in the Last Year: Not on file  . Ran Out of Food in the Last Year: Not on file  Transportation Needs:   . Lack of Transportation (Medical): Not on file  . Lack of Transportation (Non-Medical): Not on file  Physical Activity:   . Days of Exercise per Week: Not on file  . Minutes of Exercise per Session: Not on file  Stress:   . Feeling of Stress : Not on file  Social Connections:   . Frequency of Communication with Friends and Family: Not on file  . Frequency of Social Gatherings with  Friends and Family: Not on file  . Attends Religious Services: Not on file  . Active Member of Clubs or Organizations: Not on file  . Attends Archivist Meetings: Not on file  . Marital Status: Not on file     FAMILY HISTORY:  We obtained a detailed, 4-generation family history.  Significant diagnoses are listed below: Family History  Problem Relation Age of Onset  . Breast cancer Sister 65  . Ovarian cancer Sister   . Lung cancer Other   . Dementia Mother   . Lung cancer Father   . Dementia Maternal Uncle   . Diabetes Paternal Uncle   . Cancer Maternal Grandmother        "female cancer"  . Heart attack Maternal Uncle     The patient has a son and daughter who are cancer free. She has three brothers and two sisters.  One sister was diagnosed with breast cancer at 74 and ovarian cancer at 59.  Both parents are deceased.  The patient's mother died form complication of dementia.  She had two brothers, one who had dementia and one who had a heart attack.  Her parents are deceased. Her mother had a 'female cancer' for which she received cobalt treatments.  The patient's  father had 54 brothers and a sister who reportedly did not have cancer. The paternal grandparents also were not reported to have cancer.   Erika Miller is unaware of previous family history of genetic testing for hereditary cancer risks. Patient's maternal ancestors are of Caucasian descent, and paternal ancestors are of Caucasian descent. There is no reported Ashkenazi Jewish ancestry. There is no known consanguinity.    GENETIC COUNSELING ASSESSMENT: Erika Miller is a 74 y.o. female with a personal and family history of cancer which is somewhat suggestive of a hereditary cancer syndrome and predisposition to cancer given the combination of breast and ovarian cancer in the family. We, therefore, discussed and recommended the following at today's visit.   DISCUSSION: We discussed that 5 - 10% of breast cancer is hereditary, with most cases associated with BRCA mutations.  There are other genes that can be associated with hereditary breast cancer syndromes.  Reportedly her sister had genetic testing at [facility] when she was treated for Ovarian cancer.  However, Erika Miller does not know the results.  We discussed that testing is beneficial for several reasons including knowing how to follow individuals after completing their treatment, identifying whether potential treatment options such as PARP inhibitors would be beneficial, and understand if other family members could be at risk for cancer and allow them to undergo genetic testing.   We reviewed the characteristics, features and inheritance patterns of hereditary cancer syndromes. We also discussed genetic testing, including the appropriate family members to test, the process of testing, insurance coverage and turn-around-time for results. We discussed the implications of a negative, positive, carrier and/or variant of uncertain significant result. We recommended Erika Miller pursue genetic testing for the common hereditary cancer gene panel. The Common Hereditary  Gene Panel offered by [facility] includes sequencing and/or deletion duplication testing of the following 48 genes: APC, ATM, AXIN2, BARD1, BMPR1A, BRCA1, BRCA2, BRIP1, CDH1, CDK4, CDKN2A (p14ARF), CDKN2A (p16INK4a), CHEK2, CTNNA1, DICER1, EPCAM (Deletion/duplication testing only), GREM1 (promoter region deletion/duplication testing only), KIT, MEN1, MLH1, MSH2, MSH3, MSH6, MUTYH, NBN, NF1, NHTL1, PALB2, PDGFRA, PMS2, POLD1, POLE, PTEN, RAD50, RAD51C, RAD51D, RNF43, SDHB, SDHC, SDHD, SMAD4, SMARCA4. STK11, TP53, TSC1, TSC2, and VHL.  The following genes were evaluated  for sequence changes only: SDHA and HOXB13 c.251G>A variant only.   Based on Erika Miller's personal and family history of cancer, she meets medical criteria for genetic testing. Despite that she meets criteria, she may still have an out of pocket cost. We discussed that if her out of pocket cost for testing is over $100, the laboratory will call and confirm whether she wants to proceed with testing.  If the out of pocket cost of testing is less than $100 she will be billed by the genetic testing laboratory.   PLAN: After considering the risks, benefits, and limitations, Erika Miller provided informed consent to pursue genetic testing and the blood sample was sent to [facility] for analysis of the common hereditary cancer panel. Results should be available within approximately 2-3 weeks' time, at which point they will be disclosed by telephone to Erika Miller, as will any additional recommendations warranted by these results. Erika Miller will receive a summary of her genetic counseling visit and a copy of her results once available. This information will also be available in Epic.   Lastly, we encouraged Erika Miller to remain in contact with cancer genetics annually so that we can continuously update the family history and inform her of any changes in cancer genetics and testing that may be of benefit for this family.   Erika Miller questions  were answered to her satisfaction today. Our contact information was provided should additional questions or concerns arise. Thank you for the referral and allowing [place] to share in the care of your patient.   Sheralee Qazi P. Florene Glen, [place], [facility] Licensed, Insurance risk surveyor [email or web address] phone: [phone]  The patient was seen for a total of 54 minutes in face-to-face genetic counseling.  This patient was discussed with Drs. Magrinat, Lindi Adie and/or Burr Medico who agrees with the above.    _______________________________________________________________________ For Office Staff:  Number of people involved in session: 1 Was an Intern/ student involved with case: no

## 2019-11-24 ENCOUNTER — Telehealth: Payer: Self-pay | Admitting: Genetic Counselor

## 2019-11-24 ENCOUNTER — Encounter: Payer: Self-pay | Admitting: Genetic Counselor

## 2019-11-24 DIAGNOSIS — Z1379 Encounter for other screening for genetic and chromosomal anomalies: Secondary | ICD-10-CM | POA: Insufficient documentation

## 2019-11-24 NOTE — Telephone Encounter (Signed)
LM on VM that results are back and to please call.  Left CB instructions. 

## 2019-11-28 ENCOUNTER — Ambulatory Visit: Payer: Self-pay | Admitting: Genetic Counselor

## 2019-11-28 DIAGNOSIS — Z1379 Encounter for other screening for genetic and chromosomal anomalies: Secondary | ICD-10-CM

## 2019-11-28 NOTE — Progress Notes (Signed)
HPI:  Erika Miller was previously seen in the [place] clinic due to a personal and family history of cancer and concerns regarding a hereditary predisposition to cancer. Please refer to our prior cancer genetics clinic note for more information regarding our discussion, assessment and recommendations, at the time. Erika Miller recent genetic test results were disclosed to her, as were recommendations warranted by these results. These results and recommendations are discussed in more detail below.  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   # AUG 2012-? Mild-Leucocytosis/lymphocytosis [? 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   # Post Polio neuropathy/wheel chair   # FEB 2019- T-score of -2.3 [osteopenia] AUG 2019- START PROLIA q 73M ---------------------------------------------  DIAGNOSIS: LOBULAR BREAST CA  STAGE:  II       ;GOALS: CURE  CURRENT/MOST RECENT THERAPY: Arimdex [sep 0263]    Malignant neoplasm of overlapping sites of right breast in female, estrogen receptor positive ([place])  11/24/2019 Genetic Testing   BARD1 c.166A>G VUS identified on the common hereditary cancer panel.  The Common Hereditary Gene Panel offered by [facility] includes sequencing and/or deletion duplication testing of the following 48 genes: APC, ATM, AXIN2, BARD1, BMPR1A, BRCA1, BRCA2, BRIP1, CDH1, CDK4, CDKN2A (p14ARF), CDKN2A (p16INK4a), CHEK2, CTNNA1, DICER1, EPCAM (Deletion/duplication testing only), GREM1 (promoter region deletion/duplication testing only), KIT, MEN1, MLH1, MSH2, MSH3, MSH6, MUTYH, NBN, NF1, NHTL1, PALB2, PDGFRA, PMS2,  POLD1, POLE, PTEN, RAD50, RAD51C, RAD51D, RNF43, SDHB, SDHC, SDHD, SMAD4, SMARCA4. STK11, TP53, TSC1, TSC2, and VHL.  The following genes were evaluated for sequence changes only: SDHA and HOXB13 c.251G>A variant only. The report date is November 24, 2019.     FAMILY HISTORY:  We obtained a detailed, 4-generation family history.  Significant diagnoses are listed below: Family History  Problem Relation Age of Onset  . Breast cancer Sister 48  . Ovarian cancer Sister   . Lung cancer Other   . Dementia Mother   . Lung cancer Father   . Dementia Maternal Uncle   . Diabetes Paternal Uncle   . Cancer Maternal Grandmother        "female cancer"  . Heart attack Maternal Uncle     The patient has a son and daughter who are cancer free. She has three brothers and two sisters.  One sister was diagnosed with breast cancer at 8 and ovarian cancer at 38.  Both parents are deceased.  The patient's mother died form complication of dementia.  She had two brothers, one who had dementia and one who had a heart attack.  Her parents are deceased. Her mother had a 'female cancer' for which she received cobalt treatments.  The patient's father had 59 brothers and a sister who reportedly did not have cancer. The paternal grandparents also were not reported to have cancer.   Erika Miller is unaware of previous family history of genetic testing for hereditary cancer risks. Patient's maternal ancestors are of Caucasian descent, and paternal ancestors are of Caucasian descent. There is no reported Ashkenazi Jewish ancestry. There is no known consanguinity.     GENETIC TEST RESULTS: Genetic testing reported out on November 24, 2019 through the common hereditary  cancer panel found no pathogenic mutations. The Common Hereditary Gene Panel offered by [facility] includes sequencing and/or deletion duplication testing of the following 48 genes: APC, ATM, AXIN2, BARD1, BMPR1A, BRCA1, BRCA2, BRIP1, CDH1, CDK4, CDKN2A  (p14ARF), CDKN2A (p16INK4a), CHEK2, CTNNA1, DICER1, EPCAM (Deletion/duplication testing only), GREM1 (promoter region deletion/duplication testing only), KIT, MEN1, MLH1, MSH2, MSH3, MSH6, MUTYH, NBN, NF1, NHTL1, PALB2, PDGFRA, PMS2, POLD1, POLE, PTEN, RAD50, RAD51C, RAD51D, RNF43, SDHB, SDHC, SDHD, SMAD4, SMARCA4. STK11, TP53, TSC1, TSC2, and VHL.  The following genes were evaluated for sequence changes only: SDHA and HOXB13 c.251G>A variant only. The test report has been scanned into EPIC and is located under the Molecular Pathology section of the Results Review tab.  A portion of the result report is included below for reference.                                                           We discussed with Erika Miller that because current genetic testing is not perfect, it is possible there may be a gene mutation in one of these genes that current testing cannot detect, but that chance is small.  We also discussed, that there could be another gene that has not yet been discovered, or that we have not yet tested, that is responsible for the cancer diagnoses in the family. It is also possible there is a hereditary cause for the cancer in the family that Erika Miller did not inherit and therefore was not identified in her testing.  Therefore, it is important to remain in touch with cancer genetics in the future so that we can continue to offer Erika Miller the most up to date genetic testing.   Genetic testing did identify a variant of uncertain significance (VUS) was identified in the BARD1 gene called c.166A>G (p.Ile56Val).  At this time, it is unknown if this variant is associated with increased cancer risk or if this is a normal finding, but most variants such as this get reclassified to being inconsequential. It should not be used to make medical management decisions. With time, we suspect the lab will determine the significance of this variant, if any. If we do learn more about it, we will try to contact [name] to discuss it further. However, it is important to stay in touch with [place] periodically and keep the address and phone number up to date.  ADDITIONAL GENETIC TESTING: We discussed with Erika Miller that there are other genes that are associated with increased cancer risk that can be analyzed. Should Erika Miller wish to pursue additional genetic testing, we are happy to discuss and coordinate this testing, at any time.    CANCER SCREENING RECOMMENDATIONS: Erika Miller test result is considered negative (normal).  This means that we have not identified a hereditary cause for her personal and family history of cancer at this time. Most cancers happen by chance and this negative test suggests that her cancer may fall into this category.    While reassuring, this does not definitively rule out a hereditary predisposition to cancer. It is still possible that there could be genetic mutations that are undetectable by current technology. There could be genetic mutations in genes that have not been tested or identified to increase cancer risk.  Therefore, it is recommended she continue to follow  the cancer management and screening guidelines provided by her oncology and primary healthcare provider.   An individual's cancer risk and medical management are not determined by genetic test results alone. Overall cancer risk assessment incorporates additional factors, including personal medical history, family history, and any available genetic information that may result in a personalized plan for cancer prevention and surveillance  RECOMMENDATIONS FOR FAMILY MEMBERS:  Individuals in this family might be at some increased risk of developing cancer, over the general population risk, simply due to the family history of cancer.  We recommended women in this family have a yearly mammogram beginning at age 5, or 93 years younger than the earliest onset of cancer, an annual clinical breast exam, and perform monthly breast  self-exams. Women in this family should also have a gynecological exam as recommended by their primary provider. All family members should have a colonoscopy by age 59.  It is also possible there is a hereditary cause for the cancer in Ms. Sieg's family that she did not inherit and therefore was not identified in her.  Based on Erika Miller family history, we recommended her sister, who was diagnosed with both breast and ovarian cancer, have genetic counseling and testing. Erika Miller stated that this sister has had genetic testing but that she does not know what the results are.  I offered to review the results once she has access to them to compare her sister's results to her results.   FOLLOW-UP: Lastly, we discussed with Erika Miller that cancer genetics is a rapidly advancing field and it is possible that new genetic tests will be appropriate for her and/or her family members in the future. We encouraged her to remain in contact with cancer genetics on an annual basis so we can update her personal and family histories and let her know of advances in cancer genetics that may benefit this family.   Our contact number was provided. Erika Miller questions were answered to her satisfaction, and she knows she is welcome to call us at anytime with additional questions or concerns.   Roma Kayser, [place], [facility] Licensed, Certified Genetic Counselor [email or web address]

## 2019-11-28 NOTE — Telephone Encounter (Signed)
Revealed negative genetic testing.  Discussed that we do not know why she has breast cancer or why there is cancer in the family. It could be due to a different gene that we are not testing, or maybe our current technology may not be able to pick something up.  It will be important for her to keep in contact with genetics to keep up with whether additional testing may be needed. 

## 2019-12-12 ENCOUNTER — Inpatient Hospital Stay: Payer: Medicare HMO | Attending: Internal Medicine | Admitting: Internal Medicine

## 2019-12-12 ENCOUNTER — Other Ambulatory Visit: Payer: Self-pay

## 2019-12-12 ENCOUNTER — Inpatient Hospital Stay: Payer: Medicare HMO

## 2019-12-12 VITALS — BP 122/59 | HR 75 | Temp 97.1°F | Resp 20

## 2019-12-12 DIAGNOSIS — E785 Hyperlipidemia, unspecified: Secondary | ICD-10-CM | POA: Insufficient documentation

## 2019-12-12 DIAGNOSIS — Z923 Personal history of irradiation: Secondary | ICD-10-CM | POA: Diagnosis not present

## 2019-12-12 DIAGNOSIS — Z853 Personal history of malignant neoplasm of breast: Secondary | ICD-10-CM | POA: Insufficient documentation

## 2019-12-12 DIAGNOSIS — Z9221 Personal history of antineoplastic chemotherapy: Secondary | ICD-10-CM | POA: Insufficient documentation

## 2019-12-12 DIAGNOSIS — Z7982 Long term (current) use of aspirin: Secondary | ICD-10-CM | POA: Insufficient documentation

## 2019-12-12 DIAGNOSIS — Z79899 Other long term (current) drug therapy: Secondary | ICD-10-CM | POA: Insufficient documentation

## 2019-12-12 DIAGNOSIS — M1990 Unspecified osteoarthritis, unspecified site: Secondary | ICD-10-CM | POA: Insufficient documentation

## 2019-12-12 DIAGNOSIS — R634 Abnormal weight loss: Secondary | ICD-10-CM | POA: Diagnosis not present

## 2019-12-12 DIAGNOSIS — Z8612 Personal history of poliomyelitis: Secondary | ICD-10-CM | POA: Diagnosis not present

## 2019-12-12 DIAGNOSIS — K219 Gastro-esophageal reflux disease without esophagitis: Secondary | ICD-10-CM | POA: Diagnosis not present

## 2019-12-12 DIAGNOSIS — D72820 Lymphocytosis (symptomatic): Secondary | ICD-10-CM | POA: Insufficient documentation

## 2019-12-12 DIAGNOSIS — R42 Dizziness and giddiness: Secondary | ICD-10-CM | POA: Insufficient documentation

## 2019-12-12 DIAGNOSIS — M8580 Other specified disorders of bone density and structure, unspecified site: Secondary | ICD-10-CM | POA: Insufficient documentation

## 2019-12-12 DIAGNOSIS — C50811 Malignant neoplasm of overlapping sites of right female breast: Secondary | ICD-10-CM | POA: Diagnosis not present

## 2019-12-12 DIAGNOSIS — Z170 Estrogen receptor positive status [ER+]: Secondary | ICD-10-CM | POA: Diagnosis not present

## 2019-12-12 LAB — CBC WITH DIFFERENTIAL/PLATELET
Abs Immature Granulocytes: 0.02 10*3/uL (ref 0.00–0.07)
Basophils Absolute: 0.1 10*3/uL (ref 0.0–0.1)
Basophils Relative: 1 %
Eosinophils Absolute: 0.2 10*3/uL (ref 0.0–0.5)
Eosinophils Relative: 2 %
HCT: 35.2 % — ABNORMAL LOW (ref 36.0–46.0)
Hemoglobin: 11.1 g/dL — ABNORMAL LOW (ref 12.0–15.0)
Immature Granulocytes: 0 %
Lymphocytes Relative: 53 %
Lymphs Abs: 4.7 10*3/uL — ABNORMAL HIGH (ref 0.7–4.0)
MCH: 30.5 pg (ref 26.0–34.0)
MCHC: 31.5 g/dL (ref 30.0–36.0)
MCV: 96.7 fL (ref 80.0–100.0)
Monocytes Absolute: 0.6 10*3/uL (ref 0.1–1.0)
Monocytes Relative: 6 %
Neutro Abs: 3.4 10*3/uL (ref 1.7–7.7)
Neutrophils Relative %: 38 %
Platelets: 242 10*3/uL (ref 150–400)
RBC: 3.64 MIL/uL — ABNORMAL LOW (ref 3.87–5.11)
RDW: 13.5 % (ref 11.5–15.5)
WBC: 9 10*3/uL (ref 4.0–10.5)
nRBC: 0 % (ref 0.0–0.2)

## 2019-12-12 LAB — COMPREHENSIVE METABOLIC PANEL
ALT: 11 U/L (ref 0–44)
AST: 22 U/L (ref 15–41)
Albumin: 3.3 g/dL — ABNORMAL LOW (ref 3.5–5.0)
Alkaline Phosphatase: 51 U/L (ref 38–126)
Anion gap: 8 (ref 5–15)
BUN: 10 mg/dL (ref 8–23)
CO2: 27 mmol/L (ref 22–32)
Calcium: 8.6 mg/dL — ABNORMAL LOW (ref 8.9–10.3)
Chloride: 103 mmol/L (ref 98–111)
Creatinine, Ser: 0.56 mg/dL (ref 0.44–1.00)
GFR calc Af Amer: >60 mL/min (ref >60)
GFR calc non Af Amer: >60 mL/min (ref >60)
Glucose, Bld: 87 mg/dL (ref 70–99)
Potassium: 3.7 mmol/L (ref 3.5–5.1)
Sodium: 138 mmol/L (ref 135–145)
Total Bilirubin: 0.5 mg/dL (ref 0.3–1.2)
Total Protein: 6.9 g/dL (ref 6.5–8.1)

## 2019-12-12 LAB — LACTATE DEHYDROGENASE: LDH: 127 U/L (ref 98–192)

## 2019-12-12 NOTE — Assessment & Plan Note (Addendum)
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  Clinically stable.  # STAGE II  RIGHT BREAST CA/Lobular cancer-[s/p adjuvant Arimidex-06/2018]; WNL;   Stable.   # weight loss- 30 pounds/ in last 6 months; ? Etiology-overall stable.  # BMD- osteopenia- jan 2019-Mild hypocal 8.5;  continue calcium plus vitamin D twice a day.; will re-start in 6 months  # Dizzy spells-unclear etiology; question ENT/vertigo.  Defer to PCP for further recommendations  # DISPO: # follow up in 6 months- MD; labs- cbc/cmp/Prolia- Dr.B

## 2019-12-12 NOTE — Progress Notes (Signed)
[facility] OFFICE PROGRESS NOTE  Patient Care Team: Gayland Curry, MD as PCP - General (Family Medicine) Bary Castilla, Forest Gleason, MD (General Surgery) Gayland Curry, MD as Referring Physician [facility])  Cancer Staging No matching staging information was found for the patient.   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   # AUG 2012-? Mild-Leucocytosis/lymphocytosis [? 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   # Post Polio neuropathy/wheel chair   # FEB 2019- T-score of -2.3 [osteopenia] AUG 2019- START PROLIA q 18M ---------------------------------------------  DIAGNOSIS: LOBULAR BREAST CA  STAGE:  II       ;GOALS: CURE  CURRENT/MOST RECENT THERAPY: Arimdex [sep 1610]    Malignant neoplasm of overlapping sites of right breast in female, estrogen receptor positive ([place])  11/24/2019 Genetic Testing   BARD1 c.166A>G VUS identified on the common hereditary cancer panel.  The Common Hereditary Gene Panel offered by [facility] includes sequencing and/or deletion duplication testing of the following 48 genes: APC, ATM, AXIN2, BARD1, BMPR1A, BRCA1, BRCA2, BRIP1, CDH1, CDK4, CDKN2A (p14ARF), CDKN2A (p16INK4a), CHEK2, CTNNA1, DICER1, EPCAM (Deletion/duplication testing only), GREM1 (promoter region deletion/duplication testing only), KIT, MEN1, MLH1, MSH2, MSH3, MSH6, MUTYH, NBN, NF1, NHTL1, PALB2, PDGFRA, PMS2, POLD1, POLE, PTEN, RAD50, RAD51C, RAD51D, RNF43, SDHB, SDHC, SDHD, SMAD4, SMARCA4. STK11, TP53, TSC1, TSC2, and VHL.  The following genes were evaluated for sequence changes only: SDHA and HOXB13 c.251G>A variant only. The  report date is November 24, 2019.       INTERVAL HISTORY:  Jenette Rayson Edelen 74 y.o.  female pleasant patient above history of stage II lobular breast cancer currently on on surveillance; mild lymphocytosis/recent weight loss is here for follow-up.  Patient had imaging chest and pelvis-negative for any acute process especially for weight loss.  Patient's appetite is fair.  Currently weight is stable.  No new lumps or bumps.  Complains of chronic fatigue.   Review of Systems  Constitutional: Positive for malaise/fatigue and weight loss. Negative for chills, diaphoresis and fever.  HENT: Negative for nosebleeds and sore throat.   Eyes: Negative for double vision.  Respiratory: Negative for cough, hemoptysis, sputum production, shortness of breath and wheezing.   Cardiovascular: Negative for chest pain, palpitations, orthopnea and leg swelling.  Gastrointestinal: Negative for abdominal pain, blood in stool, constipation, diarrhea, heartburn, melena, nausea and vomiting.  Genitourinary: Negative for dysuria, frequency and urgency.  Musculoskeletal: Negative for back pain and joint pain.  Skin: Negative.  Negative for itching and rash.  Neurological: Negative for dizziness, tingling, weakness and headaches. 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  Endo/Heme/Allergies: Does not bruise/bleed easily.  Psychiatric/Behavioral: Negative for depression. The patient is not nervous/anxious and does not have insomnia.      PAST MEDICAL HISTORY :  Past Medical History:  Diagnosis Date  . Arthritis    hands,knees,shoulders  . Breast cancer, right breast ([place]) 09/2013   Right Breast, pT2,N0 (l+sn)  . Dysrhythmia    heart skipped beat per patient, checked by Dr Saralyn Pilar  . Family history of breast cancer   . Family history of ovarian cancer   . GERD (gastroesophageal reflux disease)   . Hyperlipidemia   .  Leukocytosis   . Lymphocytosis    low-grade clonal T-cell  disorder  . Malignant neoplasm of lower-inner quadrant of female breast [facility])    right, s/p chemotherapy  . 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 Osteopenia   . Osteoporosis   . Personal history of chemotherapy   . 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 PONV (postoperative nausea and vomiting) 1 time   after hysterectomy  . Reflux   . Scoliosis   . Seizures ([place]) x3   Dr [facility] could find nothing wrong.Dec 2015  . Transfusion history     PAST SURGICAL HISTORY :   Past Surgical History:  Procedure Laterality Date  . ABDOMINAL HYSTERECTOMY  08/10/83   ovaries were not removed  . BACK SURGERY     x 2  . BREAST BIOPSY Right 2008, 2013   2008 negative, 2013 positive  . BREAST BIOPSY Left 04-24-14   Calcifications associated with stroma and sclerosing adenosis  . BREAST SURGERY Right 10/2012   Mastectomy with s/n bx, and radiation therapy  . CATARACT EXTRACTION W/PHACO Right 02/20/2015   Procedure: CATARACT EXTRACTION PHACO AND INTRAOCULAR LENS PLACEMENT (IOC);  Surgeon: Leandrew Koyanagi, MD;  Location: [facility];  Service: Ophthalmology;  Laterality: Right;  . COLONOSCOPY  2011   Dr. Candace Cruise, 2 benign polyps removed  . COLONOSCOPY WITH PROPOFOL N/A 08/12/2015   Procedure: COLONOSCOPY WITH PROPOFOL;  Surgeon: Hulen Luster, MD;  Location: [facility] ENDOSCOPY;  Service: Gastroenterology;  Laterality: N/A;  . COLONOSCOPY WITH PROPOFOL N/A 10/30/2019   Procedure: COLONOSCOPY WITH PROPOFOL;  Surgeon: Toledo, Benay Pike, MD;  Location: [facility] ENDOSCOPY;  Service: Gastroenterology;  Laterality: N/A;  . ESOPHAGOGASTRODUODENOSCOPY (EGD) WITH PROPOFOL N/A 08/12/2015   Procedure: ESOPHAGOGASTRODUODENOSCOPY (EGD) WITH PROPOFOL;  Surgeon: Hulen Luster, MD;  Location: [facility] ENDOSCOPY;  Service: Gastroenterology;  Laterality: N/A;  . ESOPHAGOGASTRODUODENOSCOPY (EGD) WITH PROPOFOL N/A 10/30/2019   Procedure:  ESOPHAGOGASTRODUODENOSCOPY (EGD) WITH PROPOFOL;  Surgeon: Toledo, Benay Pike, MD;  Location: [facility] ENDOSCOPY;  Service: Gastroenterology;  Laterality: N/A;  . FOOT SURGERY Right 1988  . LEG SURGERY Bilateral 1957, 1958  . MASTECTOMY Right   . PORTACATH PLACEMENT  10/2012   recently removed  . [place]   . TUBAL LIGATION      FAMILY HISTORY :   Family History  Problem Relation Age of Onset  . Breast cancer Sister 40  . Ovarian cancer Sister   . Lung cancer Other   . Dementia Mother   . Lung cancer Father   . Dementia Maternal Uncle   . Diabetes Paternal Uncle   . Cancer Maternal Grandmother        "female cancer"  . Heart attack Maternal Uncle     SOCIAL HISTORY:   Social History   Tobacco Use  . Smoking status: Former Smoker    Packs/day: 1.00    Years: 35.00    Pack years: 35.00    Types: Cigarettes    Quit date: 10/20/2003    Years since quitting: 16.1  . Smokeless tobacco: Never Used  . Tobacco comment: Smoking History stopped smoking in 2005. smoked 1 pk/per day x 32 yrs  Substance Use Topics  . Alcohol use: No    Alcohol/week: 0.0 standard drinks  . Drug use: No    ALLERGIES:  is allergic to augmentin [amoxicillin-pot clavulanate] and macrobid [nitrofurantoin macrocrystal].  MEDICATIONS:  Current Outpatient  Medications  Medication Sig Dispense Refill  . aspirin EC 81 MG tablet Take by mouth.    . Calcium Carbonate (CALCIUM 600 PO) Take 1 tablet by mouth daily. Dinner    . cetirizine (ZYRTEC) 10 MG tablet Take 10 mg by mouth daily. AM    . Cholecalciferol (VITAMIN D3) 2000 UNITS TABS Take 1 tablet by mouth daily. Dinner    . famotidine (PEPCID) 20 MG tablet Take 20 mg by mouth daily. Am    . gabapentin (NEURONTIN) 300 MG capsule Take 1 capsule (300 mg total) by mouth 3 (three) times daily. 90 capsule 3  . Multiple Vitamin (MULTIVITAMIN) tablet Take 1 tablet by mouth daily. Dinner    . nicotine polacrilex (NICORETTE) 2 MG gum Take 2 mg by mouth  as needed for smoking cessation.    Marland Kitchen ibuprofen (ADVIL,MOTRIN) 200 MG tablet Take 200 mg by mouth every 6 (six) hours as needed.    . neomycin-polymyxin b-dexamethasone (MAXITROL) 3.5-10000-0.1 OINT Place 1 application into the left eye at bedtime.  0  . prednisoLONE acetate (PRED FORTE) 1 % ophthalmic suspension Place 1 drop into the left eye daily.  0   No current facility-administered medications for this visit.    PHYSICAL EXAMINATION: ECOG PERFORMANCE STATUS: 2 - Symptomatic, <50% confined to bed  BP (!) 122/59   Pulse 75   Temp (!) 97.1 F (36.2 C) (Tympanic)   Resp 20   There were no vitals filed for this visit.  Physical Exam  Constitutional: She is oriented to person, place, and time.  In a wheelchair.  Chronic/polio.  Alone.   Thin built; frail appearing.  HENT:  Head: Normocephalic and atraumatic.  Mouth/Throat: Oropharynx is clear and moist. No oropharyngeal exudate.  Eyes: Pupils are equal, round, and reactive to light.  Cardiovascular: Normal rate and regular rhythm.  Pulmonary/Chest: Effort normal and breath sounds normal. No respiratory distress. She has no wheezes.  Abdominal: Soft. Bowel sounds are normal. She exhibits no distension and no mass. There is no abdominal tenderness. There is no rebound and no guarding.  Musculoskeletal:        General: No tenderness or edema. Normal range of motion.     Cervical back: Normal range of motion and neck supple.  Neurological: She is alert and oriented to person, place, and time.  Chronic left lower extremity weakness muscle atrophy-secondary to polio.  Skin: Skin is warm.  Psychiatric: Affect normal.  ;    LABORATORY DATA:  I have reviewed the data as listed    Component Value Date/Time   NA 138 12/12/2019 1239   NA 140 05/15/2013 1038   K 3.7 12/12/2019 1239   K 3.6 05/15/2013 1038   CL 103 12/12/2019 1239   CL 106 05/15/2013 1038   CO2 27 12/12/2019 1239   CO2 29 05/15/2013 1038   GLUCOSE 87  12/12/2019 1239   GLUCOSE 95 05/15/2013 1038   BUN 10 12/12/2019 1239   BUN 7 05/15/2013 1038   CREATININE 0.56 12/12/2019 1239   CREATININE 0.63 05/28/2014 1028   CALCIUM 8.6 (L) 12/12/2019 1239   CALCIUM 9.2 05/15/2013 1038   PROT 6.9 12/12/2019 1239   PROT 6.8 05/28/2014 1028   ALBUMIN 3.3 (L) 12/12/2019 1239   ALBUMIN 3.1 (L) 05/28/2014 1028   AST 22 12/12/2019 1239   AST 22 05/28/2014 1028   ALT 11 12/12/2019 1239   ALT 21 05/28/2014 1028   ALKPHOS 51 12/12/2019 1239   ALKPHOS 98 05/28/2014 1028  BILITOT 0.5 12/12/2019 1239   BILITOT 0.4 05/28/2014 1028   GFRNONAA >60 12/12/2019 1239   GFRNONAA >60 05/28/2014 1028   GFRAA >60 12/12/2019 1239   GFRAA >60 05/28/2014 1028    No results found for: SPEP, UPEP  Lab Results  Component Value Date   WBC 9.0 12/12/2019   NEUTROABS 3.4 12/12/2019   HGB 11.1 (L) 12/12/2019   HCT 35.2 (L) 12/12/2019   MCV 96.7 12/12/2019   PLT 242 12/12/2019      Chemistry      Component Value Date/Time   NA 138 12/12/2019 1239   NA 140 05/15/2013 1038   K 3.7 12/12/2019 1239   K 3.6 05/15/2013 1038   CL 103 12/12/2019 1239   CL 106 05/15/2013 1038   CO2 27 12/12/2019 1239   CO2 29 05/15/2013 1038   BUN 10 12/12/2019 1239   BUN 7 05/15/2013 1038   CREATININE 0.56 12/12/2019 1239   CREATININE 0.63 05/28/2014 1028      Component Value Date/Time   CALCIUM 8.6 (L) 12/12/2019 1239   CALCIUM 9.2 05/15/2013 1038   ALKPHOS 51 12/12/2019 1239   ALKPHOS 98 05/28/2014 1028   AST 22 12/12/2019 1239   AST 22 05/28/2014 1028   ALT 11 12/12/2019 1239   ALT 21 05/28/2014 1028   BILITOT 0.5 12/12/2019 1239   BILITOT 0.4 05/28/2014 1028       RADIOGRAPHIC STUDIES: I have personally reviewed the radiological images as listed and agreed with the findings in the report. No results found.   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  Clinically stable.  # STAGE II  RIGHT BREAST CA/Lobular cancer-[s/p adjuvant Arimidex-06/2018]; WNL;   Stable.   # weight loss- 30 pounds/ in last 6 months; ? Etiology-overall stable.  # BMD- osteopenia- jan 2019-Mild hypocal 8.5;  continue calcium plus vitamin D twice a day.; will re-start in 6 months  # Dizzy spells-unclear etiology; question ENT/vertigo.  Defer to PCP for further recommendations  # DISPO: # follow up in 6 months- MD; labs- cbc/cmp/Prolia- Dr.B    Orders Placed This Encounter  Procedures  . CBC with Differential    Standing Status:   Future    Standing Expiration Date:   12/11/2020  . Comprehensive metabolic panel    Standing Status:   Future    Standing Expiration Date:   12/11/2020   All questions were answered. The patient knows to call the clinic with any problems, questions or concerns.      Cammie Sickle, MD 12/13/2019 8:44 AM

## 2020-01-03 ENCOUNTER — Other Ambulatory Visit: Payer: Self-pay

## 2020-01-03 ENCOUNTER — Encounter: Payer: Self-pay | Admitting: Ophthalmology

## 2020-01-08 ENCOUNTER — Other Ambulatory Visit: Payer: Self-pay

## 2020-01-08 ENCOUNTER — Other Ambulatory Visit
Admission: RE | Admit: 2020-01-08 | Discharge: 2020-01-08 | Disposition: A | Discharge status: Home / Self Care | Payer: Medicare HMO | Source: Ambulatory Visit | Attending: Ophthalmology | Admitting: Ophthalmology

## 2020-01-08 DIAGNOSIS — Z87891 Personal history of nicotine dependence: Secondary | ICD-10-CM | POA: Diagnosis not present

## 2020-01-08 DIAGNOSIS — H2512 Age-related nuclear cataract, left eye: Secondary | ICD-10-CM | POA: Diagnosis present

## 2020-01-08 DIAGNOSIS — Z01812 Encounter for preprocedural laboratory examination: Secondary | ICD-10-CM | POA: Insufficient documentation

## 2020-01-08 DIAGNOSIS — Z7982 Long term (current) use of aspirin: Secondary | ICD-10-CM | POA: Diagnosis not present

## 2020-01-08 DIAGNOSIS — Z8612 Personal history of poliomyelitis: Secondary | ICD-10-CM | POA: Diagnosis not present

## 2020-01-08 DIAGNOSIS — Z9011 Acquired absence of right breast and nipple: Secondary | ICD-10-CM | POA: Diagnosis not present

## 2020-01-08 DIAGNOSIS — Z853 Personal history of malignant neoplasm of breast: Secondary | ICD-10-CM | POA: Diagnosis not present

## 2020-01-08 DIAGNOSIS — K219 Gastro-esophageal reflux disease without esophagitis: Secondary | ICD-10-CM | POA: Diagnosis not present

## 2020-01-08 DIAGNOSIS — Z20822 Contact with and (suspected) exposure to covid-19: Secondary | ICD-10-CM | POA: Insufficient documentation

## 2020-01-08 DIAGNOSIS — Z79899 Other long term (current) drug therapy: Secondary | ICD-10-CM | POA: Diagnosis not present

## 2020-01-08 LAB — SARS CORONAVIRUS 2 (TAT 6-24 HRS): SARS Coronavirus 2: NEGATIVE

## 2020-01-08 NOTE — Discharge Instructions (Signed)

## 2020-01-10 ENCOUNTER — Ambulatory Visit: Payer: Medicare HMO | Admitting: Anesthesiology

## 2020-01-10 ENCOUNTER — Ambulatory Visit
Admission: RE | Admit: 2020-01-10 | Discharge: 2020-01-10 | Disposition: A | Discharge status: Home / Self Care | Payer: Medicare HMO | Attending: Ophthalmology | Admitting: Ophthalmology

## 2020-01-10 ENCOUNTER — Encounter
Admission: RE | Disposition: A | Discharge status: Home / Self Care | Payer: Self-pay | Source: Home / Self Care | Attending: Ophthalmology

## 2020-01-10 ENCOUNTER — Encounter: Payer: Self-pay | Admitting: Ophthalmology

## 2020-01-10 ENCOUNTER — Other Ambulatory Visit: Payer: Self-pay

## 2020-01-10 DIAGNOSIS — H2512 Age-related nuclear cataract, left eye: Secondary | ICD-10-CM | POA: Diagnosis not present

## 2020-01-10 DIAGNOSIS — Z9011 Acquired absence of right breast and nipple: Secondary | ICD-10-CM | POA: Insufficient documentation

## 2020-01-10 DIAGNOSIS — Z853 Personal history of malignant neoplasm of breast: Secondary | ICD-10-CM | POA: Insufficient documentation

## 2020-01-10 DIAGNOSIS — Z20822 Contact with and (suspected) exposure to covid-19: Secondary | ICD-10-CM | POA: Insufficient documentation

## 2020-01-10 DIAGNOSIS — Z79899 Other long term (current) drug therapy: Secondary | ICD-10-CM | POA: Insufficient documentation

## 2020-01-10 DIAGNOSIS — Z7982 Long term (current) use of aspirin: Secondary | ICD-10-CM | POA: Insufficient documentation

## 2020-01-10 DIAGNOSIS — Z87891 Personal history of nicotine dependence: Secondary | ICD-10-CM | POA: Insufficient documentation

## 2020-01-10 DIAGNOSIS — K219 Gastro-esophageal reflux disease without esophagitis: Secondary | ICD-10-CM | POA: Insufficient documentation

## 2020-01-10 DIAGNOSIS — Z8612 Personal history of poliomyelitis: Secondary | ICD-10-CM | POA: Insufficient documentation

## 2020-01-10 HISTORY — PX: CATARACT EXTRACTION W/PHACO: SHX586

## 2020-01-10 SURGERY — PHACOEMULSIFICATION, CATARACT, WITH IOL INSERTION
Anesthesia: Monitor Anesthesia Care | Site: Eye | Laterality: Left

## 2020-01-10 MED ORDER — NA HYALUR & NA CHOND-NA HYALUR 0.4-0.35 ML IO KIT
PACK | INTRAOCULAR | Status: DC | PRN
  Administered 2020-01-10: 1 mL via INTRAOCULAR

## 2020-01-10 MED ORDER — MOXIFLOXACIN HCL 0.5 % OP SOLN
1.0000 [drp] | OPHTHALMIC | Status: DC | PRN
  Administered 2020-01-10 (×3): 1 [drp] via OPHTHALMIC

## 2020-01-10 MED ORDER — CEFUROXIME OPHTHALMIC INJECTION 1 MG/0.1 ML
INJECTION | OPHTHALMIC | Status: DC | PRN
  Administered 2020-01-10: 0.1 mL via INTRACAMERAL

## 2020-01-10 MED ORDER — TETRACAINE HCL 0.5 % OP SOLN
1.0000 [drp] | OPHTHALMIC | Status: DC | PRN
  Administered 2020-01-10 (×2): 1 [drp] via OPHTHALMIC

## 2020-01-10 MED ORDER — MIDAZOLAM HCL 2 MG/2ML IJ SOLN
INTRAMUSCULAR | Status: DC | PRN
  Administered 2020-01-10 (×2): 1 mg via INTRAVENOUS

## 2020-01-10 MED ORDER — ACETAMINOPHEN 325 MG PO TABS: 325.0000 mg | ORAL_TABLET | Freq: Once | ORAL | Status: DC

## 2020-01-10 MED ORDER — ACETAMINOPHEN 160 MG/5ML PO SOLN: 325.0000 mg | Freq: Once | ORAL | Status: DC

## 2020-01-10 MED ORDER — FENTANYL CITRATE (PF) 100 MCG/2ML IJ SOLN
INTRAMUSCULAR | Status: DC | PRN
  Administered 2020-01-10 (×2): 50 ug via INTRAVENOUS

## 2020-01-10 MED ORDER — LIDOCAINE HCL (PF) 2 % IJ SOLN: INTRAOCULAR | Status: DC | PRN

## 2020-01-10 MED ORDER — LACTATED RINGERS IV SOLN: 10.0000 mL/h | INTRAVENOUS | Status: DC

## 2020-01-10 MED ORDER — BRIMONIDINE TARTRATE-TIMOLOL 0.2-0.5 % OP SOLN
OPHTHALMIC | Status: DC | PRN
  Administered 2020-01-10: 1 [drp] via OPHTHALMIC

## 2020-01-10 MED ORDER — EPINEPHRINE PF 1 MG/ML IJ SOLN
INTRAOCULAR | Status: DC | PRN
  Administered 2020-01-10: 53 mL via OPHTHALMIC

## 2020-01-10 MED ORDER — ARMC OPHTHALMIC DILATING DROPS
1.0000 "application " | OPHTHALMIC | Status: DC | PRN
  Administered 2020-01-10 (×3): 1 via OPHTHALMIC

## 2020-01-10 SURGICAL SUPPLY — 29 items
CANNULA ANT/CHMB 27G (MISCELLANEOUS) ×1 IMPLANT
CANNULA ANT/CHMB 27GA (MISCELLANEOUS) ×3 IMPLANT
GLOVE SURG LX 7.5 STRW (GLOVE) ×4
GLOVE SURG LX STRL 7.5 STRW (GLOVE) ×1 IMPLANT
GLOVE SURG TRIUMPH 8.0 PF LTX (GLOVE) ×3 IMPLANT
GOWN STRL REUS W/ TWL LRG LVL3 (GOWN DISPOSABLE) ×2 IMPLANT
GOWN STRL REUS W/TWL LRG LVL3 (GOWN DISPOSABLE) ×4
LENS IOL ACRYSOF IQ 19.5 (Intraocular Lens) ×2 IMPLANT
MARKER SKIN DUAL TIP RULER LAB (MISCELLANEOUS) ×3 IMPLANT
NDL CAPSULORHEX 25GA (NEEDLE) ×1 IMPLANT
NDL FILTER BLUNT 18X1 1/2 (NEEDLE) ×2 IMPLANT
NDL RETROBULBAR .5 NSTRL (NEEDLE) IMPLANT
NEEDLE CAPSULORHEX 25GA (NEEDLE) ×3 IMPLANT
NEEDLE FILTER BLUNT 18X 1/2SAF (NEEDLE) ×4
NEEDLE FILTER BLUNT 18X1 1/2 (NEEDLE) ×2 IMPLANT
PACK CATARACT BRASINGTON (MISCELLANEOUS) ×3 IMPLANT
PACK EYE AFTER SURG (MISCELLANEOUS) ×3 IMPLANT
PACK OPTHALMIC (MISCELLANEOUS) ×3 IMPLANT
RING MALYGIN 7.0 (MISCELLANEOUS) IMPLANT
SOLUTION OPHTHALMIC SALT (MISCELLANEOUS) ×3 IMPLANT
SUT ETHILON 10-0 CS-B-6CS-B-6 (SUTURE)
SUT VICRYL  9 0 (SUTURE)
SUT VICRYL 9 0 (SUTURE) IMPLANT
SUTURE EHLN 10-0 CS-B-6CS-B-6 (SUTURE) IMPLANT
SYR 3ML LL SCALE MARK (SYRINGE) ×6 IMPLANT
SYR TB 1ML LUER SLIP (SYRINGE) ×3 IMPLANT
WATER STERILE IRR 250ML POUR (IV SOLUTION) ×3 IMPLANT
WICK EYE OCUCEL (MISCELLANEOUS) ×2 IMPLANT
WIPE NON LINTING 3.25X3.25 (MISCELLANEOUS) ×3 IMPLANT

## 2020-01-10 NOTE — Anesthesia Preprocedure Evaluation (Signed)
Anesthesia Evaluation  Patient identified by MRN, date of birth, ID band Patient awake    Reviewed: Allergy & Precautions, H&P , NPO status , Patient's Chart, lab work & pertinent test results  History of Anesthesia Complications (+) PONV  Airway Mallampati: II  TM Distance: >3 FB Neck ROM: full    Dental no notable dental hx. (+) Missing   Pulmonary former smoker,    Pulmonary exam normal breath sounds clear to auscultation       Cardiovascular Normal cardiovascular exam Rhythm:regular Rate:Normal     Neuro/Psych Seizures -,  H/o Polio    GI/Hepatic GERD  ,  Endo/Other    Renal/GU      Musculoskeletal   Abdominal   Peds  Hematology   Anesthesia Other Findings   Reproductive/Obstetrics                             Anesthesia Physical Anesthesia Plan  ASA: III  Anesthesia Plan: MAC   Post-op Pain Management:    Induction:   PONV Risk Score and Plan: 3 and Treatment may vary due to age or medical condition, TIVA and Midazolam  Airway Management Planned:   Additional Equipment:   Intra-op Plan:   Post-operative Plan:   Informed Consent: I have reviewed the patients History and Physical, chart, labs and discussed the procedure including the risks, benefits and alternatives for the proposed anesthesia with the patient or authorized representative who has indicated his/her understanding and acceptance.     Dental Advisory Given  Plan Discussed with: CRNA  Anesthesia Plan Comments:         Anesthesia Quick Evaluation

## 2020-01-10 NOTE — Anesthesia Postprocedure Evaluation (Signed)
Anesthesia Post Note  Patient: Erika Miller  Procedure(s) Performed: CATARACT EXTRACTION PHACO AND INTRAOCULAR LENS PLACEMENT (IOC) LEFT (Left Eye)     Patient location during evaluation: PACU Anesthesia Type: MAC Level of consciousness: awake and alert and oriented Pain management: satisfactory to patient Vital Signs Assessment: post-procedure vital signs reviewed and stable Respiratory status: spontaneous breathing, nonlabored ventilation and respiratory function stable Cardiovascular status: blood pressure returned to baseline and stable Postop Assessment: Adequate PO intake and No signs of nausea or vomiting Anesthetic complications: no    Raliegh Ip

## 2020-01-10 NOTE — Transfer of Care (Signed)
Immediate Anesthesia Transfer of Care Note  Patient: Erika Miller  Procedure(s) Performed: CATARACT EXTRACTION PHACO AND INTRAOCULAR LENS PLACEMENT (IOC) LEFT (Left Eye)  Patient Location: PACU  Anesthesia Type: MAC  Level of Consciousness: awake, alert  and patient cooperative  Airway and Oxygen Therapy: Patient Spontanous Breathing and Patient connected to supplemental oxygen  Post-op Assessment: Post-op Vital signs reviewed, Patient's Cardiovascular Status Stable, Respiratory Function Stable, Patent Airway and No signs of Nausea or vomiting  Post-op Vital Signs: Reviewed and stable  Complications: No apparent anesthesia complications

## 2020-01-10 NOTE — H&P (Signed)

## 2020-01-10 NOTE — Op Note (Signed)
OPERATIVE NOTE  SHANIKA REZABEK [id number] 01/10/2020   PREOPERATIVE DIAGNOSIS:  Nuclear sclerotic cataract left eye. H25.12   POSTOPERATIVE DIAGNOSIS:    Nuclear sclerotic cataract left eye.     PROCEDURE:  Phacoemusification with posterior chamber intraocular lens placement of the left eye  Ultrasound time: Procedure(s) with comments: CATARACT EXTRACTION PHACO AND INTRAOCULAR LENS PLACEMENT (IOC) LEFT (Left) - 9.13 0:59.7 15.3%  LENS:   Implant Name Type Inv. Item Serial No. Manufacturer Lot No. LRB No. Used Action  LENS IOL ACRYSOF IQ 19.5 - [id number] Intraocular Lens LENS IOL ACRYSOF IQ 19.5 [id number] ALCON  Left 1 Implanted      SURGEON:  Wyonia Hough, MD   ANESTHESIA:  Topical with tetracaine drops and 2% Xylocaine jelly, augmented with 1% preservative-free intracameral lidocaine.    COMPLICATIONS:  None.   DESCRIPTION OF PROCEDURE:  The patient was identified in the holding room and transported to the operating room and placed in the supine position under the operating microscope.  The left eye was identified as the operative eye and it was prepped and draped in the usual sterile ophthalmic fashion.   A 1 millimeter clear-corneal paracentesis was made at the 1:30 position.  0.5 ml of preservative-free 1% lidocaine was injected into the anterior chamber.  The anterior chamber was filled with Viscoat viscoelastic.  A 2.4 millimeter keratome was used to make a near-clear corneal incision at the 10:30 position.  .  A curvilinear capsulorrhexis was made with a cystotome and capsulorrhexis forceps.  Balanced salt solution was used to hydrodissect and hydrodelineate the nucleus.   Phacoemulsification was then used in stop and chop fashion to remove the lens nucleus and epinucleus.  The remaining cortex was then removed using the irrigation and aspiration handpiece. Provisc was then placed into the capsular bag to distend it for lens placement.  A lens was then injected  into the capsular bag.  The remaining viscoelastic was aspirated.   Wounds were hydrated with balanced salt solution.  The anterior chamber was inflated to a physiologic pressure with balanced salt solution.  No wound leaks were noted. Cefuroxime 0.1 ml of a 10mg /ml solution was injected into the anterior chamber for a dose of 1 mg of intracameral antibiotic at the completion of the case.   Timolol and Brimonidine drops were applied to the eye.  The patient was taken to the recovery room in stable condition without complications of anesthesia or surgery.  Tallulah Hosman 01/10/2020, 9:35 AM

## 2020-01-11 ENCOUNTER — Encounter: Payer: Self-pay | Admitting: *Deleted

## 2020-03-06 ENCOUNTER — Other Ambulatory Visit: Payer: Self-pay | Admitting: General Surgery

## 2020-03-06 DIAGNOSIS — Z1239 Encounter for other screening for malignant neoplasm of breast: Secondary | ICD-10-CM

## 2020-04-16 ENCOUNTER — Ambulatory Visit
Admission: RE | Admit: 2020-04-16 | Discharge: 2020-04-16 | Disposition: A | Discharge status: Home / Self Care | Payer: Medicare HMO | Source: Ambulatory Visit | Attending: General Surgery | Admitting: General Surgery

## 2020-04-16 DIAGNOSIS — Z1231 Encounter for screening mammogram for malignant neoplasm of breast: Secondary | ICD-10-CM | POA: Diagnosis present

## 2020-04-16 DIAGNOSIS — Z9011 Acquired absence of right breast and nipple: Secondary | ICD-10-CM | POA: Diagnosis not present

## 2020-04-16 DIAGNOSIS — Z1239 Encounter for other screening for malignant neoplasm of breast: Secondary | ICD-10-CM

## 2020-06-11 ENCOUNTER — Inpatient Hospital Stay (HOSPITAL_BASED_OUTPATIENT_CLINIC_OR_DEPARTMENT_OTHER): Payer: Medicare HMO | Admitting: Internal Medicine

## 2020-06-11 ENCOUNTER — Inpatient Hospital Stay: Payer: Medicare HMO

## 2020-06-11 ENCOUNTER — Encounter: Payer: Self-pay | Admitting: Internal Medicine

## 2020-06-11 ENCOUNTER — Other Ambulatory Visit: Payer: Self-pay

## 2020-06-11 ENCOUNTER — Inpatient Hospital Stay: Payer: Medicare HMO | Attending: Internal Medicine

## 2020-06-11 VITALS — BP 124/98 | HR 80 | Temp 98.0°F | Resp 16 | Ht 60.0 in | Wt 115.0 lb

## 2020-06-11 DIAGNOSIS — C50811 Malignant neoplasm of overlapping sites of right female breast: Secondary | ICD-10-CM | POA: Insufficient documentation

## 2020-06-11 DIAGNOSIS — Z79899 Other long term (current) drug therapy: Secondary | ICD-10-CM | POA: Insufficient documentation

## 2020-06-11 DIAGNOSIS — Z79811 Long term (current) use of aromatase inhibitors: Secondary | ICD-10-CM | POA: Diagnosis not present

## 2020-06-11 DIAGNOSIS — Z923 Personal history of irradiation: Secondary | ICD-10-CM | POA: Diagnosis not present

## 2020-06-11 DIAGNOSIS — D72820 Lymphocytosis (symptomatic): Secondary | ICD-10-CM | POA: Diagnosis not present

## 2020-06-11 DIAGNOSIS — Z9011 Acquired absence of right breast and nipple: Secondary | ICD-10-CM | POA: Insufficient documentation

## 2020-06-11 DIAGNOSIS — M8580 Other specified disorders of bone density and structure, unspecified site: Secondary | ICD-10-CM | POA: Insufficient documentation

## 2020-06-11 DIAGNOSIS — Z170 Estrogen receptor positive status [ER+]: Secondary | ICD-10-CM | POA: Insufficient documentation

## 2020-06-11 DIAGNOSIS — Z9221 Personal history of antineoplastic chemotherapy: Secondary | ICD-10-CM | POA: Diagnosis not present

## 2020-06-11 DIAGNOSIS — M85859 Other specified disorders of bone density and structure, unspecified thigh: Secondary | ICD-10-CM

## 2020-06-11 LAB — COMPREHENSIVE METABOLIC PANEL
ALT: 11 U/L (ref 0–44)
AST: 20 U/L (ref 15–41)
Albumin: 3.2 g/dL — ABNORMAL LOW (ref 3.5–5.0)
Alkaline Phosphatase: 67 U/L (ref 38–126)
Anion gap: 6 (ref 5–15)
BUN: 7 mg/dL — ABNORMAL LOW (ref 8–23)
CO2: 31 mmol/L (ref 22–32)
Calcium: 8.6 mg/dL — ABNORMAL LOW (ref 8.9–10.3)
Chloride: 102 mmol/L (ref 98–111)
Creatinine, Ser: 0.59 mg/dL (ref 0.44–1.00)
GFR calc Af Amer: >60 mL/min (ref >60)
GFR calc non Af Amer: >60 mL/min (ref >60)
Glucose, Bld: 97 mg/dL (ref 70–99)
Potassium: 3.6 mmol/L (ref 3.5–5.1)
Sodium: 139 mmol/L (ref 135–145)
Total Bilirubin: 0.4 mg/dL (ref 0.3–1.2)
Total Protein: 6.9 g/dL (ref 6.5–8.1)

## 2020-06-11 LAB — CBC WITH DIFFERENTIAL/PLATELET
Abs Immature Granulocytes: 0.02 10*3/uL (ref 0.00–0.07)
Basophils Absolute: 0.1 10*3/uL (ref 0.0–0.1)
Basophils Relative: 1 %
Eosinophils Absolute: 0.3 10*3/uL (ref 0.0–0.5)
Eosinophils Relative: 3 %
HCT: 33.6 % — ABNORMAL LOW (ref 36.0–46.0)
Hemoglobin: 11.2 g/dL — ABNORMAL LOW (ref 12.0–15.0)
Immature Granulocytes: 0 %
Lymphocytes Relative: 61 %
Lymphs Abs: 4.6 10*3/uL — ABNORMAL HIGH (ref 0.7–4.0)
MCH: 31.4 pg (ref 26.0–34.0)
MCHC: 33.3 g/dL (ref 30.0–36.0)
MCV: 94.1 fL (ref 80.0–100.0)
Monocytes Absolute: 0.6 10*3/uL (ref 0.1–1.0)
Monocytes Relative: 7 %
Neutro Abs: 2.2 10*3/uL (ref 1.7–7.7)
Neutrophils Relative %: 28 %
Platelets: 240 10*3/uL (ref 150–400)
RBC: 3.57 MIL/uL — ABNORMAL LOW (ref 3.87–5.11)
RDW: 13.6 % (ref 11.5–15.5)
WBC: 7.7 10*3/uL (ref 4.0–10.5)
nRBC: 0 % (ref 0.0–0.2)

## 2020-06-11 MED ORDER — DENOSUMAB 60 MG/ML ~~LOC~~ SOSY
60.0000 mg | PREFILLED_SYRINGE | Freq: Once | SUBCUTANEOUS | Status: AC
  Administered 2020-06-11: 60 mg via SUBCUTANEOUS
  Filled 2020-06-11: qty 1

## 2020-06-11 NOTE — Assessment & Plan Note (Addendum)
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  Clinically stable.  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 stable.  # BMD- osteopenia- jan 2019-Mild hypocal 8.6  continue calcium plus vitamin D twice a day; continue Prolia.  Will repeat bone density prior to next visit.  # DISPO: # Prolia today # follow up in 6 months- MD; labs- cbc/cmp/Prolia; BMD prior-- Dr.B

## 2020-06-11 NOTE — Progress Notes (Signed)
[place] OFFICE PROGRESS NOTE  Patient Care Team: Gayland Curry, MD as PCP - General (Family Medicine) Bary Castilla, Forest Gleason, MD (General Surgery) Gayland Curry, MD as Referring Physician [facility])  Cancer Staging No matching staging information was found for the patient.   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   # AUG 2012-? Mild-Leucocytosis/lymphocytosis [? 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   # Post Polio neuropathy/wheel chair   # FEB 2019- T-score of -2.3 [osteopenia] AUG 2019- START PROLIA q 6M ---------------------------------------------  DIAGNOSIS: LOBULAR BREAST CA  STAGE:  II       ;GOALS: CURE  CURRENT/MOST RECENT THERAPY: Arimdex [sep 0539]    Malignant neoplasm of overlapping sites of right breast in female, estrogen receptor positive ([place])  11/24/2019 Genetic Testing   BARD1 c.166A>G VUS identified on the common hereditary cancer panel.  The Common Hereditary Gene Panel offered by [facility] includes sequencing and/or deletion duplication testing of the following 48 genes: APC, ATM, AXIN2, BARD1, BMPR1A, BRCA1, BRCA2, BRIP1, CDH1, CDK4, CDKN2A (p14ARF), CDKN2A (p16INK4a), CHEK2, CTNNA1, DICER1, EPCAM (Deletion/duplication testing only), GREM1 (promoter region deletion/duplication testing only), KIT, MEN1, MLH1, MSH2, MSH3, MSH6, MUTYH, NBN, NF1, NHTL1, PALB2, PDGFRA, PMS2, POLD1, POLE, PTEN, RAD50, RAD51C, RAD51D, RNF43, SDHB, SDHC, SDHD, SMAD4, SMARCA4. STK11, TP53, TSC1, TSC2, and VHL.  The following genes were evaluated for sequence changes only: SDHA and HOXB13 c.251G>A variant only. The  report date is November 24, 2019.       INTERVAL HISTORY:  Erika Miller 74 y.o.  female pleasant patient above history of stage II lobular breast cancer currently on on surveillance; mild lymphocytosis/recent weight loss; osteopenia on Prolia is here for follow-up.  In the interim patient underwent EGD/colonoscopy for ongoing weight loss-no clear etiology found.  Appetite is fair.  Weight is holding steady currently.  No new lumps or bumps.  Chronic mild fatigue.   Review of Systems  Constitutional: Positive for malaise/fatigue and weight loss. Negative for chills, diaphoresis and fever.  HENT: Negative for nosebleeds and sore throat.   Eyes: Negative for double vision.  Respiratory: Negative for cough, hemoptysis, sputum production, shortness of breath and wheezing.   Cardiovascular: Negative for chest pain, palpitations, orthopnea and leg swelling.  Gastrointestinal: Negative for abdominal pain, blood in stool, constipation, diarrhea, heartburn, melena, nausea and vomiting.  Genitourinary: Negative for dysuria, frequency and urgency.  Musculoskeletal: Negative for back pain and joint pain.  Skin: Negative.  Negative for itching and rash.  Neurological: Negative for dizziness, tingling, weakness and headaches. 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  Endo/Heme/Allergies: Does not bruise/bleed easily.  Psychiatric/Behavioral: Negative for depression. The patient is not nervous/anxious and does not have insomnia.      PAST MEDICAL HISTORY :  Past Medical History:  Diagnosis Date  . Arthritis    hands,knees,shoulders  . Breast cancer, right breast ([place]) 09/2013   Right Breast, pT2,N0 (l+sn)  . Dysrhythmia    heart skipped beat per patient, checked by Dr Saralyn Pilar  . Family history of breast cancer   . Family history of ovarian cancer   . GERD (gastroesophageal reflux disease)   . Hyperlipidemia   .  Leukocytosis   . Lymphocytosis    low-grade  clonal T-cell disorder  . Malignant neoplasm of lower-inner quadrant of female breast [facility])    right, s/p chemotherapy  . 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 Osteopenia   . Osteoporosis   . Personal history of chemotherapy   . 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 PONV (postoperative nausea and vomiting) 1 time   after hysterectomy  . Reflux   . Scoliosis   . Seizures ([place]) x3   Dr [facility] could find nothing wrong.Dec 2015  . Transfusion history     PAST SURGICAL HISTORY :   Past Surgical History:  Procedure Laterality Date  . ABDOMINAL HYSTERECTOMY  08/10/83   ovaries were not removed  . BACK SURGERY     x 2  . BREAST BIOPSY Right 2008, 2013   2008 negative, 2013 positive  . BREAST BIOPSY Left 04-24-14   Calcifications associated with stroma and sclerosing adenosis  . BREAST SURGERY Right 10/2012   Mastectomy with s/n bx, and radiation therapy  . CATARACT EXTRACTION W/PHACO Right 02/20/2015   Procedure: CATARACT EXTRACTION PHACO AND INTRAOCULAR LENS PLACEMENT (IOC);  Surgeon: Leandrew Koyanagi, MD;  Location: [place];  Service: Ophthalmology;  Laterality: Right;  . CATARACT EXTRACTION W/PHACO Left 01/10/2020   Procedure: CATARACT EXTRACTION PHACO AND INTRAOCULAR LENS PLACEMENT ([place]) LEFT;  Surgeon: Leandrew Koyanagi, MD;  Location: [place];  Service: Ophthalmology;  Laterality: Left;  9.13 0:59.7 15.3%  . COLONOSCOPY  2011   Dr. Candace Cruise, 2 benign polyps removed  . COLONOSCOPY WITH PROPOFOL N/A 08/12/2015   Procedure: COLONOSCOPY WITH PROPOFOL;  Surgeon: Hulen Luster, MD;  Location: [facility] ENDOSCOPY;  Service: Gastroenterology;  Laterality: N/A;  . COLONOSCOPY WITH PROPOFOL N/A 10/30/2019   Procedure: COLONOSCOPY WITH PROPOFOL;  Surgeon: Toledo, Benay Pike, MD;  Location: [facility] ENDOSCOPY;  Service: Gastroenterology;  Laterality: N/A;  . ESOPHAGOGASTRODUODENOSCOPY (EGD) WITH  PROPOFOL N/A 08/12/2015   Procedure: ESOPHAGOGASTRODUODENOSCOPY (EGD) WITH PROPOFOL;  Surgeon: Hulen Luster, MD;  Location: [facility] ENDOSCOPY;  Service: Gastroenterology;  Laterality: N/A;  . ESOPHAGOGASTRODUODENOSCOPY (EGD) WITH PROPOFOL N/A 10/30/2019   Procedure: ESOPHAGOGASTRODUODENOSCOPY (EGD) WITH PROPOFOL;  Surgeon: Toledo, Benay Pike, MD;  Location: [facility] ENDOSCOPY;  Service: Gastroenterology;  Laterality: N/A;  . FOOT SURGERY Right 1988  . LEG SURGERY Bilateral 1957, 1958  . MASTECTOMY Right   . PORTACATH PLACEMENT  10/2012   recently removed  . [employer]   . TUBAL LIGATION      FAMILY HISTORY :   Family History  Problem Relation Age of Onset  . Breast cancer Sister 35  . Ovarian cancer Sister   . Lung cancer Other   . Dementia Mother   . Lung cancer Father   . Dementia Maternal Uncle   . Diabetes Paternal Uncle   . Cancer Maternal Grandmother        "female cancer"  . Heart attack Maternal Uncle     SOCIAL HISTORY:   Social History   Tobacco Use  . Smoking status: Former Smoker    Packs/day: 1.00    Years: 35.00    Pack years: 35.00    Types: Cigarettes    Quit date: 10/20/2003    Years since quitting: 16.6  . Smokeless tobacco: Never Used  . Tobacco comment: Smoking History stopped smoking in 2005. smoked 1 pk/per day x 32 yrs  Vaping  Use  . Vaping Use: Never used  Substance Use Topics  . Alcohol use: No    Alcohol/week: 0.0 standard drinks  . Drug use: No    ALLERGIES:  is allergic to augmentin [amoxicillin-pot clavulanate] and macrobid [nitrofurantoin macrocrystal].  MEDICATIONS:  Current Outpatient Medications  Medication Sig Dispense Refill  . aspirin EC 81 MG tablet Take by mouth.    Marland Kitchen atorvastatin (LIPITOR) 20 MG tablet Take 20 mg by mouth daily.    . Calcium Carbonate (CALCIUM 600 PO) Take 1 tablet by mouth daily. Dinner    . cetirizine (ZYRTEC) 10 MG tablet Take 10 mg by mouth daily. AM    . Cholecalciferol (VITAMIN D3) 2000 UNITS TABS  Take 1 tablet by mouth daily. Dinner    . denosumab (PROLIA) 60 MG/ML SOSY injection Inject 60 mg into the skin every 6 (six) months.    . famotidine (PEPCID) 20 MG tablet Take 20 mg by mouth daily. Am    . gabapentin (NEURONTIN) 300 MG capsule Take 1 capsule (300 mg total) by mouth 3 (three) times daily. 90 capsule 3  . ibuprofen (ADVIL,MOTRIN) 200 MG tablet Take 200 mg by mouth every 6 (six) hours as needed.    . Multiple Vitamin (MULTIVITAMIN) tablet Take 1 tablet by mouth daily. Dinner    . neomycin-polymyxin b-dexamethasone (MAXITROL) 3.5-10000-0.1 OINT Place 1 application into the left eye at bedtime.  0  . nicotine polacrilex (NICORETTE) 2 MG gum Take 2 mg by mouth as needed for smoking cessation.    . prednisoLONE acetate (PRED FORTE) 1 % ophthalmic suspension Place 1 drop into the left eye daily. (Patient not taking: Reported on 06/11/2020)  0   No current facility-administered medications for this visit.    PHYSICAL EXAMINATION: ECOG PERFORMANCE STATUS: 2 - Symptomatic, <50% confined to bed  BP (!) 124/98 (BP Location: Left Arm, Patient Position: Sitting, Cuff Size: Normal)   Pulse 80   Temp 98 F (36.7 C) (Tympanic)   Resp 16   Ht 5' (1.524 m)   Wt 115 lb (52.2 kg)   SpO2 99%   BMI 22.46 kg/m   Filed Weights   06/11/20 1314  Weight: 115 lb (52.2 kg)    Physical Exam Constitutional:      Comments: In a wheelchair.  Chronic/polio.  Alone.   Thin built; frail appearing.  HENT:     Head: Normocephalic and atraumatic.     Mouth/Throat:     Pharynx: No oropharyngeal exudate.  Eyes:     Pupils: Pupils are equal, round, and reactive to light.  Cardiovascular:     Rate and Rhythm: Normal rate and regular rhythm.  Pulmonary:     Effort: Pulmonary effort is normal. No respiratory distress.     Breath sounds: Normal breath sounds. No wheezing.  Abdominal:     General: Bowel sounds are normal. There is no distension.     Palpations: Abdomen is soft. There is no mass.      Tenderness: There is no abdominal tenderness. There is no guarding or rebound.  Musculoskeletal:        General: No tenderness. Normal range of motion.     Cervical back: Normal range of motion and neck supple.  Skin:    General: Skin is warm.  Neurological:     Mental Status: She is alert and oriented to person, place, and time.     Comments: Chronic left lower extremity weakness muscle atrophy-secondary to polio.  Psychiatric:  Mood and Affect: Affect normal.   ;    LABORATORY DATA:  I have reviewed the data as listed    Component Value Date/Time   NA 139 06/11/2020 1305   NA 140 05/15/2013 1038   K 3.6 06/11/2020 1305   K 3.6 05/15/2013 1038   CL 102 06/11/2020 1305   CL 106 05/15/2013 1038   CO2 31 06/11/2020 1305   CO2 29 05/15/2013 1038   GLUCOSE 97 06/11/2020 1305   GLUCOSE 95 05/15/2013 1038   BUN 7 (L) 06/11/2020 1305   BUN 7 05/15/2013 1038   CREATININE 0.59 06/11/2020 1305   CREATININE 0.63 05/28/2014 1028   CALCIUM 8.6 (L) 06/11/2020 1305   CALCIUM 9.2 05/15/2013 1038   PROT 6.9 06/11/2020 1305   PROT 6.8 05/28/2014 1028   ALBUMIN 3.2 (L) 06/11/2020 1305   ALBUMIN 3.1 (L) 05/28/2014 1028   AST 20 06/11/2020 1305   AST 22 05/28/2014 1028   ALT 11 06/11/2020 1305   ALT 21 05/28/2014 1028   ALKPHOS 67 06/11/2020 1305   ALKPHOS 98 05/28/2014 1028   BILITOT 0.4 06/11/2020 1305   BILITOT 0.4 05/28/2014 1028   GFRNONAA >60 06/11/2020 1305   GFRNONAA >60 05/28/2014 1028   GFRAA >60 06/11/2020 1305   GFRAA >60 05/28/2014 1028    No results found for: SPEP, UPEP  Lab Results  Component Value Date   WBC 7.7 06/11/2020   NEUTROABS 2.2 06/11/2020   HGB 11.2 (L) 06/11/2020   HCT 33.6 (L) 06/11/2020   MCV 94.1 06/11/2020   PLT 240 06/11/2020      Chemistry      Component Value Date/Time   NA 139 06/11/2020 1305   NA 140 05/15/2013 1038   K 3.6 06/11/2020 1305   K 3.6 05/15/2013 1038   CL 102 06/11/2020 1305   CL 106 05/15/2013 1038   CO2  31 06/11/2020 1305   CO2 29 05/15/2013 1038   BUN 7 (L) 06/11/2020 1305   BUN 7 05/15/2013 1038   CREATININE 0.59 06/11/2020 1305   CREATININE 0.63 05/28/2014 1028      Component Value Date/Time   CALCIUM 8.6 (L) 06/11/2020 1305   CALCIUM 9.2 05/15/2013 1038   ALKPHOS 67 06/11/2020 1305   ALKPHOS 98 05/28/2014 1028   AST 20 06/11/2020 1305   AST 22 05/28/2014 1028   ALT 11 06/11/2020 1305   ALT 21 05/28/2014 1028   BILITOT 0.4 06/11/2020 1305   BILITOT 0.4 05/28/2014 1028       RADIOGRAPHIC STUDIES: I have personally reviewed the radiological images as listed and agreed with the findings in the report. No results found.   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  Clinically stable.  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 stable.  # BMD- osteopenia- jan 2019-Mild hypocal 8.6  continue calcium plus vitamin D twice a day; continue Prolia.  Will repeat bone density prior to next visit.  # DISPO: # Prolia today # follow up in 6 months- MD; labs- cbc/cmp/Prolia; BMD prior-- Dr.B    Orders Placed This Encounter  Procedures  . DG Bone Density    Standing Status:   Future    Standing  Expiration Date:   06/11/2021    Order Specific Question:   Reason for Exam (SYMPTOM  OR DIAGNOSIS REQUIRED)    Answer:   Hx of breast cancer, aromatase inhibitor use    Order Specific Question:   Preferred imaging location?    Answer:   [facility]   All questions were answered. The patient knows to call the clinic with any problems, questions or concerns.      Cammie Sickle, MD 06/11/2020 2:07 PM

## 2020-12-09 ENCOUNTER — Ambulatory Visit
Admission: RE | Admit: 2020-12-09 | Discharge: 2020-12-09 | Disposition: A | Discharge status: Home / Self Care | Payer: Medicare HMO | Source: Ambulatory Visit | Attending: Internal Medicine | Admitting: Internal Medicine

## 2020-12-09 ENCOUNTER — Other Ambulatory Visit: Payer: Self-pay

## 2020-12-09 DIAGNOSIS — Z79811 Long term (current) use of aromatase inhibitors: Secondary | ICD-10-CM | POA: Diagnosis present

## 2020-12-09 DIAGNOSIS — D72820 Lymphocytosis (symptomatic): Secondary | ICD-10-CM | POA: Diagnosis present

## 2020-12-10 ENCOUNTER — Encounter: Payer: Self-pay | Admitting: Internal Medicine

## 2020-12-10 ENCOUNTER — Inpatient Hospital Stay: Payer: Medicare HMO

## 2020-12-10 ENCOUNTER — Inpatient Hospital Stay: Payer: Medicare HMO | Attending: Internal Medicine

## 2020-12-10 ENCOUNTER — Inpatient Hospital Stay (HOSPITAL_BASED_OUTPATIENT_CLINIC_OR_DEPARTMENT_OTHER): Payer: Medicare HMO | Admitting: Internal Medicine

## 2020-12-10 ENCOUNTER — Other Ambulatory Visit: Payer: Self-pay

## 2020-12-10 DIAGNOSIS — M8580 Other specified disorders of bone density and structure, unspecified site: Secondary | ICD-10-CM | POA: Diagnosis not present

## 2020-12-10 DIAGNOSIS — D72820 Lymphocytosis (symptomatic): Secondary | ICD-10-CM

## 2020-12-10 DIAGNOSIS — Z79899 Other long term (current) drug therapy: Secondary | ICD-10-CM | POA: Diagnosis not present

## 2020-12-10 DIAGNOSIS — C50811 Malignant neoplasm of overlapping sites of right female breast: Secondary | ICD-10-CM

## 2020-12-10 DIAGNOSIS — Z853 Personal history of malignant neoplasm of breast: Secondary | ICD-10-CM | POA: Diagnosis not present

## 2020-12-10 DIAGNOSIS — Z90710 Acquired absence of both cervix and uterus: Secondary | ICD-10-CM | POA: Insufficient documentation

## 2020-12-10 DIAGNOSIS — R5383 Other fatigue: Secondary | ICD-10-CM | POA: Diagnosis not present

## 2020-12-10 DIAGNOSIS — Z170 Estrogen receptor positive status [ER+]: Secondary | ICD-10-CM | POA: Insufficient documentation

## 2020-12-10 DIAGNOSIS — Z87891 Personal history of nicotine dependence: Secondary | ICD-10-CM | POA: Diagnosis not present

## 2020-12-10 DIAGNOSIS — M85859 Other specified disorders of bone density and structure, unspecified thigh: Secondary | ICD-10-CM

## 2020-12-10 DIAGNOSIS — Z7952 Long term (current) use of systemic steroids: Secondary | ICD-10-CM | POA: Diagnosis not present

## 2020-12-10 DIAGNOSIS — Z79811 Long term (current) use of aromatase inhibitors: Secondary | ICD-10-CM

## 2020-12-10 LAB — CBC WITH DIFFERENTIAL/PLATELET
Abs Immature Granulocytes: 0.02 10*3/uL (ref 0.00–0.07)
Basophils Absolute: 0.1 10*3/uL (ref 0.0–0.1)
Basophils Relative: 1 %
Eosinophils Absolute: 0.3 10*3/uL (ref 0.0–0.5)
Eosinophils Relative: 3 %
HCT: 34.7 % — ABNORMAL LOW (ref 36.0–46.0)
Hemoglobin: 11.2 g/dL — ABNORMAL LOW (ref 12.0–15.0)
Immature Granulocytes: 0 %
Lymphocytes Relative: 59 %
Lymphs Abs: 5 10*3/uL — ABNORMAL HIGH (ref 0.7–4.0)
MCH: 31.6 pg (ref 26.0–34.0)
MCHC: 32.3 g/dL (ref 30.0–36.0)
MCV: 98 fL (ref 80.0–100.0)
Monocytes Absolute: 0.6 10*3/uL (ref 0.1–1.0)
Monocytes Relative: 7 %
Neutro Abs: 2.6 10*3/uL (ref 1.7–7.7)
Neutrophils Relative %: 30 %
Platelets: 225 10*3/uL (ref 150–400)
RBC: 3.54 MIL/uL — ABNORMAL LOW (ref 3.87–5.11)
RDW: 13.5 % (ref 11.5–15.5)
WBC: 8.5 10*3/uL (ref 4.0–10.5)
nRBC: 0 % (ref 0.0–0.2)

## 2020-12-10 LAB — COMPREHENSIVE METABOLIC PANEL
ALT: 11 U/L (ref 0–44)
AST: 24 U/L (ref 15–41)
Albumin: 3.2 g/dL — ABNORMAL LOW (ref 3.5–5.0)
Alkaline Phosphatase: 51 U/L (ref 38–126)
Anion gap: 7 (ref 5–15)
BUN: 8 mg/dL (ref 8–23)
CO2: 29 mmol/L (ref 22–32)
Calcium: 8.6 mg/dL — ABNORMAL LOW (ref 8.9–10.3)
Chloride: 105 mmol/L (ref 98–111)
Creatinine, Ser: 0.54 mg/dL (ref 0.44–1.00)
GFR, Estimated: >60 mL/min (ref >60)
Glucose, Bld: 106 mg/dL — ABNORMAL HIGH (ref 70–99)
Potassium: 3.5 mmol/L (ref 3.5–5.1)
Sodium: 141 mmol/L (ref 135–145)
Total Bilirubin: 0.7 mg/dL (ref 0.3–1.2)
Total Protein: 6.5 g/dL (ref 6.5–8.1)

## 2020-12-10 MED ORDER — DENOSUMAB 60 MG/ML ~~LOC~~ SOSY
60.0000 mg | PREFILLED_SYRINGE | Freq: Once | SUBCUTANEOUS | Status: AC
  Administered 2020-12-10: 60 mg via SUBCUTANEOUS
  Filled 2020-12-10: qty 1

## 2020-12-10 NOTE — Assessment & Plan Note (Addendum)
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  Clinically stable.  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   # DISPO: # Prolia today # follow up in 6 months- MD; labs- cbc/cmp/Prolia;  Dr.B

## 2020-12-10 NOTE — Progress Notes (Signed)
[facility] OFFICE PROGRESS NOTE  Patient Care Team: Leim Fabry, MD as PCP - General (Family Medicine) Lemar Livings, Merrily Pew, MD (General Surgery) Leim Fabry, MD as Referring Physician [facility])  Cancer Staging No matching staging information was found for the patient.   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   # AUG 2012-? Mild-Leucocytosis/lymphocytosis [? 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   #August 2021-EGD colonoscopy CT scan [weight loss]-negative  # Post Polio neuropathy/wheel chair   # FEB 2019- T-score of -2.3 [osteopenia] AUG 2019- START PROLIA q 82M ---------------------------------------------  DIAGNOSIS: LOBULAR BREAST CA  STAGE:  II       ;GOALS: CURE  CURRENT/MOST RECENT THERAPY: Arimdex [sep 2019]    Malignant neoplasm of overlapping sites of right breast in female, estrogen receptor positive (HCC)  11/24/2019 Genetic Testing   BARD1 c.166A>G VUS identified on the common hereditary cancer panel.  The Common Hereditary Gene Panel offered by [facility] includes sequencing and/or deletion duplication testing of the following 48 genes: APC, ATM, AXIN2, BARD1, BMPR1A, BRCA1, BRCA2, BRIP1, CDH1, CDK4, CDKN2A (p14ARF), CDKN2A (p16INK4a), CHEK2, CTNNA1, DICER1, EPCAM (Deletion/duplication testing only), GREM1 (promoter region deletion/duplication testing only), KIT, MEN1, MLH1, MSH2, MSH3, MSH6, MUTYH, NBN, NF1, NHTL1, PALB2, PDGFRA, PMS2, POLD1, POLE, PTEN, RAD50, RAD51C, RAD51D, RNF43, SDHB, SDHC, SDHD, SMAD4, SMARCA4. STK11, TP53, TSC1, TSC2, and VHL.  The following genes were evaluated for  sequence changes only: SDHA and HOXB13 c.251G>A variant only. The report date is November 24, 2019.       INTERVAL HISTORY:  Erika Miller 75 y.o.  female pleasant patient above history of stage II lobular breast cancer currently on on surveillance; mild lymphocytosis/recent weight loss; osteopenia on Prolia is here for follow-up/review results of bone density.  Patient denies any nausea vomiting abdominal pain.  No new lumps or bumps.  Chronic mild fatigue.  No falls.   Review of Systems  Constitutional: Positive for malaise/fatigue and weight loss. Negative for chills, diaphoresis and fever.  HENT: Negative for nosebleeds and sore throat.   Eyes: Negative for double vision.  Respiratory: Negative for cough, hemoptysis, sputum production, shortness of breath and wheezing.   Cardiovascular: Negative for chest pain, palpitations, orthopnea and leg swelling.  Gastrointestinal: Negative for abdominal pain, blood in stool, constipation, diarrhea, heartburn, melena, nausea and vomiting.  Genitourinary: Negative for dysuria, frequency and urgency.  Musculoskeletal: Negative for back pain and joint pain.  Skin: Negative.  Negative for itching and rash.  Neurological: Negative for dizziness, tingling, weakness and headaches. 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  Endo/Heme/Allergies: Does not bruise/bleed easily.  Psychiatric/Behavioral: Negative for depression. The patient is not nervous/anxious and does not have insomnia.      PAST MEDICAL HISTORY :  Past Medical History:  Diagnosis Date  . Arthritis    hands,knees,shoulders  . Breast cancer, right breast (HCC) 09/2013   Right Breast, pT2,N0 (l+sn)  . Dysrhythmia    heart skipped beat per patient, checked by Dr Darrold Junker  . Family history of breast cancer   . Family history of ovarian cancer   . GERD (gastroesophageal reflux disease)   . Hyperlipidemia   .  Leukocytosis   . Lymphocytosis     low-grade clonal T-cell disorder  . Malignant neoplasm of lower-inner quadrant of female breast [facility])    right, s/p chemotherapy  . 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 Osteopenia   . Osteoporosis   . Personal history of chemotherapy   . 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 PONV (postoperative nausea and vomiting) 1 time   after hysterectomy  . Reflux   . Scoliosis   . Seizures (HCC) x3   [facility] could find nothing wrong.Dec 2015  . Transfusion history     PAST SURGICAL HISTORY :   Past Surgical History:  Procedure Laterality Date  . ABDOMINAL HYSTERECTOMY  08/10/83   ovaries were not removed  . BACK SURGERY     x 2  . BREAST BIOPSY Right 2008, 2013   2008 negative, 2013 positive  . BREAST BIOPSY Left 04-24-14   Calcifications associated with stroma and sclerosing adenosis  . BREAST SURGERY Right 10/2012   Mastectomy with s/n bx, and radiation therapy  . CATARACT EXTRACTION W/PHACO Right 02/20/2015   Procedure: CATARACT EXTRACTION PHACO AND INTRAOCULAR LENS PLACEMENT (IOC);  Surgeon: Lockie Mola, MD;  Location: [facility];  Service: Ophthalmology;  Laterality: Right;  . CATARACT EXTRACTION W/PHACO Left 01/10/2020   Procedure: CATARACT EXTRACTION PHACO AND INTRAOCULAR LENS PLACEMENT (IOC) LEFT;  Surgeon: Lockie Mola, MD;  Location: [facility];  Service: Ophthalmology;  Laterality: Left;  9.13 0:59.7 15.3%  . COLONOSCOPY  2011   Dr. Bluford Kaufmann, 2 benign polyps removed  . COLONOSCOPY WITH PROPOFOL N/A 08/12/2015   Procedure: COLONOSCOPY WITH PROPOFOL;  Surgeon: Wallace Cullens, MD;  Location: [facility] END[facility];  Service: Gastroenterology;  Laterality: N/A;  . COLONOSCOPY WITH PROPOFOL N/A 10/30/2019   Procedure: COLONOSCOPY WITH PROPOFOL;  Surgeon: Toledo, Boykin Nearing, MD;  Location: [facility] ENDOSCOPY;  Service: Gastroenterology;  Laterality: N/A;  . ESOPHAGOGASTRODUODENOSCOPY (EGD)  WITH PROPOFOL N/A 08/12/2015   Procedure: ESOPHAGOGASTRODUODENOSCOPY (EGD) WITH PROPOFOL;  Surgeon: Wallace Cullens, MD;  Location: [facility] ENDOSCOPY;  Service: Gastroenterology;  Laterality: N/A;  . ESOPHAGOGASTRODUODENOSCOPY (EGD) WITH PROPOFOL N/A 10/30/2019   Procedure: ESOPHAGOGASTRODUODENOSCOPY (EGD) WITH PROPOFOL;  Surgeon: Toledo, Boykin Nearing, MD;  Location: [facility] ENDOSCOPY;  Service: Gastroenterology;  Laterality: N/A;  . FOOT SURGERY Right 1988  . LEG SURGERY Bilateral 1957, 1958  . MASTECTOMY Right   . PORTACATH PLACEMENT  10/2012   recently removed  . SPINE SURGERY  1959, 1960   . TUBAL LIGATION      FAMILY HISTORY :   Family History  Problem Relation Age of Onset  . Breast cancer Sister 21  . Ovarian cancer Sister   . Lung cancer Other   . Dementia Mother   . Lung cancer Father   . Dementia Maternal Uncle   . Diabetes Paternal Uncle   . Cancer Maternal Grandmother        "female cancer"  . Heart attack Maternal Uncle     SOCIAL HISTORY:   Social History   Tobacco Use  . Smoking status: Former Smoker    Packs/day: 1.00    Years: 35.00    Pack years: 35.00    Types: Cigarettes    Quit date: 10/20/2003    Years since quitting: 17.1  . Smokeless tobacco: Never Used  . Tobacco comment: Smoking History stopped smoking in 2005. smoked 1 pk/per day x 32 yrs  Vaping  Use  . Vaping Use: Never used  Substance Use Topics  . Alcohol use: No    Alcohol/week: 0.0 standard drinks  . Drug use: No    ALLERGIES:  is allergic to augmentin [amoxicillin-pot clavulanate] and macrobid [nitrofurantoin macrocrystal].  MEDICATIONS:  Current Outpatient Medications  Medication Sig Dispense Refill  . atorvastatin (LIPITOR) 20 MG tablet Take 20 mg by mouth daily.    . Calcium Carbonate (CALCIUM 600 PO) Take 1 tablet by mouth daily. Dinner    . cetirizine (ZYRTEC) 10 MG tablet Take 10 mg by mouth daily. AM    . Cholecalciferol (VITAMIN D3) 2000 UNITS TABS Take 1 tablet by mouth daily. Dinner     . denosumab (PROLIA) 60 MG/ML SOSY injection Inject 60 mg into the skin every 6 (six) months.    . famotidine (PEPCID) 20 MG tablet Take 20 mg by mouth daily. Am    . gabapentin (NEURONTIN) 300 MG capsule Take 1 capsule (300 mg total) by mouth 3 (three) times daily. 90 capsule 3  . Multiple Vitamin (MULTIVITAMIN) tablet Take 1 tablet by mouth daily. Dinner    . neomycin-polymyxin b-dexamethasone (MAXITROL) 3.5-10000-0.1 OINT Place 1 application into the left eye at bedtime.  0  . nicotine polacrilex (NICORETTE) 2 MG gum Take 2 mg by mouth as needed for smoking cessation.    . prednisoLONE acetate (PRED FORTE) 1 % ophthalmic suspension Place 1 drop into the left eye daily.  0   No current facility-administered medications for this visit.    PHYSICAL EXAMINATION: ECOG PERFORMANCE STATUS: 2 - Symptomatic, <50% confined to bed  BP 104/65   Pulse 80   Temp (!) 95.9 F (35.5 C) (Tympanic)   Resp 20   Ht 5' (1.524 m)   Wt 114 lb (51.7 kg)   BMI 22.26 kg/m   Filed Weights   12/10/20 1319  Weight: 114 lb (51.7 kg)    Physical Exam Constitutional:      Comments: In a wheelchair.  Chronic/polio.  Alone.   Thin built; frail appearing.  HENT:     Head: Normocephalic and atraumatic.     Mouth/Throat:     Pharynx: No oropharyngeal exudate.  Eyes:     Pupils: Pupils are equal, round, and reactive to light.  Cardiovascular:     Rate and Rhythm: Normal rate and regular rhythm.  Pulmonary:     Effort: Pulmonary effort is normal. No respiratory distress.     Breath sounds: Normal breath sounds. No wheezing.  Abdominal:     General: Bowel sounds are normal. There is no distension.     Palpations: Abdomen is soft. There is no mass.     Tenderness: There is no abdominal tenderness. There is no guarding or rebound.  Musculoskeletal:        General: No tenderness. Normal range of motion.     Cervical back: Normal range of motion and neck supple.  Skin:    General: Skin is warm.   Neurological:     Mental Status: She is alert and oriented to person, place, and time.     Comments: Chronic left lower extremity weakness muscle atrophy-secondary to polio.  Psychiatric:        Mood and Affect: Affect normal.   ;    LABORATORY DATA:  I have reviewed the data as listed    Component Value Date/Time   NA 141 12/10/2020 1305   NA 140 05/15/2013 1038   K 3.5 12/10/2020 1305   K 3.6  05/15/2013 1038   CL 105 12/10/2020 1305   CL 106 05/15/2013 1038   CO2 29 12/10/2020 1305   CO2 29 05/15/2013 1038   GLUCOSE 106 (H) 12/10/2020 1305   GLUCOSE 95 05/15/2013 1038   BUN 8 12/10/2020 1305   BUN 7 05/15/2013 1038   CREATININE 0.54 12/10/2020 1305   CREATININE 0.63 05/28/2014 1028   CALCIUM 8.6 (L) 12/10/2020 1305   CALCIUM 9.2 05/15/2013 1038   PROT 6.5 12/10/2020 1305   PROT 6.8 05/28/2014 1028   ALBUMIN 3.2 (L) 12/10/2020 1305   ALBUMIN 3.1 (L) 05/28/2014 1028   AST 24 12/10/2020 1305   AST 22 05/28/2014 1028   ALT 11 12/10/2020 1305   ALT 21 05/28/2014 1028   ALKPHOS 51 12/10/2020 1305   ALKPHOS 98 05/28/2014 1028   BILITOT 0.7 12/10/2020 1305   BILITOT 0.4 05/28/2014 1028   GFRNONAA >60 12/10/2020 1305   GFRNONAA >60 05/28/2014 1028   GFRAA >60 06/11/2020 1305   GFRAA >60 05/28/2014 1028    No results found for: SPEP, UPEP  Lab Results  Component Value Date   WBC 8.5 12/10/2020   NEUTROABS 2.6 12/10/2020   HGB 11.2 (L) 12/10/2020   HCT 34.7 (L) 12/10/2020   MCV 98.0 12/10/2020   PLT 225 12/10/2020      Chemistry      Component Value Date/Time   NA 141 12/10/2020 1305   NA 140 05/15/2013 1038   K 3.5 12/10/2020 1305   K 3.6 05/15/2013 1038   CL 105 12/10/2020 1305   CL 106 05/15/2013 1038   CO2 29 12/10/2020 1305   CO2 29 05/15/2013 1038   BUN 8 12/10/2020 1305   BUN 7 05/15/2013 1038   CREATININE 0.54 12/10/2020 1305   CREATININE 0.63 05/28/2014 1028      Component Value Date/Time   CALCIUM 8.6 (L) 12/10/2020 1305   CALCIUM 9.2  05/15/2013 1038   ALKPHOS 51 12/10/2020 1305   ALKPHOS 98 05/28/2014 1028   AST 24 12/10/2020 1305   AST 22 05/28/2014 1028   ALT 11 12/10/2020 1305   ALT 21 05/28/2014 1028   BILITOT 0.7 12/10/2020 1305   BILITOT 0.4 05/28/2014 1028       RADIOGRAPHIC STUDIES: I have personally reviewed the radiological images as listed and agreed with the findings in the report. DG Bone Density  Result Date: 12/09/2020 EXAM: DUAL X-RAY ABSORPTIOMETRY (DXA) FOR BONE MINERAL DENSITY IMPRESSION: Dear Dr Rogue Bussing, Your patient Erika Miller completed a BMD test on 12/09/2020 using the Yulee (software version: 14.10) manufactured by [employer]. The following summarizes the results of our evaluation. Technologist: MTB PATIENT BIOGRAPHICAL: Name: Erika Miller, Erika Miller Patient ID: [id number] Birth Date: 18-Nov-1945 Height: 60.0 in. Gender: Female Exam Date: 12/09/2020 Weight: 115.0 lbs. Indications: History of Chemo, History of Spinal Surgery, Breast CA, Family Hist. (Parent hip fracture), Postmenopausal, Height Loss, Advanced Age, Scoliosis, Caucasian, Hysterectomy Fractures: Treatments: Arimidex, Calcium, Gabapentin, Multi-Vitamin, Vitamin D DENSITOMETRY RESULTS: Site         Region     Measured Date Measured Age WHO Classification Young Adult T-score BMD         %Change vs. Previous Significant Change (*) DualFemur Total Left 12/09/2020 76.7 Osteopenia -2.4 0.711 g/cm2 -1.7% - DualFemur Total Left 12/07/2017 73.7 Osteopenia -2.3 0.723 g/cm2 -0.6% - DualFemur Total Left 10/01/2015 71.5 Osteopenia -2.2 0.727 g/cm2 -5.6% Yes DualFemur Total Left 04/04/2014 70.1 Osteopenia -1.9 0.770 g/cm2 -2.0% - DualFemur Total Left 07/01/2010  66.3 Osteopenia -1.8 0.786 g/cm2 - - DualFemur Total Mean 12/09/2020 76.7 Osteopenia -1.6 0.807 g/cm2 -0.2% - DualFemur Total Mean 12/07/2017 73.7 Osteopenia -1.6 0.809 g/cm2 -3.2% Yes DualFemur Total Mean 10/01/2015 71.5 Osteopenia -1.4 0.836 g/cm2 -7.9% Yes DualFemur Total  Mean 04/04/2014 70.1 Normal -0.8 0.908 g/cm2 0.4% - DualFemur Total Mean 07/01/2010 66.3 Normal -0.8 0.904 g/cm2 - - Left Forearm Radius 33% 12/09/2020 76.7 Osteopenia -1.4 0.757 g/cm2 -1.4% - Left Forearm Radius 33% 12/07/2017 73.7 Osteopenia -1.2 0.768 g/cm2 2.8% - Left Forearm Radius 33% 10/01/2015 71.5 Osteopenia -1.5 0.747 g/cm2 -9.7% Yes Left Forearm Radius 33% 04/04/2014 70.1 Normal -0.6 0.827 g/cm2 -0.1% - Left Forearm Radius 33% 07/01/2010 66.3 Normal -0.6 0.828 g/cm2 - - ASSESSMENT: The BMD measured at Femur Total Left is 0.711 g/cm2 with a T-score of -2.4. This patient is considered OSTEOPENIC according to World Health Organization [facility]) criteria. The scan quality is good. Lumbar spine was not utilized due to surgical hardware. Compared with prior study, there has been no significant change in the total hip. World Science writer [facility]) criteria for post-menopausal, Caucasian Women: Normal:                   T-score at or above -1 SD Osteopenia/low bone mass: T-score between -1 and -2.5 SD Osteoporosis:             T-score at or below -2.5 SD RECOMMENDATIONS: 1. All patients should optimize calcium and vitamin D intake. 2. Consider FDA-approved medical therapies in postmenopausal women and men aged 45 years and older, based on the following: a. A hip or vertebral(clinical or morphometric) fracture b. T-score < -2.5 at the femoral neck or spine after appropriate evaluation to exclude secondary causes c. Low bone mass (T-score between -1.0 and -2.5 at the femoral neck or spine) and a 10-year probability of a hip fracture > 3% or a 10-year probability of a major osteoporosis-related fracture > 20% based on the [place]-adapted WHO algorithm 3. Clinician judgment and/or patient preferences may indicate treatment for people with 10-year fracture probabilities above or below these levels FOLLOW-UP: People with diagnosed cases of osteoporosis or at high risk for fracture should have regular bone mineral density  tests. For patients eligible for Medicare, routine testing is allowed once every 2 years. The testing frequency can be increased to one year for patients who have rapidly progressing disease, those who are receiving or discontinuing medical therapy to restore bone mass, or have additional risk factors. I have reviewed this report, and agree with the above findings. Mark A. Tyron Russell, M.D. [facility] Your patient Erika Miller completed a FRAX assessment on 12/09/2020 using the Lunar iDXA DXA System (analysis version: 14.10) manufactured by [employer]. The following summarizes the results of our evaluation. PATIENT BIOGRAPHICAL: Name: Erika Miller, Erika Miller Patient ID: [id number] Birth Date: 05-27-1946 Height:    60.0 in. Gender:     [age]    [age]       Weight:    115.0 lbs. Ethnicity:  White                            Exam Date: 12/09/2020 FRAX* RESULTS:  (version: 3.5) 10-year Probability of Fracture1 Major Osteoporotic Fracture2 Hip Fracture 23.8% 14.5% Population: [place] (Caucasian) Risk Factors: Family Hist. (Parent hip fracture) Based on DualFemur (Left) Neck BMD 1 -The 10-year probability of fracture may be lower than reported if  the patient has received treatment. 2 -Major Osteoporotic Fracture: Clinical Spine, Forearm, Hip or Shoulder *FRAX is a Materials engineer of the [employer] for Metabolic Bone Disease, a [place] (WHO) [facility]. ASSESSMENT: The probability of a major osteoporotic fracture is 23.8% within the next ten years. The probability of a hip fracture is 14.5% within the next ten years. I have reviewed this report and agree with the above findings. Mark A. Thornton Papas, M.D. [facility] Electronically Signed   By: Lavonia Dana M.D.   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  Clinically stable.  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   # DISPO: # Prolia today # follow up in 6 months- MD; labs- cbc/cmp/Prolia;  Dr.B    Orders Placed This Encounter  Procedures  . CBC with Differential    Standing Status:   Standing    Number of Occurrences:   2    Standing Expiration Date:   12/10/2021  . Comprehensive metabolic panel    Standing Status:   Standing    Number of Occurrences:   2    Standing Expiration Date:   12/10/2021   All questions were answered. The patient knows to call the clinic with any problems, questions or concerns.      Cammie Sickle, MD 12/10/2020 2:17 PM

## 2021-01-08 ENCOUNTER — Other Ambulatory Visit: Payer: Self-pay | Admitting: Family Medicine

## 2021-01-08 DIAGNOSIS — Z1231 Encounter for screening mammogram for malignant neoplasm of breast: Secondary | ICD-10-CM

## 2021-03-06 ENCOUNTER — Other Ambulatory Visit: Payer: Self-pay | Admitting: General Surgery

## 2021-03-06 DIAGNOSIS — Z1239 Encounter for other screening for malignant neoplasm of breast: Secondary | ICD-10-CM

## 2021-04-17 ENCOUNTER — Ambulatory Visit
Admission: RE | Admit: 2021-04-17 | Discharge: 2021-04-17 | Disposition: A | Discharge status: Home / Self Care | Payer: Medicare HMO | Source: Ambulatory Visit | Attending: General Surgery | Admitting: General Surgery

## 2021-04-17 ENCOUNTER — Other Ambulatory Visit: Payer: Self-pay

## 2021-04-17 DIAGNOSIS — Z1231 Encounter for screening mammogram for malignant neoplasm of breast: Secondary | ICD-10-CM | POA: Diagnosis not present

## 2021-04-17 DIAGNOSIS — Z1239 Encounter for other screening for malignant neoplasm of breast: Secondary | ICD-10-CM

## 2021-06-10 ENCOUNTER — Inpatient Hospital Stay (HOSPITAL_BASED_OUTPATIENT_CLINIC_OR_DEPARTMENT_OTHER): Payer: Medicare HMO | Admitting: Internal Medicine

## 2021-06-10 ENCOUNTER — Encounter: Payer: Self-pay | Admitting: Internal Medicine

## 2021-06-10 ENCOUNTER — Inpatient Hospital Stay: Payer: Medicare HMO | Attending: Internal Medicine

## 2021-06-10 ENCOUNTER — Ambulatory Visit: Payer: Medicare HMO

## 2021-06-10 ENCOUNTER — Other Ambulatory Visit: Payer: Self-pay

## 2021-06-10 ENCOUNTER — Inpatient Hospital Stay: Payer: Medicare HMO

## 2021-06-10 ENCOUNTER — Other Ambulatory Visit: Payer: Medicare HMO

## 2021-06-10 ENCOUNTER — Ambulatory Visit: Payer: Medicare HMO | Admitting: Internal Medicine

## 2021-06-10 DIAGNOSIS — Z79811 Long term (current) use of aromatase inhibitors: Secondary | ICD-10-CM

## 2021-06-10 DIAGNOSIS — Z170 Estrogen receptor positive status [ER+]: Secondary | ICD-10-CM | POA: Diagnosis not present

## 2021-06-10 DIAGNOSIS — Z853 Personal history of malignant neoplasm of breast: Secondary | ICD-10-CM | POA: Diagnosis not present

## 2021-06-10 DIAGNOSIS — M8580 Other specified disorders of bone density and structure, unspecified site: Secondary | ICD-10-CM | POA: Diagnosis not present

## 2021-06-10 DIAGNOSIS — D72820 Lymphocytosis (symptomatic): Secondary | ICD-10-CM | POA: Diagnosis not present

## 2021-06-10 DIAGNOSIS — Z79899 Other long term (current) drug therapy: Secondary | ICD-10-CM | POA: Diagnosis not present

## 2021-06-10 DIAGNOSIS — F17210 Nicotine dependence, cigarettes, uncomplicated: Secondary | ICD-10-CM | POA: Diagnosis not present

## 2021-06-10 DIAGNOSIS — M85859 Other specified disorders of bone density and structure, unspecified thigh: Secondary | ICD-10-CM

## 2021-06-10 LAB — COMPREHENSIVE METABOLIC PANEL
ALT: 11 U/L (ref 0–44)
AST: 22 U/L (ref 15–41)
Albumin: 3.1 g/dL — ABNORMAL LOW (ref 3.5–5.0)
Alkaline Phosphatase: 47 U/L (ref 38–126)
Anion gap: 6 (ref 5–15)
BUN: 10 mg/dL (ref 8–23)
CO2: 28 mmol/L (ref 22–32)
Calcium: 8.2 mg/dL — ABNORMAL LOW (ref 8.9–10.3)
Chloride: 106 mmol/L (ref 98–111)
Creatinine, Ser: 0.6 mg/dL (ref 0.44–1.00)
GFR, Estimated: >60 mL/min (ref >60)
Glucose, Bld: 110 mg/dL — ABNORMAL HIGH (ref 70–99)
Potassium: 3.3 mmol/L — ABNORMAL LOW (ref 3.5–5.1)
Sodium: 140 mmol/L (ref 135–145)
Total Bilirubin: 0.7 mg/dL (ref 0.3–1.2)
Total Protein: 6.3 g/dL — ABNORMAL LOW (ref 6.5–8.1)

## 2021-06-10 LAB — CBC WITH DIFFERENTIAL/PLATELET
Abs Immature Granulocytes: 0.03 10*3/uL (ref 0.00–0.07)
Basophils Absolute: 0.1 10*3/uL (ref 0.0–0.1)
Basophils Relative: 1 %
Eosinophils Absolute: 0.3 10*3/uL (ref 0.0–0.5)
Eosinophils Relative: 4 %
HCT: 34.5 % — ABNORMAL LOW (ref 36.0–46.0)
Hemoglobin: 11.3 g/dL — ABNORMAL LOW (ref 12.0–15.0)
Immature Granulocytes: 0 %
Lymphocytes Relative: 54 %
Lymphs Abs: 5.1 10*3/uL — ABNORMAL HIGH (ref 0.7–4.0)
MCH: 31 pg (ref 26.0–34.0)
MCHC: 32.8 g/dL (ref 30.0–36.0)
MCV: 94.5 fL (ref 80.0–100.0)
Monocytes Absolute: 0.7 10*3/uL (ref 0.1–1.0)
Monocytes Relative: 7 %
Neutro Abs: 3.2 10*3/uL (ref 1.7–7.7)
Neutrophils Relative %: 34 %
Platelets: 242 10*3/uL (ref 150–400)
RBC: 3.65 MIL/uL — ABNORMAL LOW (ref 3.87–5.11)
RDW: 13.7 % (ref 11.5–15.5)
WBC: 9.4 10*3/uL (ref 4.0–10.5)
nRBC: 0 % (ref 0.0–0.2)

## 2021-06-10 MED ORDER — DENOSUMAB 60 MG/ML ~~LOC~~ SOSY
60.0000 mg | PREFILLED_SYRINGE | Freq: Once | SUBCUTANEOUS | Status: AC
  Administered 2021-06-10: 60 mg via SUBCUTANEOUS

## 2021-06-10 NOTE — Assessment & Plan Note (Addendum)
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  STABLE.   # Chronic mild anemia: Hemoglobin around 11.  Stable.  Monitor for now..  # STAGE II  RIGHT BREAST CA/Lobular cancer-[s/p adjuvant Arimidex-06/2018];july 2022-mammogram reviewed- STABLE.   # BMD- osteopenia- FEB 2022 -2.4; STABLE; -Mild hypocal 8.2 [corrected 8.8 for albumin]  continue calcium plus vitamin D twice a day; ok with Prolia.   # DISPO: # Prolia today # follow up in 6 months- MD; labs- cbc/cmp/Prolia;  Dr.B

## 2021-06-10 NOTE — Progress Notes (Signed)
Last last [facility] [place] OFFICE PROGRESS NOTE  Patient Care Team: Gayland Curry, MD as PCP - General (Family Medicine) Bary Castilla, Forest Gleason, MD (General Surgery) Gayland Curry, MD as Referring Physician [facility] Medicine)  Cancer Staging No matching staging information was found for the patient.   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   # AUG 2012-? Mild-Leucocytosis/lymphocytosis [? 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   #August 2021-EGD colonoscopy CT scan [weight loss]-negative  # Post Polio neuropathy/wheel chair   # FEB 2019- T-score of -2.3 [osteopenia] AUG 2019- START PROLIA q 25M ---------------------------------------------  DIAGNOSIS: LOBULAR BREAST CA  STAGE:  II       ;GOALS: CURE  CURRENT/MOST RECENT THERAPY: Arimdex [sep 7253]    Malignant neoplasm of overlapping sites of right breast in female, estrogen receptor positive ([place])  11/24/2019 Genetic Testing   BARD1 c.166A>G VUS identified on the common hereditary cancer panel.  The Common Hereditary Gene Panel offered by [facility] includes sequencing and/or deletion duplication testing of the following 48 genes: APC, ATM, AXIN2, BARD1, BMPR1A, BRCA1, BRCA2, BRIP1, CDH1, CDK4, CDKN2A (p14ARF), CDKN2A (p16INK4a), CHEK2, CTNNA1, DICER1, EPCAM (Deletion/duplication testing only), GREM1 (promoter region deletion/duplication testing only), KIT, MEN1, MLH1, MSH2, MSH3, MSH6, MUTYH, NBN, NF1, NHTL1, PALB2, PDGFRA, PMS2, POLD1, POLE, PTEN, RAD50, RAD51C, RAD51D, RNF43, SDHB, SDHC, SDHD, SMAD4, SMARCA4. STK11, TP53, TSC1, TSC2, and VHL.  The following genes were evaluated  for sequence changes only: SDHA and HOXB13 c.251G>A variant only. The report date is November 24, 2019.       INTERVAL HISTORY:  Erika Miller 75 y.o.  female pleasant patient above history of stage II lobular breast cancer currently on on surveillance; mild lymphocytosis/recent weight loss; osteopenia on Prolia is here for follow-up/proceed with prolia.   Denies any swelling in the legs.  Denies any nausea vomiting.  No lumps or bumps.  No fevers or chills.  Chronic mild fatigue.  Review of Systems  Constitutional:  Positive for malaise/fatigue. Negative for chills, diaphoresis and fever.  HENT:  Negative for nosebleeds and sore throat.   Eyes:  Negative for double vision.  Respiratory:  Negative for cough, hemoptysis, sputum production, shortness of breath and wheezing.   Cardiovascular:  Negative for chest pain, palpitations, orthopnea and leg swelling.  Gastrointestinal:  Negative for abdominal pain, blood in stool, constipation, diarrhea, heartburn, melena, nausea and vomiting.  Genitourinary:  Negative for dysuria, frequency and urgency.  Musculoskeletal:  Negative for back pain and joint pain.  Skin: Negative.  Negative for itching and rash.  Neurological:  Negative for dizziness, tingling, weakness and headaches. 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 Endo/Heme/Allergies:  Does not bruise/bleed easily.  Psychiatric/Behavioral:  Negative for depression. The patient is not nervous/anxious and does not have insomnia.     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)    Osteopenia    Osteoporosis    Personal history of chemotherapy    Polio    Has been in a wheelchair last 10-12 years from weakness and instability   PONV (postoperative nausea and vomiting) 1 time   after hysterectomy   Reflux    Scoliosis    Seizures ([place]) x3   [facility] could find nothing wrong.Dec 2015   Transfusion history     PAST SURGICAL HISTORY :   Past Surgical History:  Procedure Laterality Date   ABDOMINAL HYSTERECTOMY  08/10/83   ovaries were not removed   BACK SURGERY     x 2   BREAST BIOPSY Right 2008, 2013   2008 negative, 2013 positive   BREAST BIOPSY Left 04-24-14   Calcifications associated with stroma and sclerosing adenosis   BREAST SURGERY Right 10/2012   Mastectomy with s/n bx, and radiation therapy   CATARACT EXTRACTION W/PHACO Right 02/20/2015   Procedure: CATARACT EXTRACTION PHACO AND INTRAOCULAR LENS PLACEMENT ([place]);  Surgeon: Leandrew Koyanagi, MD;  Location: [place];  Service: Ophthalmology;  Laterality: Right;   CATARACT EXTRACTION W/PHACO Left 01/10/2020   Procedure: CATARACT EXTRACTION PHACO AND INTRAOCULAR LENS PLACEMENT ([place]) LEFT;  Surgeon: Leandrew Koyanagi, MD;  Location: [place];  Service: Ophthalmology;  Laterality: Left;  9.13 0:59.7 15.3%   COLONOSCOPY  2011   Dr. Candace Cruise, 2 benign polyps removed   COLONOSCOPY WITH PROPOFOL N/A 08/12/2015   Procedure: COLONOSCOPY WITH PROPOFOL;  Surgeon: Hulen Luster, MD;  Location: [facility] ENDOSCOPY;  Service: Gastroenterology;  Laterality: N/A;   COLONOSCOPY WITH PROPOFOL N/A 10/30/2019   Procedure: COLONOSCOPY WITH PROPOFOL;  Surgeon: Toledo, Benay Pike, MD;  Location: [facility] ENDOSCOPY;  Service: Gastroenterology;  Laterality: N/A;   ESOPHAGOGASTRODUODENOSCOPY (EGD) WITH PROPOFOL N/A 08/12/2015    Procedure: ESOPHAGOGASTRODUODENOSCOPY (EGD) WITH PROPOFOL;  Surgeon: Hulen Luster, MD;  [facility]COPY;  Service: Gastroenterology;  Laterality: N/A;   ESOPHAGOGASTRODUODENOSCOPY (EGD) WITH PROPOFOL N/A 10/30/2019   Procedure: ESOPHAGOGASTRODUODENOSCOPY (EGD) WITH PROPOFOL;  Surgeon: Toledo, Benay Pike, MD;  Location: [facility] ENDOSCOPY;  Service: Gastroenterology;  Laterality: N/A;   FOOT SURGERY Right 1988   LEG SURGERY Bilateral 1957, 1958   MASTECTOMY Right    PORTACATH PLACEMENT  10/2012   recently removed   [place] HISTORY :   Family History  Problem Relation Age of Onset   Breast cancer Sister 78   Ovarian cancer Sister    Lung cancer Other    Dementia Mother    Lung cancer Father    Dementia Maternal Uncle    Diabetes Paternal Uncle    Cancer Maternal Grandmother        "female cancer"   Heart attack Maternal Uncle     SOCIAL HISTORY:   Social History   Tobacco Use   Smoking status: Former    Packs/day: 1.00    Years: 35.00    Pack years: 35.00    Types: Cigarettes    Quit date: 10/20/2003    Years since quitting: 17.6   Smokeless tobacco: Never   Tobacco comments:    Smoking History stopped  smoking in 2005. smoked 1 pk/per day x 32 yrs  Vaping Use   Vaping Use: Never used  Substance Use Topics   Alcohol use: No    Alcohol/week: 0.0 standard drinks   Drug use: No    ALLERGIES:  is allergic to augmentin [amoxicillin-pot clavulanate] and macrobid [nitrofurantoin macrocrystal].  MEDICATIONS:  Current Outpatient Medications  Medication Sig Dispense Refill   atorvastatin (LIPITOR) 20 MG tablet Take 20 mg by mouth daily.     Calcium Carbonate (CALCIUM 600 PO) Take 1 tablet by mouth daily. Dinner     cetirizine (ZYRTEC) 10 MG tablet Take 10 mg by mouth daily. AM     Cholecalciferol (VITAMIN D3) 2000 UNITS TABS Take 1 tablet by mouth daily. Dinner     denosumab (PROLIA) 60 MG/ML SOSY injection Inject 60 mg into  the skin every 6 (six) months.     diclofenac Sodium (VOLTAREN) 1 % GEL Apply topically.     gabapentin (NEURONTIN) 300 MG capsule Take 1 capsule (300 mg total) by mouth 3 (three) times daily. 90 capsule 3   Multiple Vitamin (MULTIVITAMIN) tablet Take 1 tablet by mouth daily. Dinner     prednisoLONE acetate (PRED FORTE) 1 % ophthalmic suspension Place 1 drop into the left eye daily.  0   aspirin 81 MG EC tablet Take by mouth. (Patient not taking: Reported on 06/10/2021)     esomeprazole (NEXIUM) 20 MG capsule Take by mouth. (Patient not taking: Reported on 06/10/2021)     famotidine (PEPCID) 20 MG tablet Take 20 mg by mouth daily. Am (Patient not taking: Reported on 06/10/2021)     neomycin-polymyxin b-dexamethasone (MAXITROL) 3.5-10000-0.1 OINT Place 1 application into the left eye at bedtime. (Patient not taking: Reported on 06/10/2021)  0   nicotine polacrilex (NICORETTE) 2 MG gum Take 2 mg by mouth as needed for smoking cessation. (Patient not taking: Reported on 06/10/2021)     No current facility-administered medications for this visit.    PHYSICAL EXAMINATION: ECOG PERFORMANCE STATUS: 2 - Symptomatic, <50% confined to bed  BP 121/63   Pulse 99   Temp (!) 96.4 F (35.8 C)   Resp 14   Wt 113 lb 6.8 oz (51.5 kg)   SpO2 100%   BMI 22.15 kg/m   Filed Weights   06/10/21 1325  Weight: 113 lb 6.8 oz (51.5 kg)    Physical Exam Constitutional:      Comments: In a wheelchair.  Chronic/polio.  Alone.   Thin built; frail appearing.  HENT:     Head: Normocephalic and atraumatic.     Mouth/Throat:     Pharynx: No oropharyngeal exudate.  Eyes:     Pupils: Pupils are equal, round, and reactive to light.  Cardiovascular:     Rate and Rhythm: Normal rate and regular rhythm.  Pulmonary:     Effort: Pulmonary effort is normal. No respiratory distress.     Breath sounds: Normal breath sounds. No wheezing.  Abdominal:     General: Bowel sounds are normal. There is no distension.      Palpations: Abdomen is soft. There is no mass.     Tenderness: There is no abdominal tenderness. There is no guarding or rebound.  Musculoskeletal:        General: No tenderness. Normal range of motion.     Cervical back: Normal range of motion and neck supple.  Skin:    General: Skin is warm.  Neurological:     Mental Status: She is alert  and oriented to person, place, and time.     Comments: Chronic left lower extremity weakness muscle atrophy-secondary to polio.  Psychiatric:        Mood and Affect: Affect normal.  ;    LABORATORY DATA:  I have reviewed the data as listed    Component Value Date/Time   NA 140 06/10/2021 1256   NA 140 05/15/2013 1038   K 3.3 (L) 06/10/2021 1256   K 3.6 05/15/2013 1038   CL 106 06/10/2021 1256   CL 106 05/15/2013 1038   CO2 28 06/10/2021 1256   CO2 29 05/15/2013 1038   GLUCOSE 110 (H) 06/10/2021 1256   GLUCOSE 95 05/15/2013 1038   BUN 10 06/10/2021 1256   BUN 7 05/15/2013 1038   CREATININE 0.60 06/10/2021 1256   CREATININE 0.63 05/28/2014 1028   CALCIUM 8.2 (L) 06/10/2021 1256   CALCIUM 9.2 05/15/2013 1038   PROT 6.3 (L) 06/10/2021 1256   PROT 6.8 05/28/2014 1028   ALBUMIN 3.1 (L) 06/10/2021 1256   ALBUMIN 3.1 (L) 05/28/2014 1028   AST 22 06/10/2021 1256   AST 22 05/28/2014 1028   ALT 11 06/10/2021 1256   ALT 21 05/28/2014 1028   ALKPHOS 47 06/10/2021 1256   ALKPHOS 98 05/28/2014 1028   BILITOT 0.7 06/10/2021 1256   BILITOT 0.4 05/28/2014 1028   GFRNONAA >60 06/10/2021 1256   GFRNONAA >60 05/28/2014 1028   GFRAA >60 06/11/2020 1305   GFRAA >60 05/28/2014 1028    No results found for: SPEP, UPEP  Lab Results  Component Value Date   WBC 9.4 06/10/2021   NEUTROABS 3.2 06/10/2021   HGB 11.3 (L) 06/10/2021   HCT 34.5 (L) 06/10/2021   MCV 94.5 06/10/2021   PLT 242 06/10/2021      Chemistry      Component Value Date/Time   NA 140 06/10/2021 1256   NA 140 05/15/2013 1038   K 3.3 (L) 06/10/2021 1256   K 3.6 05/15/2013  1038   CL 106 06/10/2021 1256   CL 106 05/15/2013 1038   CO2 28 06/10/2021 1256   CO2 29 05/15/2013 1038   BUN 10 06/10/2021 1256   BUN 7 05/15/2013 1038   CREATININE 0.60 06/10/2021 1256   CREATININE 0.63 05/28/2014 1028      Component Value Date/Time   CALCIUM 8.2 (L) 06/10/2021 1256   CALCIUM 9.2 05/15/2013 1038   ALKPHOS 47 06/10/2021 1256   ALKPHOS 98 05/28/2014 1028   AST 22 06/10/2021 1256   AST 22 05/28/2014 1028   ALT 11 06/10/2021 1256   ALT 21 05/28/2014 1028   BILITOT 0.7 06/10/2021 1256   BILITOT 0.4 05/28/2014 1028       RADIOGRAPHIC STUDIES: I have personally reviewed the radiological images as listed and agreed with the findings in the report. No results found.    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  STABLE.   # Chronic mild anemia: Hemoglobin around 11.  Stable.  Monitor for now..  # STAGE II  RIGHT BREAST CA/Lobular cancer-[s/p adjuvant Arimidex-06/2018];july 2022-mammogram reviewed- STABLE.   # BMD- osteopenia- FEB 2022 -2.4; STABLE; -Mild hypocal 8.2 [corrected 8.8 for albumin]  continue calcium plus vitamin D twice a day; ok with Prolia.   # DISPO: # Prolia today # follow up in 6 months- MD; labs- cbc/cmp/Prolia;  Dr.B  Orders Placed This Encounter  Procedures   CBC with Differential/Platelet    Standing Status:   Future    Standing Expiration Date:   06/10/2022   Comprehensive metabolic panel    Standing Status:   Future    Standing Expiration Date:   06/10/2022   All questions were answered. The patient knows to call the clinic with any problems, questions or concerns.      Cammie Sickle, MD 06/10/2021 3:05 PM

## 2021-09-11 ENCOUNTER — Emergency Department
Admission: EM | Admit: 2021-09-11 | Discharge: 2021-09-11 | Disposition: A | Discharge status: Home / Self Care | Payer: Medicare HMO | Attending: Emergency Medicine | Admitting: Emergency Medicine

## 2021-09-11 ENCOUNTER — Other Ambulatory Visit: Payer: Self-pay

## 2021-09-11 ENCOUNTER — Encounter: Payer: Self-pay | Admitting: Internal Medicine

## 2021-09-11 ENCOUNTER — Emergency Department: Payer: Medicare HMO

## 2021-09-11 ENCOUNTER — Encounter: Payer: Self-pay | Admitting: Emergency Medicine

## 2021-09-11 DIAGNOSIS — X58XXXA Exposure to other specified factors, initial encounter: Secondary | ICD-10-CM | POA: Insufficient documentation

## 2021-09-11 DIAGNOSIS — Z87891 Personal history of nicotine dependence: Secondary | ICD-10-CM | POA: Diagnosis not present

## 2021-09-11 DIAGNOSIS — Z853 Personal history of malignant neoplasm of breast: Secondary | ICD-10-CM | POA: Insufficient documentation

## 2021-09-11 DIAGNOSIS — T18128A Food in esophagus causing other injury, initial encounter: Secondary | ICD-10-CM | POA: Insufficient documentation

## 2021-09-11 DIAGNOSIS — Z7982 Long term (current) use of aspirin: Secondary | ICD-10-CM | POA: Diagnosis not present

## 2021-09-11 MED ORDER — GLUCAGON HCL (RDNA) 1 MG IJ SOLR
1.0000 mg | Freq: Once | INTRAMUSCULAR | Status: AC
  Administered 2021-09-11: 1 mg via INTRAVENOUS
  Filled 2021-09-11 (×2): qty 1

## 2021-09-11 NOTE — ED Triage Notes (Signed)
Pt has history of getting food stuck in her throat and her esophagus needing stretched. She is not able to swallow her saliva and his gasping for air during trtiage.

## 2021-09-11 NOTE — ED Provider Notes (Signed)
[facility]  ____________________________________________   Event Date/Time   First MD Initiated Contact with Patient 09/11/21 1929     (approximate)  I have reviewed the triage vital signs and the nursing notes.   HISTORY  Chief Complaint Foreign Body    HPI Erika Miller is a 75 y.o. female past medical history of esophageal stenosis, GERD, breast cancer status postmastectomy, hyperlipidemia who presents with food bolus.  Patient was eating [place] about 3 hours ago when she felt it lodged in her midesophagus.  She initially felt it moved down but then it came up again.  Patient is sure that it is still there.  Has had this happen multiple times before and is usually able to pass on her own.  Has never had to come to the emergency department.  Has been told that she has a small esophagus and has to be very careful with chewing her food especially meat.  Patient does sometimes feel short of breath with these episodes but this usually resolves.  She tried having some tea and liquids to see if it would help the bolus past but these came right back up.         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)    Osteopenia    Osteoporosis    Personal history of chemotherapy    Polio    Has been in a wheelchair last 10-12 years from weakness and instability   PONV (postoperative nausea and vomiting) 1 time   after hysterectomy    Reflux    Scoliosis    Seizures ([place]) x3   [facility] could find nothing wrong.Dec 2015   Transfusion history     Patient Active Problem List   Diagnosis Date Noted   Genetic testing 11/24/2019   Family history of breast cancer    Family history of ovarian cancer    Aromatase inhibitor use 06/09/2018   Osteopenia of femoral neck 06/09/2018   Lymphocytosis 05/28/2017   Malignant neoplasm of overlapping sites of right breast in female, estrogen receptor positive ([place]) 09/07/2016   Microcalcifications of the breast 04/11/2014    Past Surgical History:  Procedure Laterality Date   ABDOMINAL HYSTERECTOMY  08/10/83   ovaries were not removed   BACK SURGERY     x 2   BREAST BIOPSY Right 2008, 2013   2008 negative, 2013 positive   BREAST BIOPSY Left 04-24-14   Calcifications associated with stroma and sclerosing adenosis   BREAST SURGERY Right 10/2012   Mastectomy with s/n bx, and radiation therapy   CATARACT EXTRACTION W/PHACO Right 02/20/2015   Procedure: CATARACT EXTRACTION PHACO AND INTRAOCULAR LENS PLACEMENT ([place]);  Surgeon: Leandrew Koyanagi, MD;  Location: [place];  Service: Ophthalmology;  Laterality: Right;   CATARACT EXTRACTION W/PHACO Left 01/10/2020   Procedure: CATARACT EXTRACTION PHACO AND INTRAOCULAR LENS PLACEMENT ([place]) LEFT;  Surgeon: Leandrew Koyanagi,  MD;  Location: [place];  Service: Ophthalmology;  Laterality: Left;  9.13 0:59.7 15.3%   COLONOSCOPY  2011   Dr. Candace Cruise, 2 benign polyps removed   COLONOSCOPY WITH PROPOFOL N/A 08/12/2015   Procedure: COLONOSCOPY WITH PROPOFOL;  Surgeon: Hulen Luster, MD;  Location: [facility] ENDOSCOPY;  Service: Gastroenterology;  Laterality: N/A;   COLONOSCOPY WITH PROPOFOL N/A 10/30/2019   Procedure: COLONOSCOPY WITH PROPOFOL;  Surgeon: Toledo, Benay Pike, MD;  Location: [facility] ENDOSCOPY;  Service: Gastroenterology;  Laterality: N/A;   ESOPHAGOGASTRODUODENOSCOPY (EGD) WITH PROPOFOL N/A 08/12/2015    Procedure: ESOPHAGOGASTRODUODENOSCOPY (EGD) WITH PROPOFOL;  Surgeon: Hulen Luster, MD;  Location: [facility] ENDOSCOPY;  Service: Gastroenterology;  Laterality: N/A;   ESOPHAGOGASTRODUODENOSCOPY (EGD) WITH PROPOFOL N/A 10/30/2019   Procedure: ESOPHAGOGASTRODUODENOSCOPY (EGD) WITH PROPOFOL;  Surgeon: Toledo, Benay Pike, MD;  Location: [facility] ENDOSCOPY;  Service: Gastroenterology;  Laterality: N/A;   FOOT SURGERY Right 1988   LEG SURGERY Bilateral 1957, 1958   MASTECTOMY Right    PORTACATH PLACEMENT  10/2012   recently removed   [place]      Prior to Admission medications   Medication Sig Start Date End Date Taking? Authorizing Provider  aspirin 81 MG EC tablet Take by mouth. Patient not taking: Reported on 06/10/2021 03/09/19   [provider]  atorvastatin (LIPITOR) 20 MG tablet Take 20 mg by mouth daily.    [provider]  Calcium Carbonate (CALCIUM 600 PO) Take 1 tablet by mouth daily. Higher education careers adviser, Historical, MD  cetirizine (ZYRTEC) 10 MG tablet Take 10 mg by mouth daily. AM    [provider]  Cholecalciferol (VITAMIN D3) 2000 UNITS TABS Take 1 tablet by mouth daily. Higher education careers adviser, Historical, MD  denosumab (PROLIA) 60 MG/ML SOSY injection Inject 60 mg into the skin every 6 (six) months.    [provider]  diclofenac Sodium (VOLTAREN) 1 % GEL Apply topically.    [provider]  esomeprazole (NEXIUM) 20 MG capsule Take by mouth. Patient not taking: Reported on 06/10/2021 01/06/21 01/06/22  [provider]  famotidine (PEPCID) 20 MG tablet Take 20 mg by mouth daily. Am Patient not taking: Reported on 06/10/2021    [provider]  gabapentin (NEURONTIN) 300 MG capsule Take 1 capsule (300 mg total) by mouth 3 (three) times daily. 10/01/15   Cammie Sickle, MD  Multiple Vitamin (MULTIVITAMIN) tablet Take 1 tablet by mouth daily. Higher education careers adviser, Historical, MD  neomycin-polymyxin  b-dexamethasone (MAXITROL) 3.5-10000-0.1 [place] 1 application into the left eye at bedtime. Patient not taking: Reported on 06/10/2021 05/26/18   [provider]  nicotine polacrilex (NICORETTE) 2 MG gum Take 2 mg by mouth as needed for smoking cessation. Patient not taking: Reported on 06/10/2021    [provider]  prednisoLONE acetate (PRED FORTE) 1 % ophthalmic suspension Place 1 drop into the left eye daily. 05/26/18   [provider]    Allergies Augmentin [amoxicillin-pot clavulanate] and Macrobid [nitrofurantoin macrocrystal]  Family History  Problem Relation Age of Onset   Breast cancer Sister 26   Ovarian cancer Sister    Lung cancer Other    Dementia Mother    Lung cancer Father    Dementia Maternal Uncle    Diabetes Paternal Uncle    Cancer Maternal Grandmother        "female cancer"   Heart attack Maternal Uncle     Social  History Social History   Tobacco Use   Smoking status: Former    Packs/day: 1.00    Years: 35.00    Pack years: 35.00    Types: Cigarettes    Quit date: 10/20/2003    Years since quitting: 17.9   Smokeless tobacco: Never   Tobacco comments:    Smoking History stopped smoking in 2005. smoked 1 pk/per day x 32 yrs  Vaping Use   Vaping Use: Never used  Substance Use Topics   Alcohol use: No    Alcohol/week: 0.0 standard drinks   Drug use: No    Review of Systems   Review of Systems  Respiratory:  Positive for chest tightness and shortness of breath.   Cardiovascular:  Positive for chest pain.  Gastrointestinal:  Negative for nausea and vomiting.  All other systems reviewed and are negative.  Physical Exam Updated Vital Signs BP 112/70 (BP Location: Left Arm)   Pulse 88   Temp 98.2 F (36.8 C) (Oral)   Resp (!) 26   Ht 5' (1.524 m)   SpO2 100%   BMI 22.15 kg/m   Physical Exam Vitals and nursing note reviewed.  Constitutional:      General: She is not in acute distress.    Appearance: Normal  appearance.  HENT:     Head: Normocephalic and atraumatic.  Eyes:     General: No scleral icterus.    Conjunctiva/sclera: Conjunctivae normal.  Pulmonary:     Effort: Pulmonary effort is normal. No respiratory distress.     Breath sounds: No stridor.  Musculoskeletal:        General: No deformity or signs of injury.     Cervical back: Normal range of motion.  Skin:    General: Skin is dry.     Coloration: Skin is not jaundiced or pale.  Neurological:     General: No focal deficit present.     Mental Status: She is alert and oriented to person, place, and time. Mental status is at baseline.  Psychiatric:        Mood and Affect: Mood normal.        Behavior: Behavior normal.     LABS (all labs ordered are listed, but only abnormal results are displayed)  Labs Reviewed - No data to display ____________________________________________  EKG N/a ____________________________________________  RADIOLOGY Almeta Monas, personally viewed and evaluated these images (plain radiographs) as part of my medical decision making, as well as reviewing the written report by the radiologist.  ED MD interpretation:  I reviewed the CXR which does not show any acute cardiopulmonary process      ____________________________________________   PROCEDURES  Procedure(s) performed (including Critical Care):  Procedures   ____________________________________________   INITIAL IMPRESSION / ASSESSMENT AND PLAN / ED COURSE   Patient presenting with a food bolus.  On initial arrival patient is in respiratory distress and sats are in the 60s.  However this resolved prior to intervention.  She denies choking or feeling short of breath.  Has a history of similar in the past but is always able to pass it on her own.  Was eating [place] and feels that it is still in her midesophagus.  Will try glucagon especially given the likely distal nature of the food bolus impaction.  If this does not help  we will likely need to discuss endoscopy with GI.  Patient given IV glucagon and felt like she passed the food bolus.  She was able to tolerate  p.o.  Will discharge with GI follow-up.  Advised to avoid meat and chew extensively. ____________________________________________   FINAL CLINICAL IMPRESSION(S) / ED DIAGNOSES  Final diagnoses:  Food impaction of esophagus, initial encounter     ED Discharge Orders     None        Note:  This document was prepared using Dragon voice recognition software and may include unintentional dictation errors.    Rada Hay, MD 09/11/21 2129

## 2021-12-08 ENCOUNTER — Telehealth: Payer: Self-pay | Admitting: *Deleted

## 2021-12-08 NOTE — Telephone Encounter (Signed)
Patient needs to reschedule all appointments on 2//21.

## 2021-12-08 NOTE — Telephone Encounter (Signed)
Done

## 2021-12-09 ENCOUNTER — Inpatient Hospital Stay: Payer: Medicare HMO

## 2021-12-09 ENCOUNTER — Inpatient Hospital Stay: Payer: Medicare HMO | Admitting: Internal Medicine

## 2021-12-15 ENCOUNTER — Encounter: Payer: Self-pay | Admitting: Internal Medicine

## 2021-12-18 ENCOUNTER — Inpatient Hospital Stay (HOSPITAL_BASED_OUTPATIENT_CLINIC_OR_DEPARTMENT_OTHER): Payer: Medicare HMO | Admitting: Internal Medicine

## 2021-12-18 ENCOUNTER — Inpatient Hospital Stay: Payer: Medicare HMO

## 2021-12-18 ENCOUNTER — Inpatient Hospital Stay: Payer: Medicare HMO | Attending: Internal Medicine

## 2021-12-18 ENCOUNTER — Encounter: Payer: Self-pay | Admitting: Internal Medicine

## 2021-12-18 ENCOUNTER — Other Ambulatory Visit: Payer: Self-pay

## 2021-12-18 VITALS — BP 142/74 | HR 86 | Temp 96.5°F | Ht 60.0 in | Wt 107.7 lb

## 2021-12-18 DIAGNOSIS — Z79811 Long term (current) use of aromatase inhibitors: Secondary | ICD-10-CM

## 2021-12-18 DIAGNOSIS — M8580 Other specified disorders of bone density and structure, unspecified site: Secondary | ICD-10-CM | POA: Diagnosis present

## 2021-12-18 DIAGNOSIS — D72820 Lymphocytosis (symptomatic): Secondary | ICD-10-CM

## 2021-12-18 DIAGNOSIS — C50811 Malignant neoplasm of overlapping sites of right female breast: Secondary | ICD-10-CM

## 2021-12-18 DIAGNOSIS — M85859 Other specified disorders of bone density and structure, unspecified thigh: Secondary | ICD-10-CM

## 2021-12-18 LAB — CBC WITH DIFFERENTIAL/PLATELET
Abs Immature Granulocytes: 0.02 10*3/uL (ref 0.00–0.07)
Basophils Absolute: 0.1 10*3/uL (ref 0.0–0.1)
Basophils Relative: 2 %
Eosinophils Absolute: 0.4 10*3/uL (ref 0.0–0.5)
Eosinophils Relative: 5 %
HCT: 34.7 % — ABNORMAL LOW (ref 36.0–46.0)
Hemoglobin: 11.1 g/dL — ABNORMAL LOW (ref 12.0–15.0)
Immature Granulocytes: 0 %
Lymphocytes Relative: 50 %
Lymphs Abs: 3.7 10*3/uL (ref 0.7–4.0)
MCH: 30.7 pg (ref 26.0–34.0)
MCHC: 32 g/dL (ref 30.0–36.0)
MCV: 95.9 fL (ref 80.0–100.0)
Monocytes Absolute: 0.5 10*3/uL (ref 0.1–1.0)
Monocytes Relative: 7 %
Neutro Abs: 2.7 10*3/uL (ref 1.7–7.7)
Neutrophils Relative %: 36 %
Platelets: 233 10*3/uL (ref 150–400)
RBC: 3.62 MIL/uL — ABNORMAL LOW (ref 3.87–5.11)
RDW: 13.7 % (ref 11.5–15.5)
WBC: 7.4 10*3/uL (ref 4.0–10.5)
nRBC: 0 % (ref 0.0–0.2)

## 2021-12-18 LAB — COMPREHENSIVE METABOLIC PANEL
ALT: 12 U/L (ref 0–44)
AST: 26 U/L (ref 15–41)
Albumin: 3.2 g/dL — ABNORMAL LOW (ref 3.5–5.0)
Alkaline Phosphatase: 50 U/L (ref 38–126)
Anion gap: 7 (ref 5–15)
BUN: 11 mg/dL (ref 8–23)
CO2: 28 mmol/L (ref 22–32)
Calcium: 8.4 mg/dL — ABNORMAL LOW (ref 8.9–10.3)
Chloride: 101 mmol/L (ref 98–111)
Creatinine, Ser: 0.61 mg/dL (ref 0.44–1.00)
GFR, Estimated: >60 mL/min (ref >60)
Glucose, Bld: 92 mg/dL (ref 70–99)
Potassium: 3.6 mmol/L (ref 3.5–5.1)
Sodium: 136 mmol/L (ref 135–145)
Total Bilirubin: 0.4 mg/dL (ref 0.3–1.2)
Total Protein: 6.3 g/dL — ABNORMAL LOW (ref 6.5–8.1)

## 2021-12-18 MED ORDER — DENOSUMAB 60 MG/ML ~~LOC~~ SOSY
60.0000 mg | PREFILLED_SYRINGE | Freq: Once | SUBCUTANEOUS | Status: AC
  Administered 2021-12-18: 60 mg via SUBCUTANEOUS
  Filled 2021-12-18: qty 1

## 2021-12-18 NOTE — Progress Notes (Signed)
Last last [facility] [place] OFFICE PROGRESS NOTE  Patient Care Team: Gayland Curry, MD as PCP - General (Family Medicine) Bary Castilla, Forest Gleason, MD (General Surgery) Gayland Curry, MD as Referring Physician [facility] Medicine)   Cancer Staging  No matching staging information was found for the patient.   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   # AUG 2012-? Mild-Leucocytosis/lymphocytosis [? 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   #August 2021-EGD colonoscopy CT scan [weight loss]-negative  # Post Polio neuropathy/wheel chair   # FEB 2019- T-score of -2.3 [osteopenia] AUG 2019- START PROLIA q 61M ---------------------------------------------  DIAGNOSIS: LOBULAR BREAST CA  STAGE:  II       ;GOALS: CURE  CURRENT/MOST RECENT THERAPY: Arimdex [sep 1700]    Malignant neoplasm of overlapping sites of right breast in female, estrogen receptor positive ([place])  11/24/2019 Genetic Testing   BARD1 c.166A>G VUS identified on the common hereditary cancer panel.  The Common Hereditary Gene Panel offered by [facility] includes sequencing and/or deletion duplication testing of the following 48 genes: APC, ATM, AXIN2, BARD1, BMPR1A, BRCA1, BRCA2, BRIP1, CDH1, CDK4, CDKN2A (p14ARF), CDKN2A (p16INK4a), CHEK2, CTNNA1, DICER1, EPCAM (Deletion/duplication testing only), GREM1 (promoter region deletion/duplication testing only), KIT, MEN1, MLH1, MSH2, MSH3, MSH6, MUTYH, NBN, NF1, NHTL1, PALB2, PDGFRA, PMS2, POLD1, POLE, PTEN, RAD50, RAD51C, RAD51D, RNF43, SDHB, SDHC, SDHD, SMAD4, SMARCA4. STK11, TP53, TSC1, TSC2, and VHL.  The following genes were  evaluated for sequence changes only: SDHA and HOXB13 c.251G>A variant only. The report date is November 24, 2019.       INTERVAL HISTORY: Patient is in a motorized wheelchair [Hx of polio]; accompanied by husband.  Erika Miller 76 y.o.  female pleasant patient above history of stage II lobular breast cancer currently on on surveillance; mild lymphocytosis/ osteopenia on Prolia is here for follow-up/proceed with prolia.   Denies any swelling in the legs.  Denies any nausea vomiting.  No lumps or bumps.  No fevers or chills.  Chronic mild fatigue.  Review of Systems  Constitutional:  Positive for malaise/fatigue. Negative for chills, diaphoresis and fever.  HENT:  Negative for nosebleeds and sore throat.   Eyes:  Negative for double vision.  Respiratory:  Negative for cough, hemoptysis, sputum production, shortness of breath and wheezing.   Cardiovascular:  Negative for chest pain, palpitations, orthopnea and leg swelling.  Gastrointestinal:  Negative for abdominal pain, blood in stool, constipation, diarrhea, heartburn, melena, nausea and vomiting.  Genitourinary:  Negative for dysuria, frequency and urgency.  Musculoskeletal:  Negative for back pain and joint pain.  Skin: Negative.  Negative for itching and rash.  Neurological:  Negative for dizziness, tingling, weakness and headaches. 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 Endo/Heme/Allergies:  Does not bruise/bleed easily.  Psychiatric/Behavioral:  Negative for depression. The patient is not nervous/anxious and does not have insomnia.     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, 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)    Osteopenia    Osteoporosis    Personal history of chemotherapy    Polio    Has been in a wheelchair last 10-12 years from weakness and instability   PONV (postoperative nausea and vomiting) 1 time   after hysterectomy   Reflux    Scoliosis    Seizures ([place]) x3   Dr [facility] could find nothing wrong.Dec 2015   Transfusion history     PAST SURGICAL HISTORY :   Past Surgical History:  Procedure Laterality Date   ABDOMINAL HYSTERECTOMY  08/10/83   ovaries were not removed   BACK SURGERY     x 2   BREAST BIOPSY Right 2008, 2013   2008 negative, 2013 positive   BREAST BIOPSY Left 04-24-14   Calcifications associated with stroma and sclerosing adenosis   BREAST SURGERY Right 10/2012   Mastectomy with s/n bx, and radiation therapy   CATARACT EXTRACTION W/PHACO Right 02/20/2015   Procedure: CATARACT EXTRACTION PHACO AND INTRAOCULAR LENS PLACEMENT ([place]);  Surgeon: Leandrew Koyanagi, MD;  Location: [place];  Service: Ophthalmology;  Laterality: Right;   CATARACT EXTRACTION W/PHACO Left 01/10/2020   Procedure: CATARACT EXTRACTION PHACO AND INTRAOCULAR LENS PLACEMENT ([place]) LEFT;  Surgeon: Leandrew Koyanagi, MD;  Location: [place];  Service: Ophthalmology;  Laterality: Left;  9.13 0:59.7 15.3%   COLONOSCOPY  2011   Dr. Candace Cruise, 2 benign polyps removed   COLONOSCOPY WITH PROPOFOL N/A 08/12/2015   Procedure: COLONOSCOPY WITH PROPOFOL;  Surgeon: Hulen Luster, MD;  Location: [facility] ENDOSCOPY;  Service: Gastroenterology;  Laterality: N/A;   COLONOSCOPY WITH PROPOFOL N/A 10/30/2019   Procedure: COLONOSCOPY WITH PROPOFOL;  Surgeon: Toledo, Benay Pike, MD;  Location: [facility] ENDOSCOPY;  Service:  Gastroenterology;  Laterality: N/A;   ESOPHAGOGASTRODUODENOSCOPY (EGD) WITH PROPOFOL N/A 08/12/2015   Procedure: ESOPHAGOGASTRODUODENOSCOPY (EGD) WITH PROPOFOL;  Surgeon: Hulen Luster, MD;  Location: [facility] ENDOSCOPY;  Service: Gastroenterology;  Laterality: N/A;   ESOPHAGOGASTRODUODENOSCOPY (EGD) WITH PROPOFOL N/A 10/30/2019   Procedure: ESOPHAGOGASTRODUODENOSCOPY (EGD) WITH PROPOFOL;  Surgeon: Toledo, Benay Pike, MD;  Location: [facility] ENDOSCOPY;  Service: Gastroenterology;  Laterality: N/A;   FOOT SURGERY Right 1988   LEG SURGERY Bilateral 1957, 1958   MASTECTOMY Right    PORTACATH PLACEMENT  10/2012   recently removed   [place] HISTORY :   Family History  Problem Relation Age of Onset   Breast cancer Sister 47   Ovarian cancer Sister    Lung cancer Other    Dementia Mother    Lung cancer Father    Dementia Maternal Uncle    Diabetes Paternal Uncle    Cancer Maternal Grandmother        "female cancer"   Heart attack Maternal Uncle     SOCIAL HISTORY:   Social History   Tobacco Use   Smoking status: Former    Packs/day: 1.00    Years: 35.00    Pack years: 35.00    Types: Cigarettes    Quit date: 10/20/2003    Years since quitting: 18.1   Smokeless  tobacco: Never   Tobacco comments:    Smoking History stopped smoking in 2005. smoked 1 pk/per day x 32 yrs  Vaping Use   Vaping Use: Never used  Substance Use Topics   Alcohol use: No    Alcohol/week: 0.0 standard drinks   Drug use: No    ALLERGIES:  is allergic to augmentin [amoxicillin-pot clavulanate] and macrobid [nitrofurantoin macrocrystal].  MEDICATIONS:  Current Outpatient Medications  Medication Sig Dispense Refill   aspirin 81 MG EC tablet Take by mouth.     atorvastatin (LIPITOR) 20 MG tablet Take 20 mg by mouth daily.     Calcium Carbonate (CALCIUM 600 PO) Take 1 tablet by mouth daily. Dinner     cetirizine (ZYRTEC) 10 MG tablet Take 10 mg  by mouth daily. AM     Cholecalciferol (VITAMIN D3) 2000 UNITS TABS Take 1 tablet by mouth daily. Dinner     denosumab (PROLIA) 60 MG/ML SOSY injection Inject 60 mg into the skin every 6 (six) months.     diclofenac Sodium (VOLTAREN) 1 % GEL Apply topically.     esomeprazole (NEXIUM) 20 MG capsule Take by mouth.     gabapentin (NEURONTIN) 300 MG capsule Take 1 capsule (300 mg total) by mouth 3 (three) times daily. 90 capsule 3   gabapentin (NEURONTIN) 300 MG capsule Take 1 capsule by mouth 3 (three) times daily.     Multiple Vitamin (MULTIVITAMIN) tablet Take 1 tablet by mouth daily. Dinner     nicotine polacrilex (NICORETTE) 2 MG gum Take 2 mg by mouth as needed for smoking cessation.     prednisoLONE acetate (PRED FORTE) 1 % ophthalmic suspension Place 1 drop into the left eye daily.  0   famotidine (PEPCID) 20 MG tablet Take 20 mg by mouth daily. Am (Patient not taking: Reported on 06/10/2021)     No current facility-administered medications for this visit.   Facility-Administered Medications Ordered in Other Visits  Medication Dose Route Frequency Provider Last Rate Last Admin   denosumab (PROLIA) injection 60 mg  60 mg Subcutaneous Once Cammie Sickle, MD        PHYSICAL EXAMINATION: ECOG PERFORMANCE STATUS: 2 - Symptomatic, <50% confined to bed  BP (!) 142/74 (BP Location: Right Arm, Patient Position: Sitting, Cuff Size: Small)    Pulse 86    Temp (!) 96.5 F (35.8 C) (Tympanic)    Ht 5' (1.524 m)    Wt 107 lb 11.2 oz (48.9 kg)    SpO2 100%    BMI 21.03 kg/m   Filed Weights   12/18/21 0941  Weight: 107 lb 11.2 oz (48.9 kg)    Physical Exam Constitutional:      Comments: In a wheelchair.  Chronic/polio.  Alone.   Thin built; frail appearing.  HENT:     Head: Normocephalic and atraumatic.     Mouth/Throat:     Pharynx: No oropharyngeal exudate.  Eyes:     Pupils: Pupils are equal, round, and reactive to light.  Cardiovascular:     Rate and Rhythm:  Normal rate and regular rhythm.  Pulmonary:     Effort: Pulmonary effort is normal. No respiratory distress.     Breath sounds: Normal breath sounds. No wheezing.  Abdominal:     General: Bowel sounds are normal. There is no distension.     Palpations: Abdomen is soft. There is no mass.     Tenderness: There is no abdominal tenderness. There is no guarding or rebound.  Musculoskeletal:  General: No tenderness. Normal range of motion.     Cervical back: Normal range of motion and neck supple.  Skin:    General: Skin is warm.  Neurological:     Mental Status: She is alert and oriented to person, place, and time.     Comments: Chronic left lower extremity weakness muscle atrophy-secondary to polio.  Psychiatric:        Mood and Affect: Affect normal.  ;    LABORATORY DATA:  I have reviewed the data as listed    Component Value Date/Time   NA 136 12/18/2021 0909   NA 140 05/15/2013 1038   K 3.6 12/18/2021 0909   K 3.6 05/15/2013 1038   CL 101 12/18/2021 0909   CL 106 05/15/2013 1038   CO2 28 12/18/2021 0909   CO2 29 05/15/2013 1038   GLUCOSE 92 12/18/2021 0909   GLUCOSE 95 05/15/2013 1038   BUN 11 12/18/2021 0909   BUN 7 05/15/2013 1038   CREATININE 0.61 12/18/2021 0909   CREATININE 0.63 05/28/2014 1028   CALCIUM 8.4 (L) 12/18/2021 0909   CALCIUM 9.2 05/15/2013 1038   PROT 6.3 (L) 12/18/2021 0909   PROT 6.8 05/28/2014 1028   ALBUMIN 3.2 (L) 12/18/2021 0909   ALBUMIN 3.1 (L) 05/28/2014 1028   AST 26 12/18/2021 0909   AST 22 05/28/2014 1028   ALT 12 12/18/2021 0909   ALT 21 05/28/2014 1028   ALKPHOS 50 12/18/2021 0909   ALKPHOS 98 05/28/2014 1028   BILITOT 0.4 12/18/2021 0909   BILITOT 0.4 05/28/2014 1028   GFRNONAA >60 12/18/2021 0909   GFRNONAA >60 05/28/2014 1028   GFRAA >60 06/11/2020 1305   GFRAA >60 05/28/2014 1028    No results found for: SPEP, UPEP  Lab Results  Component Value Date   WBC 7.4 12/18/2021   NEUTROABS 2.7 12/18/2021   HGB 11.1  (L) 12/18/2021   HCT 34.7 (L) 12/18/2021   MCV 95.9 12/18/2021   PLT 233 12/18/2021      Chemistry      Component Value Date/Time   NA 136 12/18/2021 0909   NA 140 05/15/2013 1038   K 3.6 12/18/2021 0909   K 3.6 05/15/2013 1038   CL 101 12/18/2021 0909   CL 106 05/15/2013 1038   CO2 28 12/18/2021 0909   CO2 29 05/15/2013 1038   BUN 11 12/18/2021 0909   BUN 7 05/15/2013 1038   CREATININE 0.61 12/18/2021 0909   CREATININE 0.63 05/28/2014 1028      Component Value Date/Time   CALCIUM 8.4 (L) 12/18/2021 0909   CALCIUM 9.2 05/15/2013 1038   ALKPHOS 50 12/18/2021 0909   ALKPHOS 98 05/28/2014 1028   AST 26 12/18/2021 0909   AST 22 05/28/2014 1028   ALT 12 12/18/2021 0909   ALT 21 05/28/2014 1028   BILITOT 0.4 12/18/2021 0909   BILITOT 0.4 05/28/2014 1028       RADIOGRAPHIC STUDIES: I have personally reviewed the radiological images as listed and agreed with the findings in the report. No results found.    ASSESSMENT & PLAN:  Lymphocytosis # Mild lymphocytosis- WBC-9 ; ALC- 5; Gamma delta (GD) T cell lymphocytosis detected, with expression of CD57, a marker of large granular lymphocytes (LGLs); DEC 29th, 2020-CT C/A//P- NED. Today- ALC- 3.7-WNL- STABLE.   # Chronic mild anemia: Hemoglobin around 11-12.  Stable.  Monitor for now.  # STAGE II  RIGHT BREAST CA/Lobular cancer-[s/p adjuvant Arimidex-06/2018]; july 2022 [Dr.Byrnett]-Left Uni-Lat mammogram reviewed- STABLE.   #  Dysphagia-intermittent- chronic [Dr.Toledo; [facility]] EGD/[facility]-JAN 2021- STABLE.   # BMD- osteopenia- FEB 2022 -2.4; STABLE; -Mild hypocal 8.4 [ca-corrected 8. for albumin]  continue calcium plus vitamin D twice a day; ok with Prolia.   # DISPO: # Prolia today # follow up in 6 months- MD; labs- cbc/cmp/Prolia;  Dr.B    Orders Placed This Encounter  Procedures   CBC with Differential/Platelet    Standing Status:   Future    Standing Expiration Date:   12/19/2022   Comprehensive metabolic panel     Standing Status:   Future    Standing Expiration Date:   12/19/2022   All questions were answered. The patient knows to call the clinic with any problems, questions or concerns.      Cammie Sickle, MD 12/18/2021 10:19 AM

## 2021-12-18 NOTE — Assessment & Plan Note (Signed)
#   Mild lymphocytosis- WBC-9 ; ALC- 5; Gamma delta (GD) T cell lymphocytosis detected, with expression of CD57, a marker of large granular lymphocytes (LGLs); DEC 29th, 2020-CT C/A//P- NED. Today- ALC- 3.7-WNL- STABLE.  ? ?# Chronic mild anemia: Hemoglobin around 11-12.  Stable.  Monitor for now. ? ?# STAGE II  RIGHT BREAST CA/Lobular cancer-[s/p adjuvant Arimidex-06/2018]; july 2022 [Dr.Byrnett]-Left Uni-Lat mammogram reviewed- STABLE.  ? ?# Dysphagia-intermittent- chronic [Dr.Toledo; [facility]] EGD/[place]-JAN 2021- STABLE.  ? ?# BMD- osteopenia- FEB 2022 -2.4; STABLE; -Mild hypocal 8.4 [ca-corrected 8. for albumin]  continue calcium plus vitamin D twice a day; ok with Prolia.  ? ?# DISPO: ?# Prolia today ?# follow up in 6 months- MD; labs- cbc/cmp/Prolia;  Dr.B ? ?

## 2021-12-24 ENCOUNTER — Other Ambulatory Visit: Payer: Self-pay | Admitting: Family Medicine

## 2021-12-24 DIAGNOSIS — Z1231 Encounter for screening mammogram for malignant neoplasm of breast: Secondary | ICD-10-CM

## 2022-04-28 ENCOUNTER — Ambulatory Visit
Admission: RE | Admit: 2022-04-28 | Discharge: 2022-04-28 | Disposition: A | Discharge status: Home / Self Care | Payer: Medicare HMO | Source: Ambulatory Visit | Attending: Family Medicine | Admitting: Family Medicine

## 2022-04-28 DIAGNOSIS — Z1231 Encounter for screening mammogram for malignant neoplasm of breast: Secondary | ICD-10-CM | POA: Diagnosis present

## 2022-06-25 ENCOUNTER — Inpatient Hospital Stay: Payer: Medicare HMO | Attending: Internal Medicine

## 2022-06-25 ENCOUNTER — Inpatient Hospital Stay (HOSPITAL_BASED_OUTPATIENT_CLINIC_OR_DEPARTMENT_OTHER): Payer: Medicare HMO | Admitting: Internal Medicine

## 2022-06-25 ENCOUNTER — Encounter: Payer: Self-pay | Admitting: Internal Medicine

## 2022-06-25 ENCOUNTER — Inpatient Hospital Stay: Payer: Medicare HMO

## 2022-06-25 VITALS — BP 126/63 | HR 85 | Temp 95.5°F | Ht 60.0 in | Wt 106.7 lb

## 2022-06-25 DIAGNOSIS — Z79811 Long term (current) use of aromatase inhibitors: Secondary | ICD-10-CM

## 2022-06-25 DIAGNOSIS — R1310 Dysphagia, unspecified: Secondary | ICD-10-CM | POA: Insufficient documentation

## 2022-06-25 DIAGNOSIS — C50811 Malignant neoplasm of overlapping sites of right female breast: Secondary | ICD-10-CM | POA: Diagnosis not present

## 2022-06-25 DIAGNOSIS — D649 Anemia, unspecified: Secondary | ICD-10-CM | POA: Diagnosis not present

## 2022-06-25 DIAGNOSIS — D72820 Lymphocytosis (symptomatic): Secondary | ICD-10-CM | POA: Diagnosis not present

## 2022-06-25 DIAGNOSIS — M8580 Other specified disorders of bone density and structure, unspecified site: Secondary | ICD-10-CM | POA: Insufficient documentation

## 2022-06-25 DIAGNOSIS — M85859 Other specified disorders of bone density and structure, unspecified thigh: Secondary | ICD-10-CM | POA: Diagnosis not present

## 2022-06-25 DIAGNOSIS — Z170 Estrogen receptor positive status [ER+]: Secondary | ICD-10-CM

## 2022-06-25 DIAGNOSIS — Z79899 Other long term (current) drug therapy: Secondary | ICD-10-CM | POA: Diagnosis not present

## 2022-06-25 LAB — CBC WITH DIFFERENTIAL/PLATELET
Abs Immature Granulocytes: 0.03 10*3/uL (ref 0.00–0.07)
Basophils Absolute: 0.1 10*3/uL (ref 0.0–0.1)
Basophils Relative: 1 %
Eosinophils Absolute: 0.4 10*3/uL (ref 0.0–0.5)
Eosinophils Relative: 4 %
HCT: 36.4 % (ref 36.0–46.0)
Hemoglobin: 11.7 g/dL — ABNORMAL LOW (ref 12.0–15.0)
Immature Granulocytes: 0 %
Lymphocytes Relative: 58 %
Lymphs Abs: 4.6 10*3/uL — ABNORMAL HIGH (ref 0.7–4.0)
MCH: 31 pg (ref 26.0–34.0)
MCHC: 32.1 g/dL (ref 30.0–36.0)
MCV: 96.6 fL (ref 80.0–100.0)
Monocytes Absolute: 0.5 10*3/uL (ref 0.1–1.0)
Monocytes Relative: 6 %
Neutro Abs: 2.5 10*3/uL (ref 1.7–7.7)
Neutrophils Relative %: 31 %
Platelets: 293 10*3/uL (ref 150–400)
RBC: 3.77 MIL/uL — ABNORMAL LOW (ref 3.87–5.11)
RDW: 13.1 % (ref 11.5–15.5)
WBC: 8.1 10*3/uL (ref 4.0–10.5)
nRBC: 0 % (ref 0.0–0.2)

## 2022-06-25 LAB — COMPREHENSIVE METABOLIC PANEL
ALT: 12 U/L (ref 0–44)
AST: 23 U/L (ref 15–41)
Albumin: 3.2 g/dL — ABNORMAL LOW (ref 3.5–5.0)
Alkaline Phosphatase: 57 U/L (ref 38–126)
Anion gap: 5 (ref 5–15)
BUN: 13 mg/dL (ref 8–23)
CO2: 30 mmol/L (ref 22–32)
Calcium: 8.8 mg/dL — ABNORMAL LOW (ref 8.9–10.3)
Chloride: 104 mmol/L (ref 98–111)
Creatinine, Ser: 0.63 mg/dL (ref 0.44–1.00)
GFR, Estimated: >60 mL/min (ref >60)
Glucose, Bld: 101 mg/dL — ABNORMAL HIGH (ref 70–99)
Potassium: 3.9 mmol/L (ref 3.5–5.1)
Sodium: 139 mmol/L (ref 135–145)
Total Bilirubin: 0.3 mg/dL (ref 0.3–1.2)
Total Protein: 6.6 g/dL (ref 6.5–8.1)

## 2022-06-25 MED ORDER — DENOSUMAB 60 MG/ML ~~LOC~~ SOSY
60.0000 mg | PREFILLED_SYRINGE | Freq: Once | SUBCUTANEOUS | Status: AC
  Administered 2022-06-25: 60 mg via SUBCUTANEOUS
  Filled 2022-06-25: qty 1

## 2022-06-25 NOTE — Assessment & Plan Note (Addendum)
#   Mild lymphocytosis; chronic mild-anemia hemoglobin 11-12; normal platelets WBC-9 ; ALC- 5; Gamma delta (GD) T cell lymphocytosis detected, with expression of CD57, a marker of large granular lymphocytes (LGLs); DEC 29th, 2020-CT C/A//P- NED. Today- ALC- 5.6-WNL- overall STABLE.   # Chronic mild anemia: Hemoglobin around 11-12.  Stable.  Monitor for now.  # STAGE II  RIGHT BREAST CA/Lobular cancer-[s/p adjuvant Arimidex-06/2018]; currently on surveillance only.  july 2023 [Dr.Byrnett]-Left Uni-Lat mammogram reviewed- STABLE.   # Dysphagia-intermittent- chronic [Dr.Toledo; [facility]] EGD/[facility]o-JAN 2021- STABLE.   # BMD- osteopenia- FEB 2022 -2.4; STABLE [on prolia since- NOV 2019]; -Mild hypocal 8.8 [ ok corrected for albumin]   ok with Prolia.   # DISPO: # Prolia today # follow up in 6 months- MD; labs- cbc/cmp/Prolia;  Dr.B

## 2022-06-25 NOTE — Patient Instructions (Signed)
Take calcium 1000 mg once a day; and Vit D 2000 IU a day.

## 2022-06-25 NOTE — Progress Notes (Signed)
No concerns. 

## 2022-06-25 NOTE — Progress Notes (Signed)
Last last [facility] [facility] OFFICE PROGRESS NOTE  Patient Care Team: Gayland Curry, MD as PCP - General (Family Medicine) Bary Castilla, Forest Gleason, MD (General Surgery) Gayland Curry, MD as Referring Physician [facility])   Cancer Staging  No matching staging information was found for the patient.   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   # AUG 2012-? Mild-Leucocytosis/lymphocytosis [? 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   #August 2021-EGD colonoscopy CT scan [weight loss]-negative  # Post Polio neuropathy/wheel chair   # FEB 2019- T-score of -2.3 [osteopenia] AUG 2019- START PROLIA q 65M ---------------------------------------------  DIAGNOSIS: LOBULAR BREAST CA  STAGE:  II       ;GOALS: CURE  CURRENT/MOST RECENT THERAPY: Arimdex [sep 9030]    Malignant neoplasm of overlapping sites of right breast in female, estrogen receptor positive ([place])  11/24/2019 Genetic Testing   BARD1 c.166A>G VUS identified on the common hereditary cancer panel.  The Common Hereditary Gene Panel offered by [facility] includes sequencing and/or deletion duplication testing of the following 48 genes: APC, ATM, AXIN2, BARD1, BMPR1A, BRCA1, BRCA2, BRIP1, CDH1, CDK4, CDKN2A (p14ARF), CDKN2A (p16INK4a), CHEK2, CTNNA1, DICER1, EPCAM (Deletion/duplication testing only), GREM1 (promoter region deletion/duplication testing only), KIT, MEN1, MLH1, MSH2, MSH3, MSH6, MUTYH, NBN, NF1, NHTL1, PALB2, PDGFRA, PMS2, POLD1, POLE, PTEN, RAD50, RAD51C, RAD51D, RNF43, SDHB, SDHC, SDHD, SMAD4, SMARCA4. STK11, TP53, TSC1, TSC2, and VHL.  The following genes were  evaluated for sequence changes only: SDHA and HOXB13 c.251G>A variant only. The report date is November 24, 2019.       INTERVAL HISTORY: Patient is in a motorized wheelchair [Hx of polio]; accompanied by husband.  Erika Miller 76 y.o.  female pleasant patient above history of stage II lobular breast cancer currently on on surveillance; mild lymphocytosis/ osteopenia on Prolia is here for follow-up/proceed with prolia.   Denies any swelling in the legs.  Denies any nausea vomiting.  No lumps or bumps.  No fevers or chills.  Chronic mild fatigue.  Review of Systems  Constitutional:  Positive for malaise/fatigue. Negative for chills, diaphoresis and fever.  HENT:  Negative for nosebleeds and sore throat.   Eyes:  Negative for double vision.  Respiratory:  Negative for cough, hemoptysis, sputum production, shortness of breath and wheezing.   Cardiovascular:  Negative for chest pain, palpitations, orthopnea and leg swelling.  Gastrointestinal:  Negative for abdominal pain, blood in stool, constipation, diarrhea, heartburn, melena, nausea and vomiting.  Genitourinary:  Negative for dysuria, frequency and urgency.  Musculoskeletal:  Negative for back pain and joint pain.  Skin: Negative.  Negative for itching and rash.  Neurological:  Negative for dizziness, tingling, weakness and headaches. 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 Endo/Heme/Allergies:  Does not bruise/bleed easily.  Psychiatric/Behavioral:  Negative for depression. The patient is not nervous/anxious and does not have insomnia.      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, 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)    Osteopenia    Osteoporosis    Personal history of chemotherapy    Polio    Has been in a wheelchair last 10-12 years from weakness and instability   PONV (postoperative nausea and vomiting) 1 time   after hysterectomy   Reflux    Scoliosis    Seizures ([place]) x3   Dr [facility] could find nothing wrong.Dec 2015   Transfusion history     PAST SURGICAL HISTORY :   Past Surgical History:  Procedure Laterality Date   ABDOMINAL HYSTERECTOMY  08/10/83   ovaries were not removed   BACK SURGERY     x 2   BREAST BIOPSY Right 2008, 2013   2008 negative, 2013 positive   BREAST BIOPSY Left 04-24-14   Calcifications associated with stroma and sclerosing adenosis   BREAST SURGERY Right 10/2012   Mastectomy with s/n bx, and radiation therapy   CATARACT EXTRACTION W/PHACO Right 02/20/2015   Procedure: CATARACT EXTRACTION PHACO AND INTRAOCULAR LENS PLACEMENT ([place]);  Surgeon: Leandrew Koyanagi, MD;  Location: [facility];  Service: Ophthalmology;  Laterality: Right;   CATARACT EXTRACTION W/PHACO Left 01/10/2020   Procedure: CATARACT EXTRACTION PHACO AND INTRAOCULAR LENS PLACEMENT ([place]) LEFT;  Surgeon: Leandrew Koyanagi, MD;  Location: [facility];  Service: Ophthalmology;  Laterality: Left;  9.13 0:59.7 15.3%   COLONOSCOPY  2011   Dr. Candace Cruise, 2 benign polyps removed   COLONOSCOPY WITH PROPOFOL N/A 08/12/2015   Procedure: COLONOSCOPY WITH PROPOFOL;  Surgeon: Hulen Luster, MD;  Location: [facility] ENDOSCOPY;  Service: Gastroenterology;  Laterality: N/A;   COLONOSCOPY WITH PROPOFOL N/A 10/30/2019   Procedure: COLONOSCOPY WITH PROPOFOL;  Surgeon: Toledo, Benay Pike, MD;  Location: [facility] ENDOSCOPY;  Service: Gastroenterology;  Laterality: N/A;    ESOPHAGOGASTRODUODENOSCOPY (EGD) WITH PROPOFOL N/A 08/12/2015   Procedure: ESOPHAGOGASTRODUODENOSCOPY (EGD) WITH PROPOFOL;  Surgeon: Hulen Luster, MD;  Location[facility] ENDOSCOPY;  Service: Gastroenterology;  Laterality: N/A;   ESOPHAGOGASTRODUODENOSCOPY (EGD) WITH PROPOFOL N/A 10/30/2019   Procedure: ESOPHAGOGASTRODUODENOSCOPY (EGD) WITH PROPOFOL;  Surgeon: Toledo, Benay Pike, MD;  Location: [facility] ENDOSCOPY;  Service: Gastroenterology;  Laterality: N/A;   FOOT SURGERY Right 1988   LEG SURGERY Bilateral 1957, 1958   MASTECTOMY Right    PORTACATH PLACEMENT  10/2012   recently removed   [place] HISTORY :   Family History  Problem Relation Age of Onset   Breast cancer Sister 28   Ovarian cancer Sister    Lung cancer Other    Dementia Mother    Lung cancer Father    Dementia Maternal Uncle    Diabetes Paternal Uncle    Cancer Maternal Grandmother        "female cancer"   Heart attack Maternal Uncle     SOCIAL HISTORY:   Social History   Tobacco Use   Smoking status: Former    Packs/day: 1.00    Years: 35.00    Total pack years: 35.00    Types: Cigarettes    Quit date: 10/20/2003    Years since quitting: 18.6  Smokeless tobacco: Never   Tobacco comments:    Smoking History stopped smoking in 2005. smoked 1 pk/per day x 32 yrs  Vaping Use   Vaping Use: Never used  Substance Use Topics   Alcohol use: No    Alcohol/week: 0.0 standard drinks of alcohol   Drug use: No    ALLERGIES:  is allergic to augmentin [amoxicillin-pot clavulanate] and macrobid [nitrofurantoin macrocrystal].  MEDICATIONS:  Current Outpatient Medications  Medication Sig Dispense Refill   aspirin 81 MG EC tablet Take by mouth.     atorvastatin (LIPITOR) 20 MG tablet Take 20 mg by mouth daily.     Calcium Carbonate (CALCIUM 600 PO) Take 1 tablet by mouth daily. Dinner     cetirizine (ZYRTEC) 10 MG tablet Take 10 mg by mouth daily. AM     Cholecalciferol  (VITAMIN D3) 2000 UNITS TABS Take 1 tablet by mouth daily. Dinner     denosumab (PROLIA) 60 MG/ML SOSY injection Inject 60 mg into the skin every 6 (six) months.     diclofenac Sodium (VOLTAREN) 1 % GEL Apply topically.     gabapentin (NEURONTIN) 300 MG capsule Take 1 capsule (300 mg total) by mouth 3 (three) times daily. 90 capsule 3   Multiple Vitamin (MULTIVITAMIN) tablet Take 1 tablet by mouth daily. Dinner     nicotine polacrilex (NICORETTE) 2 MG gum Take 2 mg by mouth as needed for smoking cessation.     esomeprazole (NEXIUM) 20 MG capsule Take by mouth.     No current facility-administered medications for this visit.    PHYSICAL EXAMINATION: ECOG PERFORMANCE STATUS: 2 - Symptomatic, <50% confined to bed  BP 126/63 (BP Location: Left Arm, Patient Position: Sitting, Cuff Size: Normal)   Pulse 85   Temp (!) 95.5 F (35.3 C) (Tympanic)   Ht 5' (1.524 m)   Wt 106 lb 11.2 oz (48.4 kg)   SpO2 100%   BMI 20.84 kg/m   Filed Weights   06/25/22 1035  Weight: 106 lb 11.2 oz (48.4 kg)    Physical Exam Constitutional:      Comments: In a wheelchair.  Chronic/polio.  Alone.   Thin built; frail appearing.  HENT:     Head: Normocephalic and atraumatic.     Mouth/Throat:     Pharynx: No oropharyngeal exudate.  Eyes:     Pupils: Pupils are equal, round, and reactive to light.  Cardiovascular:     Rate and Rhythm: Normal rate and regular rhythm.  Pulmonary:     Effort: Pulmonary effort is normal. No respiratory distress.     Breath sounds: Normal breath sounds. No wheezing.  Abdominal:     General: Bowel sounds are normal. There is no distension.     Palpations: Abdomen is soft. There is no mass.     Tenderness: There is no abdominal tenderness. There is no guarding or rebound.  Musculoskeletal:        General: No tenderness. Normal range of motion.     Cervical back: Normal range of motion and neck supple.  Skin:    General: Skin is warm.  Neurological:     Mental Status:  She is alert and oriented to person, place, and time.     Comments: Chronic left lower extremity weakness muscle atrophy-secondary to polio.  Psychiatric:        Mood and Affect: Affect normal.   ;    LABORATORY DATA:  I have reviewed the data as listed    Component Value  Date/Time   NA 139 06/25/2022 0952   NA 140 05/15/2013 1038   K 3.9 06/25/2022 0952   K 3.6 05/15/2013 1038   CL 104 06/25/2022 0952   CL 106 05/15/2013 1038   CO2 30 06/25/2022 0952   CO2 29 05/15/2013 1038   GLUCOSE 101 (H) 06/25/2022 0952   GLUCOSE 95 05/15/2013 1038   BUN 13 06/25/2022 0952   BUN 7 05/15/2013 1038   CREATININE 0.63 06/25/2022 0952   CREATININE 0.63 05/28/2014 1028   CALCIUM 8.8 (L) 06/25/2022 0952   CALCIUM 9.2 05/15/2013 1038   PROT 6.6 06/25/2022 0952   PROT 6.8 05/28/2014 1028   ALBUMIN 3.2 (L) 06/25/2022 0952   ALBUMIN 3.1 (L) 05/28/2014 1028   AST 23 06/25/2022 0952   AST 22 05/28/2014 1028   ALT 12 06/25/2022 0952   ALT 21 05/28/2014 1028   ALKPHOS 57 06/25/2022 0952   ALKPHOS 98 05/28/2014 1028   BILITOT 0.3 06/25/2022 0952   BILITOT 0.4 05/28/2014 1028   GFRNONAA >60 06/25/2022 0952   GFRNONAA >60 05/28/2014 1028   GFRAA >60 06/11/2020 1305   GFRAA >60 05/28/2014 1028    No results found for: "SPEP", "UPEP"  Lab Results  Component Value Date   WBC 8.1 06/25/2022   NEUTROABS 2.5 06/25/2022   HGB 11.7 (L) 06/25/2022   HCT 36.4 06/25/2022   MCV 96.6 06/25/2022   PLT 293 06/25/2022      Chemistry      Component Value Date/Time   NA 139 06/25/2022 0952   NA 140 05/15/2013 1038   K 3.9 06/25/2022 0952   K 3.6 05/15/2013 1038   CL 104 06/25/2022 0952   CL 106 05/15/2013 1038   CO2 30 06/25/2022 0952   CO2 29 05/15/2013 1038   BUN 13 06/25/2022 0952   BUN 7 05/15/2013 1038   CREATININE 0.63 06/25/2022 0952   CREATININE 0.63 05/28/2014 1028      Component Value Date/Time   CALCIUM 8.8 (L) 06/25/2022 0952   CALCIUM 9.2 05/15/2013 1038   ALKPHOS 57  06/25/2022 0952   ALKPHOS 98 05/28/2014 1028   AST 23 06/25/2022 0952   AST 22 05/28/2014 1028   ALT 12 06/25/2022 0952   ALT 21 05/28/2014 1028   BILITOT 0.3 06/25/2022 0952   BILITOT 0.4 05/28/2014 1028       RADIOGRAPHIC STUDIES: I have personally reviewed the radiological images as listed and agreed with the findings in the report. No results found.    ASSESSMENT & PLAN:  Lymphocytosis # Mild lymphocytosis; chronic mild-anemia hemoglobin 11-12; normal platelets WBC-9 ; ALC- 5; Gamma delta (GD) T cell lymphocytosis detected, with expression of CD57, a marker of large granular lymphocytes (LGLs); DEC 29th, 2020-CT C/A//P- NED. Today- ALC- 5.6-WNL- overall STABLE.   # Chronic mild anemia: Hemoglobin around 11-12.  Stable.  Monitor for now.  # STAGE II  RIGHT BREAST CA/Lobular cancer-[s/p adjuvant Arimidex-06/2018]; currently on surveillance only.  july 2023 [Dr.Byrnett]-Left Uni-Lat mammogram reviewed- STABLE.   # Dysphagia-intermittent- chronic [Dr.Toledo; [facility]] EGD/[place]-JAN 2021- STABLE.   # BMD- osteopenia- FEB 2022 -2.4; STABLE [on prolia since- NOV 2019]; -Mild hypocal 8.8 [ ok corrected for albumin]   ok with Prolia.   # DISPO: # Prolia today # follow up in 6 months- MD; labs- cbc/cmp/Prolia;  Dr.B     Orders Placed This Encounter  Procedures   CBC with Differential/Platelet    Standing Status:   Future    Standing Expiration Date:  06/26/2023   Comprehensive metabolic panel    Standing Status:   Future    Standing Expiration Date:   06/26/2023   All questions were answered. The patient knows to call the clinic with any problems, questions or concerns.      Cammie Sickle, MD 06/25/2022 2:12 PM

## 2022-12-24 ENCOUNTER — Encounter: Payer: Self-pay | Admitting: Internal Medicine

## 2022-12-24 ENCOUNTER — Inpatient Hospital Stay: Payer: Medicare HMO

## 2022-12-24 ENCOUNTER — Inpatient Hospital Stay: Payer: Medicare HMO | Attending: Internal Medicine | Admitting: Internal Medicine

## 2022-12-24 VITALS — BP 120/78 | HR 73 | Temp 96.7°F | Resp 16 | Wt 107.0 lb

## 2022-12-24 DIAGNOSIS — M542 Cervicalgia: Secondary | ICD-10-CM | POA: Insufficient documentation

## 2022-12-24 DIAGNOSIS — Z8612 Personal history of poliomyelitis: Secondary | ICD-10-CM | POA: Insufficient documentation

## 2022-12-24 DIAGNOSIS — C50811 Malignant neoplasm of overlapping sites of right female breast: Secondary | ICD-10-CM | POA: Insufficient documentation

## 2022-12-24 DIAGNOSIS — D649 Anemia, unspecified: Secondary | ICD-10-CM | POA: Insufficient documentation

## 2022-12-24 DIAGNOSIS — Z79811 Long term (current) use of aromatase inhibitors: Secondary | ICD-10-CM

## 2022-12-24 DIAGNOSIS — D72820 Lymphocytosis (symptomatic): Secondary | ICD-10-CM

## 2022-12-24 DIAGNOSIS — M85859 Other specified disorders of bone density and structure, unspecified thigh: Secondary | ICD-10-CM

## 2022-12-24 DIAGNOSIS — M810 Age-related osteoporosis without current pathological fracture: Secondary | ICD-10-CM | POA: Insufficient documentation

## 2022-12-24 DIAGNOSIS — Z170 Estrogen receptor positive status [ER+]: Secondary | ICD-10-CM

## 2022-12-24 DIAGNOSIS — Z7983 Long term (current) use of bisphosphonates: Secondary | ICD-10-CM | POA: Diagnosis not present

## 2022-12-24 LAB — COMPREHENSIVE METABOLIC PANEL
ALT: 10 U/L (ref 0–44)
AST: 21 U/L (ref 15–41)
Albumin: 3.3 g/dL — ABNORMAL LOW (ref 3.5–5.0)
Alkaline Phosphatase: 52 U/L (ref 38–126)
Anion gap: 6 (ref 5–15)
BUN: 14 mg/dL (ref 8–23)
CO2: 28 mmol/L (ref 22–32)
Calcium: 8.4 mg/dL — ABNORMAL LOW (ref 8.9–10.3)
Chloride: 104 mmol/L (ref 98–111)
Creatinine, Ser: 0.54 mg/dL (ref 0.44–1.00)
GFR, Estimated: >60 mL/min (ref >60)
Glucose, Bld: 89 mg/dL (ref 70–99)
Potassium: 3.9 mmol/L (ref 3.5–5.1)
Sodium: 138 mmol/L (ref 135–145)
Total Bilirubin: 0.5 mg/dL (ref 0.3–1.2)
Total Protein: 6.7 g/dL (ref 6.5–8.1)

## 2022-12-24 LAB — CBC WITH DIFFERENTIAL/PLATELET
Abs Immature Granulocytes: 0.03 10*3/uL (ref 0.00–0.07)
Basophils Absolute: 0.1 10*3/uL (ref 0.0–0.1)
Basophils Relative: 1 %
Eosinophils Absolute: 0.3 10*3/uL (ref 0.0–0.5)
Eosinophils Relative: 4 %
HCT: 34.4 % — ABNORMAL LOW (ref 36.0–46.0)
Hemoglobin: 11 g/dL — ABNORMAL LOW (ref 12.0–15.0)
Immature Granulocytes: 0 %
Lymphocytes Relative: 50 %
Lymphs Abs: 4.1 10*3/uL — ABNORMAL HIGH (ref 0.7–4.0)
MCH: 30.6 pg (ref 26.0–34.0)
MCHC: 32 g/dL (ref 30.0–36.0)
MCV: 95.8 fL (ref 80.0–100.0)
Monocytes Absolute: 0.6 10*3/uL (ref 0.1–1.0)
Monocytes Relative: 7 %
Neutro Abs: 3.2 10*3/uL (ref 1.7–7.7)
Neutrophils Relative %: 38 %
Platelets: 269 10*3/uL (ref 150–400)
RBC: 3.59 MIL/uL — ABNORMAL LOW (ref 3.87–5.11)
RDW: 13.6 % (ref 11.5–15.5)
WBC: 8.3 10*3/uL (ref 4.0–10.5)
nRBC: 0 % (ref 0.0–0.2)

## 2022-12-24 NOTE — Progress Notes (Signed)
Worsening neck pain but does not want wok up.

## 2022-12-24 NOTE — Progress Notes (Signed)
Last last [facility] [place] OFFICE PROGRESS NOTE  Patient Care Team: Gayland Curry, MD as PCP - General (Family Medicine) Bary Castilla, Forest Gleason, MD (General Surgery) Gayland Curry, MD as Referring Physician [facility])   Cancer Staging  No matching staging information was found for the patient.   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   # AUG 2012-? Mild-Leucocytosis/lymphocytosis [? 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   #August 2021-EGD colonoscopy CT scan [weight loss]-negative  # Post Polio neuropathy/wheel chair   # FEB 2019- T-score of -2.3 [osteopenia] AUG 2019- START PROLIA q 25M ---------------------------------------------  DIAGNOSIS: LOBULAR BREAST CA  STAGE:  II       ;GOALS: CURE  CURRENT/MOST RECENT THERAPY: Arimdex [sep [id number]    Malignant neoplasm of overlapping sites of right breast in female, estrogen receptor positive ([place])  11/24/2019 Genetic Testing   BARD1 c.166A>G VUS identified on the common hereditary cancer panel.  The Common Hereditary Gene Panel offered by [facility] includes sequencing and/or deletion duplication testing of the following 48 genes: APC, ATM, AXIN2, BARD1, BMPR1A, BRCA1, BRCA2, BRIP1, CDH1, CDK4, CDKN2A (p14ARF), CDKN2A (p16INK4a), CHEK2, CTNNA1, DICER1, EPCAM (Deletion/duplication testing only), GREM1 (promoter region deletion/duplication testing only), KIT, MEN1, MLH1, MSH2, MSH3, MSH6, MUTYH, NBN, NF1, NHTL1, PALB2, PDGFRA, PMS2, POLD1, POLE, PTEN, RAD50, RAD51C, RAD51D, RNF43, SDHB, SDHC, SDHD, SMAD4, SMARCA4. STK11, TP53, TSC1, TSC2, and VHL.  The following genes were  evaluated for sequence changes only: SDHA and HOXB13 c.251G>A variant only. The report date is November 24, 2019.      INTERVAL HISTORY: Patient is in a motorized wheelchair [Hx of polio]; accompanied by husband.  Erika Miller 77 y.o.  female pleasant patient above history of stage II lobular breast cancer currently on on surveillance; mild lymphocytosis/ osteopenia on Prolia is here for follow-up/proceed with prolia.   Complains of neck pain. Hx of neck surgeries at [facility].  Denies any swelling in the legs.  Denies any nausea vomiting.  No lumps or bumps.  No fevers or chills.  Chronic mild fatigue.  Review of Systems  Constitutional:  Positive for malaise/fatigue. Negative for chills, diaphoresis and fever.  HENT:  Negative for nosebleeds and sore throat.   Eyes:  Negative for double vision.  Respiratory:  Negative for cough, hemoptysis, sputum production, shortness of breath and wheezing.   Cardiovascular:  Negative for chest pain, palpitations, orthopnea and leg swelling.  Gastrointestinal:  Negative for abdominal pain, blood in stool, constipation, diarrhea, heartburn, melena, nausea and vomiting.  Genitourinary:  Negative for dysuria, frequency and urgency.  Musculoskeletal:  Negative for back pain and joint pain.  Skin: Negative.  Negative for itching and rash.  Neurological:  Negative for dizziness, tingling, weakness and headaches. 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 Endo/Heme/Allergies:  Does not bruise/bleed easily.  Psychiatric/Behavioral:  Negative for depression. The patient is not nervous/anxious and does not have insomnia.      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, 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)    Osteopenia    Osteoporosis    Personal history of chemotherapy    Polio    Has been in a wheelchair last 10-12 years from weakness and instability   PONV (postoperative nausea and vomiting) 1 time   after hysterectomy   Reflux    Scoliosis    Seizures ([place]) x3   Dr [facility] could find nothing wrong.Dec 2015   Transfusion history     PAST SURGICAL HISTORY :   Past Surgical History:  Procedure Laterality Date   ABDOMINAL HYSTERECTOMY  08/10/83   ovaries were not removed   BACK SURGERY     x 2   BREAST BIOPSY Right 2008, 2013   2008 negative, 2013 positive   BREAST BIOPSY Left 04-24-14   Calcifications associated with stroma and sclerosing adenosis   BREAST SURGERY Right 10/2012   Mastectomy with s/n bx, and radiation therapy   CATARACT EXTRACTION W/PHACO Right 02/20/2015   Procedure: CATARACT EXTRACTION PHACO AND INTRAOCULAR LENS PLACEMENT ([place]);  Surgeon: Leandrew Koyanagi, MD;  Location: [place];  Service: Ophthalmology;  Laterality: Right;   CATARACT EXTRACTION W/PHACO Left 01/10/2020   Procedure: CATARACT EXTRACTION PHACO AND INTRAOCULAR LENS PLACEMENT ([place]) LEFT;  Surgeon: Leandrew Koyanagi, MD;  Location: [place];  Service: Ophthalmology;  Laterality: Left;  9.13 0:59.7 15.3%   COLONOSCOPY  2011   Dr. Candace Cruise, 2 benign polyps removed   COLONOSCOPY WITH PROPOFOL N/A 08/12/2015   Procedure: COLONOSCOPY WITH PROPOFOL;  Surgeon: Hulen Luster, MD;  Location[facility] ENDOSCOPY;  Service: Gastroenterology;  Laterality: N/A;   COLONOSCOPY WITH PROPOFOL N/A 10/30/2019   Procedure: COLONOSCOPY WITH PROPOFOL;  Surgeon: Toledo, Benay Pike, MD;  Location:  [facility] ENDOSCOPY;  Service: Gastroenterology;  Laterality: N/A;   ESOPHAGOGASTRODUODENOSCOPY (EGD) WITH PROPOFOL N/A 08/12/2015   Procedure: ESOPHAGOGASTRODUODENOSCOPY (EGD) WITH PROPOFOL;  Surgeon: Hulen Luster, MD;  Location: [facility] ENDOSCOPY;  Service: Gastroenterology;  Laterality: N/A;   ESOPHAGOGASTRODUODENOSCOPY (EGD) WITH PROPOFOL N/A 10/30/2019   Procedure: ESOPHAGOGASTRODUODENOSCOPY (EGD) WITH PROPOFOL;  Surgeon: Toledo, Benay Pike, MD;  Location: [facility] ENDOSCOPY;  Service: Gastroenterology;  Laterality: N/A;   FOOT SURGERY Right 1988   LEG SURGERY Bilateral 1957, 1958   MASTECTOMY Right    PORTACATH PLACEMENT  10/2012   recently removed   [place] HISTORY :   Family History  Problem Relation Age of Onset   Breast cancer Sister 55   Ovarian cancer Sister    Lung cancer Other    Dementia Mother    Lung cancer Father    Dementia Maternal Uncle    Diabetes Paternal Uncle    Cancer Maternal Grandmother        "female cancer"   Heart attack Maternal Uncle     SOCIAL HISTORY:   Social History   Tobacco Use   Smoking status: Former    Packs/day: 1.00    Years: 35.00    Total pack years: 35.00    Types: Cigarettes    Quit  date: 10/20/2003    Years since quitting: 19.1   Smokeless tobacco: Never   Tobacco comments:    Smoking History stopped smoking in 2005. smoked 1 pk/per day x 32 yrs  Vaping Use   Vaping Use: Never used  Substance Use Topics   Alcohol use: No    Alcohol/week: 0.0 standard drinks of alcohol   Drug use: No    ALLERGIES:  is allergic to augmentin [amoxicillin-pot clavulanate] and macrobid [nitrofurantoin macrocrystal].  MEDICATIONS:  Current Outpatient Medications  Medication Sig Dispense Refill   aspirin 81 MG EC tablet Take by mouth.     atorvastatin (LIPITOR) 20 MG tablet Take 20 mg by mouth daily.     Calcium Carbonate (CALCIUM 600 PO) Take 1 tablet by mouth daily. Dinner     cetirizine (ZYRTEC) 10 MG  tablet Take 10 mg by mouth daily. AM     Cholecalciferol (VITAMIN D3) 2000 UNITS TABS Take 1 tablet by mouth daily. Dinner     denosumab (PROLIA) 60 MG/ML SOSY injection Inject 60 mg into the skin every 6 (six) months.     diclofenac Sodium (VOLTAREN) 1 % GEL Apply topically.     esomeprazole (NEXIUM) 20 MG capsule Take by mouth.     gabapentin (NEURONTIN) 300 MG capsule Take 1 capsule (300 mg total) by mouth 3 (three) times daily. 90 capsule 3   Multiple Vitamin (MULTIVITAMIN) tablet Take 1 tablet by mouth daily. Dinner     nicotine polacrilex (NICORETTE) 2 MG gum Take 2 mg by mouth as needed for smoking cessation.     No current facility-administered medications for this visit.    PHYSICAL EXAMINATION: ECOG PERFORMANCE STATUS: 2 - Symptomatic, <50% confined to bed  BP 120/78 (BP Location: Left Arm, Patient Position: Sitting)   Pulse 73   Temp (!) 96.7 F (35.9 C) (Tympanic)   Resp 16   Wt 107 lb (48.5 kg)   BMI 20.90 kg/m   Filed Weights   12/24/22 1000  Weight: 107 lb (48.5 kg)    Physical Exam Constitutional:      Comments: In a wheelchair.  Chronic/polio.  Alone.   Thin built; frail appearing.  HENT:     Head: Normocephalic and atraumatic.     Mouth/Throat:     Pharynx: No oropharyngeal exudate.  Eyes:     Pupils: Pupils are equal, round, and reactive to light.  Cardiovascular:     Rate and Rhythm: Normal rate and regular rhythm.  Pulmonary:     Effort: Pulmonary effort is normal. No respiratory distress.     Breath sounds: Normal breath sounds. No wheezing.  Abdominal:     General: Bowel sounds are normal. There is no distension.     Palpations: Abdomen is soft. There is no mass.     Tenderness: There is no abdominal tenderness. There is no guarding or rebound.  Musculoskeletal:        General: No tenderness. Normal range of motion.     Cervical back: Normal range of motion and neck supple.  Skin:    General: Skin is warm.  Neurological:     Mental Status:  She is alert and oriented to person, place, and time.     Comments: Chronic left lower extremity weakness muscle atrophy-secondary to polio.  Psychiatric:        Mood and Affect: Affect normal.   ;    LABORATORY DATA:  I have reviewed the data as listed    Component Value Date/Time  NA 138 12/24/2022 1022   NA 140 05/15/2013 1038   K 3.9 12/24/2022 1022   K 3.6 05/15/2013 1038   CL 104 12/24/2022 1022   CL 106 05/15/2013 1038   CO2 28 12/24/2022 1022   CO2 29 05/15/2013 1038   GLUCOSE 89 12/24/2022 1022   GLUCOSE 95 05/15/2013 1038   BUN 14 12/24/2022 1022   BUN 7 05/15/2013 1038   CREATININE 0.54 12/24/2022 1022   CREATININE 0.63 05/28/2014 1028   CALCIUM 8.4 (L) 12/24/2022 1022   CALCIUM 9.2 05/15/2013 1038   PROT 6.7 12/24/2022 1022   PROT 6.8 05/28/2014 1028   ALBUMIN 3.3 (L) 12/24/2022 1022   ALBUMIN 3.1 (L) 05/28/2014 1028   AST 21 12/24/2022 1022   AST 22 05/28/2014 1028   ALT 10 12/24/2022 1022   ALT 21 05/28/2014 1028   ALKPHOS 52 12/24/2022 1022   ALKPHOS 98 05/28/2014 1028   BILITOT 0.5 12/24/2022 1022   BILITOT 0.4 05/28/2014 1028   GFRNONAA >60 12/24/2022 1022   GFRNONAA >60 05/28/2014 1028   GFRAA >60 06/11/2020 1305   GFRAA >60 05/28/2014 1028    No results found for: "SPEP", "UPEP"  Lab Results  Component Value Date   WBC 8.3 12/24/2022   NEUTROABS 3.2 12/24/2022   HGB 11.0 (L) 12/24/2022   HCT 34.4 (L) 12/24/2022   MCV 95.8 12/24/2022   PLT 269 12/24/2022      Chemistry      Component Value Date/Time   NA 138 12/24/2022 1022   NA 140 05/15/2013 1038   K 3.9 12/24/2022 1022   K 3.6 05/15/2013 1038   CL 104 12/24/2022 1022   CL 106 05/15/2013 1038   CO2 28 12/24/2022 1022   CO2 29 05/15/2013 1038   BUN 14 12/24/2022 1022   BUN 7 05/15/2013 1038   CREATININE 0.54 12/24/2022 1022   CREATININE 0.63 05/28/2014 1028      Component Value Date/Time   CALCIUM 8.4 (L) 12/24/2022 1022   CALCIUM 9.2 05/15/2013 1038   ALKPHOS 52  12/24/2022 1022   ALKPHOS 98 05/28/2014 1028   AST 21 12/24/2022 1022   AST 22 05/28/2014 1028   ALT 10 12/24/2022 1022   ALT 21 05/28/2014 1028   BILITOT 0.5 12/24/2022 1022   BILITOT 0.4 05/28/2014 1028       RADIOGRAPHIC STUDIES: I have personally reviewed the radiological images as listed and agreed with the findings in the report. No results found.    ASSESSMENT & PLAN:  Lymphocytosis # Mild lymphocytosis; chronic mild-anemia hemoglobin 11-12; normal platelets WBC-9 ; ALC- 5; Gamma delta (GD) T cell lymphocytosis detected, with expression of CD57, a marker of large granular lymphocytes (LGLs); DEC 29th, 2020-CT C/A//P- NED. Today- ALC- 5.6-WNL- overall stable.   # Chronic mild anemia: Hemoglobin around 11-12.  stable.  Monitor for now. Will check iron studies; b12 levels.   # STAGE II  RIGHT BREAST CA/Lobular cancer-[s/p adjuvant Arimidex-06/2018]; currently on surveillance only.  july 2023 [Dr.Byrnett]-Left Uni-Lat mammogram reviewed- stable.    # Neck pain- radiation to right- recommend evaluation with PCP  # Dysphagia-intermittent- chronic [Dr.Toledo; [facility]] EGD/[facility]-JAN 2021- stable.   # BMD- osteopenia- FEB 2022 -2.4;  stable.  [on prolia since- NOV 2019]; -Mild hypocal 8.4  [ needs to corrected for albumin] HOLD Prolia. Will order BMD ;and then decide re: prolia. stable.   # DISPO: # left mammo/ BMD in July, 2024.  # HOLD Prolia today # follow up in 6  months- MD; labs- cbc/cmp; iron studies; ferritin; vit D 25-OH; and possible Prolia;  Dr.B   Orders Placed This Encounter  Procedures   MM 3D SCREENING MAMMOGRAM UNILATERAL LEFT BREAST    Standing Status:   Future    Standing Expiration Date:   12/24/2023    Order Specific Question:   Reason for Exam (SYMPTOM  OR DIAGNOSIS REQUIRED)    Answer:   hx breast cancer    Order Specific Question:   Preferred imaging location?    Answer:   [facility]   DG Bone Density    Standing Status:   Future    Standing  Expiration Date:   12/24/2023    Order Specific Question:   Reason for Exam (SYMPTOM  OR DIAGNOSIS REQUIRED)    Answer:   hx breast cancer    Order Specific Question:   Preferred imaging location?    Answer:   [facility]   CBC with Differential ([place] Only)    Standing Status:   Future    Standing Expiration Date:   12/24/2023   CMP ([place] only)    Standing Status:   Future    Standing Expiration Date:   12/24/2023   Iron and TIBC    Standing Status:   Future    Standing Expiration Date:   12/24/2023   Ferritin    Standing Status:   Future    Standing Expiration Date:   12/24/2023   VITAMIN D 25 Hydroxy (Vit-D Deficiency, Fractures)    Standing Status:   Future    Standing Expiration Date:   12/24/2023   All questions were answered. The patient knows to call the clinic with any problems, questions or concerns.      Cammie Sickle, MD 12/24/2022 11:50 AM

## 2022-12-24 NOTE — Assessment & Plan Note (Signed)
#   Mild lymphocytosis; chronic mild-anemia hemoglobin 11-12; normal platelets WBC-9 ; ALC- 5; Gamma delta (GD) T cell lymphocytosis detected, with expression of CD57, a marker of large granular lymphocytes (LGLs); DEC 29th, 2020-CT C/A//P- NED. Today- ALC- 5.6-WNL- overall stable.   # Chronic mild anemia: Hemoglobin around 11-12.  stable.  Monitor for now. Will check iron studies; b12 levels.   # STAGE II  RIGHT BREAST CA/Lobular cancer-[s/p adjuvant Arimidex-06/2018]; currently on surveillance only.  july 2023 [Dr.Byrnett]-Left Uni-Lat mammogram reviewed- stable.    # Neck pain- radiation to right- recommend evaluation with PCP  # Dysphagia-intermittent- chronic [Dr.Toledo; [facility]] EGD/[facility]-JAN 2021- stable.   # BMD- osteopenia- FEB 2022 -2.4;  stable.  [on prolia since- NOV 2019]; -Mild hypocal 8.4  [ needs to corrected for albumin] HOLD Prolia. Will order BMD ;and then decide re: prolia. stable.   # DISPO: # left mammo/ BMD in July, 2024.  # HOLD Prolia today # follow up in 6 months- MD; labs- cbc/cmp; iron studies; ferritin; vit D 25-OH; and possible Prolia;  Dr.B

## 2023-02-05 ENCOUNTER — Other Ambulatory Visit (HOSPITAL_BASED_OUTPATIENT_CLINIC_OR_DEPARTMENT_OTHER): Payer: Self-pay | Admitting: Family Medicine

## 2023-02-05 DIAGNOSIS — R413 Other amnesia: Secondary | ICD-10-CM

## 2023-02-12 ENCOUNTER — Ambulatory Visit: Admission: RE | Admit: 2023-02-12 | Payer: Medicare HMO | Source: Ambulatory Visit

## 2023-02-20 ENCOUNTER — Ambulatory Visit
Admission: RE | Admit: 2023-02-20 | Discharge: 2023-02-20 | Disposition: A | Discharge status: Home / Self Care | Payer: Medicare HMO | Source: Ambulatory Visit | Attending: Family Medicine | Admitting: Family Medicine

## 2023-02-20 DIAGNOSIS — R413 Other amnesia: Secondary | ICD-10-CM | POA: Diagnosis present

## 2023-02-20 MED ORDER — GADOBUTROL 1 MMOL/ML IV SOLN
4.0000 mL | Freq: Once | INTRAVENOUS | Status: AC | PRN
  Administered 2023-02-20: 4 mL via INTRAVENOUS

## 2023-05-04 ENCOUNTER — Ambulatory Visit
Admission: RE | Admit: 2023-05-04 | Discharge: 2023-05-04 | Disposition: A | Discharge status: Home / Self Care | Payer: Medicare HMO | Source: Ambulatory Visit | Attending: Internal Medicine | Admitting: Internal Medicine

## 2023-05-04 DIAGNOSIS — Z13820 Encounter for screening for osteoporosis: Secondary | ICD-10-CM | POA: Diagnosis not present

## 2023-05-04 DIAGNOSIS — Z9221 Personal history of antineoplastic chemotherapy: Secondary | ICD-10-CM | POA: Insufficient documentation

## 2023-05-04 DIAGNOSIS — Z853 Personal history of malignant neoplasm of breast: Secondary | ICD-10-CM | POA: Insufficient documentation

## 2023-05-04 DIAGNOSIS — M419 Scoliosis, unspecified: Secondary | ICD-10-CM | POA: Diagnosis not present

## 2023-05-04 DIAGNOSIS — C50811 Malignant neoplasm of overlapping sites of right female breast: Secondary | ICD-10-CM | POA: Insufficient documentation

## 2023-05-04 DIAGNOSIS — Z79811 Long term (current) use of aromatase inhibitors: Secondary | ICD-10-CM | POA: Diagnosis not present

## 2023-05-04 DIAGNOSIS — Z1231 Encounter for screening mammogram for malignant neoplasm of breast: Secondary | ICD-10-CM | POA: Insufficient documentation

## 2023-05-04 DIAGNOSIS — Z780 Asymptomatic menopausal state: Secondary | ICD-10-CM | POA: Diagnosis not present

## 2023-05-04 DIAGNOSIS — M85859 Other specified disorders of bone density and structure, unspecified thigh: Secondary | ICD-10-CM

## 2023-05-04 DIAGNOSIS — M8589 Other specified disorders of bone density and structure, multiple sites: Secondary | ICD-10-CM | POA: Diagnosis not present

## 2023-05-04 DIAGNOSIS — Z170 Estrogen receptor positive status [ER+]: Secondary | ICD-10-CM | POA: Insufficient documentation

## 2023-05-05 ENCOUNTER — Other Ambulatory Visit: Payer: Self-pay | Admitting: Internal Medicine

## 2023-05-05 DIAGNOSIS — R928 Other abnormal and inconclusive findings on diagnostic imaging of breast: Secondary | ICD-10-CM

## 2023-05-05 DIAGNOSIS — N6489 Other specified disorders of breast: Secondary | ICD-10-CM

## 2023-05-10 ENCOUNTER — Ambulatory Visit
Admission: RE | Admit: 2023-05-10 | Discharge: 2023-05-10 | Disposition: A | Discharge status: Home / Self Care | Payer: Medicare HMO | Source: Ambulatory Visit | Attending: Internal Medicine | Admitting: Internal Medicine

## 2023-05-10 ENCOUNTER — Other Ambulatory Visit: Payer: Self-pay | Admitting: Family Medicine

## 2023-05-10 DIAGNOSIS — R413 Other amnesia: Secondary | ICD-10-CM

## 2023-05-10 DIAGNOSIS — R928 Other abnormal and inconclusive findings on diagnostic imaging of breast: Secondary | ICD-10-CM | POA: Diagnosis not present

## 2023-05-10 DIAGNOSIS — N6489 Other specified disorders of breast: Secondary | ICD-10-CM

## 2023-05-22 ENCOUNTER — Encounter: Payer: Self-pay | Admitting: Emergency Medicine

## 2023-05-22 ENCOUNTER — Other Ambulatory Visit: Payer: Self-pay

## 2023-05-22 ENCOUNTER — Emergency Department: Payer: Medicare HMO

## 2023-05-22 ENCOUNTER — Emergency Department
Admission: EM | Admit: 2023-05-22 | Discharge: 2023-05-22 | Disposition: A | Discharge status: Home / Self Care | Payer: Medicare HMO | Attending: Emergency Medicine | Admitting: Emergency Medicine

## 2023-05-22 DIAGNOSIS — R0789 Other chest pain: Secondary | ICD-10-CM | POA: Insufficient documentation

## 2023-05-22 LAB — CBC
HCT: 36.1 % (ref 36.0–46.0)
Hemoglobin: 11.8 g/dL — ABNORMAL LOW (ref 12.0–15.0)
MCH: 30.6 pg (ref 26.0–34.0)
MCHC: 32.7 g/dL (ref 30.0–36.0)
MCV: 93.8 fL (ref 80.0–100.0)
Platelets: 272 10*3/uL (ref 150–400)
RBC: 3.85 MIL/uL — ABNORMAL LOW (ref 3.87–5.11)
RDW: 13.8 % (ref 11.5–15.5)
WBC: 9.7 10*3/uL (ref 4.0–10.5)
nRBC: 0 % (ref 0.0–0.2)

## 2023-05-22 LAB — BASIC METABOLIC PANEL
Anion gap: 6 (ref 5–15)
BUN: 11 mg/dL (ref 8–23)
CO2: 31 mmol/L (ref 22–32)
Calcium: 9.3 mg/dL (ref 8.9–10.3)
Chloride: 100 mmol/L (ref 98–111)
Creatinine, Ser: 0.6 mg/dL (ref 0.44–1.00)
GFR, Estimated: >60 mL/min (ref >60)
Glucose, Bld: 75 mg/dL (ref 70–99)
Potassium: 3.1 mmol/L — ABNORMAL LOW (ref 3.5–5.1)
Sodium: 137 mmol/L (ref 135–145)

## 2023-05-22 LAB — TROPONIN I (HIGH SENSITIVITY): Troponin I (High Sensitivity): 7 ng/L (ref <18)

## 2023-05-22 NOTE — Discharge Instructions (Addendum)
Please use ibuprofen (Motrin) up to 800 mg every 8 hours OR naproxen (Naprosyn) up to 500 mg every 12 hours.  Please do not use this medication regimen for longer than 7 days

## 2023-05-22 NOTE — ED Provider Notes (Signed)
[facility] Provider Note   Event Date/Time   First MD Initiated Contact with Patient 05/22/23 1707     (approximate) History  Chest Pain  HPI Erika Miller is a 77 y.o. female who presents complaining of of left lower chest wall pain that radiates from front to back, is intermittent lasting approximately 1 minute and decreasing in severity over the next few minutes.  Patient states that this pain occurs approximately once every hour and is described as a shooting, 10/10 pain in the left chest shooting around to her back.  Patient denies any associated shortness of breath but states that certain movements or taking a deep breath can exacerbate this pain. ROS: Patient currently denies any vision changes, tinnitus, difficulty speaking, facial droop, sore throat, shortness of breath, abdominal pain, nausea/vomiting/diarrhea, dysuria, or weakness/numbness/paresthesias in any extremity   Physical Exam  Triage Vital Signs: ED Triage Vitals  Encounter Vitals Group     BP 05/22/23 1541 123/78     Systolic BP Percentile --      Diastolic BP Percentile --      Pulse Rate 05/22/23 1541 94     Resp 05/22/23 1541 18     Temp 05/22/23 1541 98.2 F (36.8 C)     Temp Source 05/22/23 1541 Oral     SpO2 05/22/23 1541 99 %     Weight 05/22/23 1536 108 lb (49 kg)     Height 05/22/23 1536 5' (1.524 m)     Head Circumference --      Peak Flow --      Pain Score 05/22/23 1536 10     Pain Loc --      Pain Education --      Exclude from Growth Chart --    Most recent vital signs: Vitals:   05/22/23 1541 05/22/23 1723  BP: 123/78 (!) 109/95  Pulse: 94 (!) 56  Resp: 18 14  Temp: 98.2 F (36.8 C)   SpO2: 99% 90%   General: Awake, oriented x4. CV:  Good peripheral perfusion.  Resp:  Normal effort.  Abd:  No distention.  Other:  Elderly well-developed, well-nourished Caucasian female laying in bed in no acute distress ED Results / Procedures / Treatments  Labs (all labs  ordered are listed, but only abnormal results are displayed) Labs Reviewed  BASIC METABOLIC PANEL - Abnormal; Notable for the following components:      Result Value   Potassium 3.1 (*)    All other components within normal limits  CBC - Abnormal; Notable for the following components:   RBC 3.85 (*)    Hemoglobin 11.8 (*)    All other components within normal limits  TROPONIN I (HIGH SENSITIVITY)   EKG ED ECG REPORT I, Merwyn Katos, the attending physician, personally viewed and interpreted this ECG. Date: 05/22/2023 EKG Time: 1538 Rate: 90 Rhythm: normal sinus rhythm QRS Axis: normal Intervals: normal ST/T Wave abnormalities: normal Narrative Interpretation: no evidence of acute ischemia RADIOLOGY ED MD interpretation: 2 view chest x-ray interpreted by me shows no evidence of acute abnormalities including no pneumonia, pneumothorax, or widened mediastinum -Agree with radiology assessment Official radiology report(s): DG Chest 2 View  Result Date: 05/22/2023 CLINICAL DATA:  Chest pain. EXAM: CHEST - 2 VIEW COMPARISON:  09/11/2021, CT 10/17/2019 FINDINGS: The heart is normal in size. Stable mediastinal contours. Chronic increased AP diameter of the thorax. No focal airspace disease, large pleural effusion, or pneumothorax. No pulmonary edema. Moderate thoracic scoliosis.  The bones are diffusely under mineralized. On limited assessment, no acute osseous findings. IMPRESSION: No acute chest findings. Electronically Signed   By: Narda Rutherford M.D.   On: 05/22/2023 16:07   PROCEDURES: Critical Care performed: No .1-3 Lead EKG Interpretation  Performed by: Merwyn Katos, MD Authorized by: Merwyn Katos, MD     Interpretation: normal     ECG rate:  71   ECG rate assessment: normal     Rhythm: sinus rhythm     Ectopy: none     Conduction: normal    MEDICATIONS ORDERED IN ED: Medications - No data to display IMPRESSION / MDM / ASSESSMENT AND PLAN / ED COURSE  I reviewed  the triage vital signs and the nursing notes.                             The patient is on the cardiac monitor to evaluate for evidence of arrhythmia and/or significant heart rate changes. Patient's presentation is most consistent with acute presentation with potential threat to life or bodily function. This patient presents with atypical chest pain, most likely secondary to musculoskeletal injury. Differential diagnosis includes rib fracture, costochondritis, sternal fracture. Low suspicion for ACS, acute PE (PERC negative), pericarditis / myocarditis, thoracic aortic dissection, pneumothorax, pneumonia or other acute infectious process. Presentation not consistent with other acute, emergent causes of chest pain at this time. No indication for cardiac enzyme testing. Plan to order CXR to evaluate for acute cardiopulmonary causes.  Plan: EKG, CXR, pain control  Dispo: Discharge home with home care   FINAL CLINICAL IMPRESSION(S) / ED DIAGNOSES   Final diagnoses:  Left-sided chest wall pain   Rx / DC Orders   ED Discharge Orders     None      Note:  This document was prepared using Dragon voice recognition software and may include unintentional dictation errors.   Merwyn Katos, MD 05/22/23 [phone]

## 2023-05-22 NOTE — ED Triage Notes (Signed)
Pt reports chest pain since she woke up this morning. Pt reports sharp pain pointing to left side of chest. Pt talks in complete sentences no respiratory distress noted. Pt able to maneuver her electrical wheelchair no distress noted.

## 2023-05-22 NOTE — ED Notes (Signed)
Rn went to round and meet pt. Pt was still in wheelchair in room. Pt moved to stretcher and placed on cardiac monitor. Pt still having central CP that radiates under arms and around ribs.

## 2023-06-25 ENCOUNTER — Inpatient Hospital Stay: Payer: Medicare HMO

## 2023-06-25 ENCOUNTER — Encounter: Payer: Self-pay | Admitting: Internal Medicine

## 2023-06-25 ENCOUNTER — Inpatient Hospital Stay (HOSPITAL_BASED_OUTPATIENT_CLINIC_OR_DEPARTMENT_OTHER): Payer: Medicare HMO | Admitting: Internal Medicine

## 2023-06-25 ENCOUNTER — Inpatient Hospital Stay: Payer: Medicare HMO | Attending: Internal Medicine

## 2023-06-25 VITALS — BP 121/73 | HR 83 | Temp 96.7°F | Ht 60.0 in | Wt 109.3 lb

## 2023-06-25 DIAGNOSIS — D649 Anemia, unspecified: Secondary | ICD-10-CM | POA: Diagnosis not present

## 2023-06-25 DIAGNOSIS — Z170 Estrogen receptor positive status [ER+]: Secondary | ICD-10-CM

## 2023-06-25 DIAGNOSIS — Z79811 Long term (current) use of aromatase inhibitors: Secondary | ICD-10-CM

## 2023-06-25 DIAGNOSIS — C50811 Malignant neoplasm of overlapping sites of right female breast: Secondary | ICD-10-CM | POA: Diagnosis not present

## 2023-06-25 DIAGNOSIS — D72820 Lymphocytosis (symptomatic): Secondary | ICD-10-CM

## 2023-06-25 DIAGNOSIS — Z79899 Other long term (current) drug therapy: Secondary | ICD-10-CM | POA: Insufficient documentation

## 2023-06-25 DIAGNOSIS — M85859 Other specified disorders of bone density and structure, unspecified thigh: Secondary | ICD-10-CM

## 2023-06-25 DIAGNOSIS — R1310 Dysphagia, unspecified: Secondary | ICD-10-CM | POA: Insufficient documentation

## 2023-06-25 DIAGNOSIS — M8580 Other specified disorders of bone density and structure, unspecified site: Secondary | ICD-10-CM | POA: Diagnosis present

## 2023-06-25 LAB — CBC WITH DIFFERENTIAL (CANCER CENTER ONLY)
Abs Immature Granulocytes: 0.01 10*3/uL (ref 0.00–0.07)
Basophils Absolute: 0.1 10*3/uL (ref 0.0–0.1)
Basophils Relative: 1 %
Eosinophils Absolute: 0.3 10*3/uL (ref 0.0–0.5)
Eosinophils Relative: 4 %
HCT: 36.8 % (ref 36.0–46.0)
Hemoglobin: 11.7 g/dL — ABNORMAL LOW (ref 12.0–15.0)
Immature Granulocytes: 0 %
Lymphocytes Relative: 42 %
Lymphs Abs: 3.7 10*3/uL (ref 0.7–4.0)
MCH: 30.6 pg (ref 26.0–34.0)
MCHC: 31.8 g/dL (ref 30.0–36.0)
MCV: 96.3 fL (ref 80.0–100.0)
Monocytes Absolute: 0.6 10*3/uL (ref 0.1–1.0)
Monocytes Relative: 7 %
Neutro Abs: 4.1 10*3/uL (ref 1.7–7.7)
Neutrophils Relative %: 46 %
Platelet Count: 238 10*3/uL (ref 150–400)
RBC: 3.82 MIL/uL — ABNORMAL LOW (ref 3.87–5.11)
RDW: 13.2 % (ref 11.5–15.5)
WBC Count: 8.9 10*3/uL (ref 4.0–10.5)
nRBC: 0 % (ref 0.0–0.2)

## 2023-06-25 LAB — CMP (CANCER CENTER ONLY)
ALT: 11 U/L (ref 0–44)
AST: 20 U/L (ref 15–41)
Albumin: 3.3 g/dL — ABNORMAL LOW (ref 3.5–5.0)
Alkaline Phosphatase: 67 U/L (ref 38–126)
Anion gap: 6 (ref 5–15)
BUN: 12 mg/dL (ref 8–23)
CO2: 29 mmol/L (ref 22–32)
Calcium: 9.1 mg/dL (ref 8.9–10.3)
Chloride: 103 mmol/L (ref 98–111)
Creatinine: 0.5 mg/dL (ref 0.44–1.00)
GFR, Estimated: >60 mL/min (ref >60)
Glucose, Bld: 89 mg/dL (ref 70–99)
Potassium: 3.6 mmol/L (ref 3.5–5.1)
Sodium: 138 mmol/L (ref 135–145)
Total Bilirubin: 0.7 mg/dL (ref 0.3–1.2)
Total Protein: 6.4 g/dL — ABNORMAL LOW (ref 6.5–8.1)

## 2023-06-25 LAB — IRON AND TIBC
Iron: 80 ug/dL (ref 28–170)
Saturation Ratios: 30 % (ref 10.4–31.8)
TIBC: 272 ug/dL (ref 250–450)
UIBC: 192 ug/dL

## 2023-06-25 LAB — VITAMIN D 25 HYDROXY (VIT D DEFICIENCY, FRACTURES): Vit D, 25-Hydroxy: 49.63 ng/mL (ref 30–100)

## 2023-06-25 LAB — FERRITIN: Ferritin: 65 ng/mL (ref 11–307)

## 2023-06-25 MED ORDER — DENOSUMAB 60 MG/ML ~~LOC~~ SOSY
60.0000 mg | PREFILLED_SYRINGE | Freq: Once | SUBCUTANEOUS | Status: AC
  Administered 2023-06-25: 60 mg via SUBCUTANEOUS
  Filled 2023-06-25: qty 1

## 2023-06-25 NOTE — Progress Notes (Signed)
Last last [facility] OFFICE PROGRESS NOTE  Patient Care Team: Leim Fabry, MD as PCP - General (Family Medicine) Lemar Livings, Merrily Pew, MD (General Surgery) Leim Fabry, MD as Referring Physician (Family Medicine) Earna Coder, MD as Consulting Physician (Oncology)   Cancer Staging  No matching staging information was found for the patient.    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   # AUG 2012-? Mild-Leucocytosis/lymphocytosis [? 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   #August 2021-EGD colonoscopy CT scan [weight loss]-negative  # Post Polio neuropathy/wheel chair   # FEB 2019- T-score of -2.3 [osteopenia] AUG 2019- START PROLIA q 53M ---------------------------------------------  DIAGNOSIS: LOBULAR BREAST CA  STAGE:  II       ;GOALS: CURE  CURRENT/MOST RECENT THERAPY: Arimdex [sep 2019]    Malignant neoplasm of overlapping sites of right breast in female, estrogen receptor positive (HCC)  11/24/2019 Genetic Testing   BARD1 c.166A>G VUS identified on the common hereditary cancer panel.  The Common Hereditary Gene Panel offered by [facility] includes sequencing and/or deletion duplication testing of the following 48 genes: APC, ATM, AXIN2, BARD1, BMPR1A, BRCA1, BRCA2, BRIP1, CDH1, CDK4, CDKN2A (p14ARF), CDKN2A (p16INK4a), CHEK2, CTNNA1, DICER1, EPCAM (Deletion/duplication testing only), GREM1 (promoter region deletion/duplication testing only), KIT, MEN1, MLH1, MSH2, MSH3, MSH6, MUTYH, NBN, NF1, NHTL1, PALB2, PDGFRA, PMS2, POLD1, POLE, PTEN, RAD50, RAD51C, RAD51D, RNF43, SDHB, SDHC, SDHD, SMAD4, SMARCA4.  STK11, TP53, TSC1, TSC2, and VHL.  The following genes were evaluated for sequence changes only: SDHA and HOXB13 c.251G>A variant only. The report date is November 24, 2019.      INTERVAL HISTORY: Patient is in a motorized wheelchair [Hx of polio]; accompanied by husband.  Erika Miller 77 y.o.  female pleasant patient above history of stage II lobular breast cancer currently on on surveillance; mild lymphocytosis/ osteopenia on Prolia is here for follow-up/proceed with prolia.   Recently in ER for chest pain. No cardiac issues.   Complains of chronic constipation.   Complains of chronic neck pain. Hx of neck surgeries at [facility].  Denies any swelling in the legs.  Denies any nausea vomiting.  No lumps or bumps.  No fevers or chills.  Chronic mild fatigue.  Review of Systems  Constitutional:  Positive for malaise/fatigue. Negative for chills, diaphoresis and fever.  HENT:  Negative for nosebleeds and sore throat.   Eyes:  Negative for double vision.  Respiratory:  Negative for cough, hemoptysis, sputum production, shortness of breath and wheezing.   Cardiovascular:  Negative for chest pain, palpitations, orthopnea and leg swelling.  Gastrointestinal:  Negative for abdominal pain, blood in stool, constipation, diarrhea, heartburn, melena, nausea and vomiting.  Genitourinary:  Negative for dysuria, frequency and urgency.  Musculoskeletal:  Negative for back pain and joint pain.  Skin: Negative.  Negative for itching and rash.  Neurological:  Negative for dizziness, tingling, weakness and headaches. 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 Endo/Heme/Allergies:  Does not bruise/bleed easily.  Psychiatric/Behavioral:  Negative for depression. The patient is not nervous/anxious and does not have insomnia.      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)    Osteopenia    Osteoporosis    Personal history of chemotherapy    Polio    Has been in a wheelchair last 10-12 years from weakness and instability   PONV (postoperative nausea and vomiting) 1 time   after hysterectomy   Reflux    Scoliosis    Seizures (HCC) x3   Dr [facility] could find nothing wrong.Dec 2015   Transfusion history     PAST SURGICAL HISTORY :   Past Surgical History:  Procedure Laterality Date   ABDOMINAL HYSTERECTOMY  08/10/83   ovaries were not removed   BACK SURGERY     x 2   BREAST BIOPSY Right 2008, 2013   2008 negative, 2013 positive   BREAST BIOPSY Left 04-24-14   Calcifications associated with stroma and sclerosing adenosis   BREAST SURGERY Right 10/2012   Mastectomy with s/n bx, and radiation therapy   CATARACT EXTRACTION W/PHACO Right 02/20/2015   Procedure: CATARACT EXTRACTION PHACO AND INTRAOCULAR LENS PLACEMENT (IOC);  Surgeon: Lockie Mola, MD;  Location[facility];  Service: Ophthalmology;  Laterality: Right;   CATARACT EXTRACTION W/PHACO Left 01/10/2020   Procedure: CATARACT EXTRACTION PHACO AND INTRAOCULAR LENS PLACEMENT (IOC) LEFT;  Surgeon: Lockie Mola, MD;  Location: [facility];  Service: Ophthalmology;  Laterality: Left;  9.13 0:59.7 15.3%   COLONOSCOPY  2011   Dr. Bluford Kaufmann, 2 benign polyps removed   COLONOSCOPY WITH PROPOFOL N/A 08/12/2015   Procedure: COLONOSCOPY WITH PROPOFOL;  Surgeon: Wallace Cullens, MD;  Location: [facility] ENDOSCOPY;  Service:  Gastroenterology;  Laterality: N/A;   COLONOSCOPY WITH PROPOFOL N/A 10/30/2019   Procedure: COLONOSCOPY WITH PROPOFOL;  Surgeon: Toledo, Boykin Nearing, MD;  Location: [facility] ENDOSCOPY;  Service: Gastroenterology;  Laterality: N/A;   ESOPHAGOGASTRODUODENOSCOPY (EGD) WITH PROPOFOL N/A 08/12/2015   Procedure: ESOPHAGOGASTRODUODENOSCOPY (EGD) WITH PROPOFOL;  Surgeon: Wallace Cullens, MD;  Location: [facility] ENDOSCOPY;  Service: Gastroenterology;  Laterality: N/A;   ESOPHAGOGASTRODUODENOSCOPY (EGD) WITH PROPOFOL N/A 10/30/2019   Procedure: ESOPHAGOGASTRODUODENOSCOPY (EGD) WITH PROPOFOL;  Surgeon: Toledo, Boykin Nearing, MD;  Location: [facility] ENDOSCOPY;  Service: Gastroenterology;  Laterality: N/A;   FOOT SURGERY Right 1988   LEG SURGERY Bilateral 1957, 1958   MASTECTOMY Right    PORTACATH PLACEMENT  10/2012   recently removed   SPINE SURGERY  1959, 1960    TUBAL LIGATION      FAMILY HISTORY :   Family History  Problem Relation Age of Onset   Breast cancer Sister 11   Ovarian cancer Sister    Lung cancer Other    Dementia Mother    Lung cancer Father    Dementia Maternal Uncle    Diabetes Paternal Uncle    Cancer Maternal Grandmother        "female cancer"   Heart attack Maternal Uncle     SOCIAL HISTORY:   Social History   Tobacco Use   Smoking status: Former  Current packs/day: 0.00    Average packs/day: 1 pack/day for 35.0 years (35.0 ttl pk-yrs)    Types: Cigarettes    Start date: 10/19/1968    Quit date: 10/20/2003    Years since quitting: 19.6   Smokeless tobacco: Never   Tobacco comments:    Smoking History stopped smoking in 2005. smoked 1 pk/per day x 32 yrs  Vaping Use   Vaping status: Never Used  Substance Use Topics   Alcohol use: No    Alcohol/week: 0.0 standard drinks of alcohol   Drug use: No    ALLERGIES:  is allergic to augmentin [amoxicillin-pot clavulanate] and macrobid [nitrofurantoin macrocrystal].  MEDICATIONS:  Current Outpatient Medications  Medication Sig Dispense  Refill   aspirin 81 MG EC tablet Take by mouth.     atorvastatin (LIPITOR) 20 MG tablet Take 20 mg by mouth daily.     Calcium Carbonate (CALCIUM 600 PO) Take 1 tablet by mouth daily. Dinner     cetirizine (ZYRTEC) 10 MG tablet Take 10 mg by mouth daily. AM     Cholecalciferol (VITAMIN D3) 2000 UNITS TABS Take 1 tablet by mouth daily. Dinner     denosumab (PROLIA) 60 MG/ML SOSY injection Inject 60 mg into the skin every 6 (six) months.     diclofenac Sodium (VOLTAREN) 1 % GEL Apply topically.     gabapentin (NEURONTIN) 300 MG capsule Take 1 capsule (300 mg total) by mouth 3 (three) times daily. 90 capsule 3   Multiple Vitamin (MULTIVITAMIN) tablet Take 1 tablet by mouth daily. Dinner     esomeprazole (NEXIUM) 20 MG capsule Take by mouth.     nicotine polacrilex (NICORETTE) 2 MG gum Take 2 mg by mouth as needed for smoking cessation.     No current facility-administered medications for this visit.    PHYSICAL EXAMINATION: ECOG PERFORMANCE STATUS: 2 - Symptomatic, <50% confined to bed  BP 121/73 (BP Location: Left Arm, Patient Position: Sitting, Cuff Size: Small)   Pulse 83   Temp (!) 96.7 F (35.9 C) (Tympanic)   Ht 5' (1.524 m)   Wt 109 lb 4.8 oz (49.6 kg)   BMI 21.35 kg/m   Filed Weights   06/25/23 1021  Weight: 109 lb 4.8 oz (49.6 kg)     Physical Exam Constitutional:      Comments: In a wheelchair.  Chronic/polio.  Alone.   Thin built; frail appearing.  HENT:     Head: Normocephalic and atraumatic.     Mouth/Throat:     Pharynx: No oropharyngeal exudate.  Eyes:     Pupils: Pupils are equal, round, and reactive to light.  Cardiovascular:     Rate and Rhythm: Normal rate and regular rhythm.  Pulmonary:     Effort: Pulmonary effort is normal. No respiratory distress.     Breath sounds: Normal breath sounds. No wheezing.  Abdominal:     General: Bowel sounds are normal. There is no distension.     Palpations: Abdomen is soft. There is no mass.     Tenderness: There  is no abdominal tenderness. There is no guarding or rebound.  Musculoskeletal:        General: No tenderness. Normal range of motion.     Cervical back: Normal range of motion and neck supple.  Skin:    General: Skin is warm.  Neurological:     Mental Status: She is alert and oriented to person, place, and time.     Comments: Chronic left lower extremity weakness  muscle atrophy-secondary to polio.  Psychiatric:        Mood and Affect: Affect normal.   ;    LABORATORY DATA:  I have reviewed the data as listed    Component Value Date/Time   NA 138 06/25/2023 1022   NA 140 05/15/2013 1038   K 3.6 06/25/2023 1022   K 3.6 05/15/2013 1038   CL 103 06/25/2023 1022   CL 106 05/15/2013 1038   CO2 29 06/25/2023 1022   CO2 29 05/15/2013 1038   GLUCOSE 89 06/25/2023 1022   GLUCOSE 95 05/15/2013 1038   BUN 12 06/25/2023 1022   BUN 7 05/15/2013 1038   CREATININE 0.50 06/25/2023 1022   CREATININE 0.63 05/28/2014 1028   CALCIUM 9.1 06/25/2023 1022   CALCIUM 9.2 05/15/2013 1038   PROT 6.4 (L) 06/25/2023 1022   PROT 6.8 05/28/2014 1028   ALBUMIN 3.3 (L) 06/25/2023 1022   ALBUMIN 3.1 (L) 05/28/2014 1028   AST 20 06/25/2023 1022   ALT 11 06/25/2023 1022   ALT 21 05/28/2014 1028   ALKPHOS 67 06/25/2023 1022   ALKPHOS 98 05/28/2014 1028   BILITOT 0.7 06/25/2023 1022   GFRNONAA >60 06/25/2023 1022   GFRNONAA >60 05/28/2014 1028   GFRAA >60 06/11/2020 1305   GFRAA >60 05/28/2014 1028    No results found for: "SPEP", "UPEP"  Lab Results  Component Value Date   WBC 8.9 06/25/2023   NEUTROABS 4.1 06/25/2023   HGB 11.7 (L) 06/25/2023   HCT 36.8 06/25/2023   MCV 96.3 06/25/2023   PLT 238 06/25/2023      Chemistry      Component Value Date/Time   NA 138 06/25/2023 1022   NA 140 05/15/2013 1038   K 3.6 06/25/2023 1022   K 3.6 05/15/2013 1038   CL 103 06/25/2023 1022   CL 106 05/15/2013 1038   CO2 29 06/25/2023 1022   CO2 29 05/15/2013 1038   BUN 12 06/25/2023 1022   BUN 7  05/15/2013 1038   CREATININE 0.50 06/25/2023 1022   CREATININE 0.63 05/28/2014 1028      Component Value Date/Time   CALCIUM 9.1 06/25/2023 1022   CALCIUM 9.2 05/15/2013 1038   ALKPHOS 67 06/25/2023 1022   ALKPHOS 98 05/28/2014 1028   AST 20 06/25/2023 1022   ALT 11 06/25/2023 1022   ALT 21 05/28/2014 1028   BILITOT 0.7 06/25/2023 1022       RADIOGRAPHIC STUDIES: I have personally reviewed the radiological images as listed and agreed with the findings in the report. No results found.    ASSESSMENT & PLAN:  Lymphocytosis # Mild lymphocytosis; chronic mild-anemia hemoglobin 11-12; normal platelets WBC-9 ; ALC- 5; Gamma delta (GD) T cell lymphocytosis detected, with expression of CD57, a marker of large granular lymphocytes (LGLs); DEC 29th, 2020-CT C/A//P- NED. ALC- WNL- stable.   # Chronic mild anemia: Hemoglobin around 11-12.  Stabl.e   # STAGE II  RIGHT BREAST CA/Lobular cancer-[s/p adjuvant Arimidex-06/2018]; currently on surveillance only. JULY 2024 -Left Uni-Lat mammogram reviewed- stable.    # Neck pain- radiation to right- recommend evaluation with PCP  # Dysphagia-intermittent- chronic [Dr.Toledo; [facility]] EGD/[facility]o-JAN 2021- stable.   # BMD- osteopenia- FEB 2022 -2.4;  stable.  [on prolia since- NOV 2019]; JULY 2024-  a T-score of -2.1 proceed with  prolia- stable.   # DISPO: # proceed with Prolia.  # follow up in 6 months- MD; labs- cbc/cmp; and possible Prolia;  Dr.B    No  orders of the defined types were placed in this encounter.  All questions were answered. The patient knows to call the clinic with any problems, questions or concerns.      Earna Coder, MD 06/25/2023 12:46 PM

## 2023-06-25 NOTE — Progress Notes (Signed)
Was seen at [facility] ED 05/24/23 for chest pain.  Constipation, nothing otc helping.

## 2023-06-25 NOTE — Addendum Note (Signed)
Addended by: Clydia Llano on: 06/25/2023 03:52 PM   Modules accepted: Orders

## 2023-06-25 NOTE — Assessment & Plan Note (Addendum)
#   Mild lymphocytosis; chronic mild-anemia hemoglobin 11-12; normal platelets WBC-9 ; ALC- 5; Gamma delta (GD) T cell lymphocytosis detected, with expression of CD57, a marker of large granular lymphocytes (LGLs); DEC 29th, 2020-CT C/A//P- NED. ALC- WNL- stable.   # Chronic mild anemia: Hemoglobin around 11-12.  Stabl.e   # STAGE II  RIGHT BREAST CA/Lobular cancer-[s/p adjuvant Arimidex-06/2018]; currently on surveillance only. JULY 2024 -Left Uni-Lat mammogram reviewed- stable.    # Neck pain- radiation to right- recommend evaluation with PCP  # Dysphagia-intermittent- chronic [Dr.Toledo; [facility]] EGD/Colo-JAN 2021- stable.   # BMD- osteopenia- FEB 2022 -2.4;  stable.  [on prolia since- NOV 2019]; JULY 2024-  a T-score of -2.1 proceed with  prolia- stable.   # DISPO: # proceed with Prolia.  # follow up in 6 months- MD; labs- cbc/cmp; and possible Prolia;  Dr.B

## 2023-06-25 NOTE — Assessment & Plan Note (Deleted)
#   STAGE II  RIGHT BREAST CA/Lobular cancer-status post adjuvant Arimidex finished September 2019. June 2020- mammogram- WNL;   STABLE; but see below;   # weight loss- 30 pounds/ in last 6 months; ? Etiology recommend CT C/A/P given lymphocytosis/ rising; more fatigue.   # Mild lymphocytosis- WBC-12; ALC- 5; Gamma delta (GD) T cell lymphocytosis detected, with expression of CD57, a  marker of large granular lymphocytes (LGLs). Await above work up.  # BMD- osteopenia- jan 2019-Mild hypocal 8.5; HOLD prolia today.   continue calcium plus vitamin D twice a day.   # DISPO: will with results # CT C/A/P asap-   # Follow up TBD- Dr.B

## 2023-12-20 ENCOUNTER — Telehealth: Payer: Self-pay | Admitting: Internal Medicine

## 2023-12-20 NOTE — Telephone Encounter (Signed)
 Pt husband called and stated that they wanted to cancel up coming lab/MD/prolia injection and will call back to reschedule at a  later date, just [facility]

## 2023-12-23 ENCOUNTER — Ambulatory Visit: Payer: Medicare HMO

## 2023-12-23 ENCOUNTER — Other Ambulatory Visit: Payer: Medicare HMO

## 2023-12-23 ENCOUNTER — Ambulatory Visit: Payer: Medicare HMO | Admitting: Internal Medicine

## 2023-12-27 ENCOUNTER — Encounter: Payer: Self-pay | Admitting: Internal Medicine

## 2024-07-13 ENCOUNTER — Other Ambulatory Visit: Payer: Self-pay | Admitting: Internal Medicine

## 2024-07-13 DIAGNOSIS — Z1231 Encounter for screening mammogram for malignant neoplasm of breast: Secondary | ICD-10-CM

## 2024-08-10 ENCOUNTER — Ambulatory Visit
Admission: RE | Admit: 2024-08-10 | Discharge: 2024-08-10 | Disposition: A | Discharge status: Home / Self Care | Source: Ambulatory Visit | Attending: Internal Medicine | Admitting: Internal Medicine

## 2024-08-10 ENCOUNTER — Other Ambulatory Visit: Payer: Self-pay | Admitting: Internal Medicine

## 2024-08-10 DIAGNOSIS — Z1231 Encounter for screening mammogram for malignant neoplasm of breast: Secondary | ICD-10-CM | POA: Insufficient documentation

## 2024-08-16 ENCOUNTER — Other Ambulatory Visit: Payer: Self-pay | Admitting: Pediatrics

## 2024-08-16 DIAGNOSIS — G14 Postpolio syndrome: Secondary | ICD-10-CM

## 2024-08-16 DIAGNOSIS — Z853 Personal history of malignant neoplasm of breast: Secondary | ICD-10-CM

## 2024-08-16 DIAGNOSIS — K6289 Other specified diseases of anus and rectum: Secondary | ICD-10-CM

## 2024-08-18 ENCOUNTER — Ambulatory Visit
Admission: RE | Admit: 2024-08-18 | Discharge: 2024-08-18 | Disposition: A | Discharge status: Home / Self Care | Source: Ambulatory Visit | Attending: Pediatrics | Admitting: Pediatrics

## 2024-08-18 DIAGNOSIS — Z853 Personal history of malignant neoplasm of breast: Secondary | ICD-10-CM | POA: Insufficient documentation

## 2024-08-18 DIAGNOSIS — G14 Postpolio syndrome: Secondary | ICD-10-CM | POA: Diagnosis present

## 2024-08-18 DIAGNOSIS — K6289 Other specified diseases of anus and rectum: Secondary | ICD-10-CM | POA: Diagnosis present

## 2024-09-11 NOTE — Progress Notes (Addendum)
 Chief Complaint:   Chief Complaint  Patient presents with   Hyperlipidemia   B12 Injection    Subjective  Erika Miller is a 78 y.o. female in today for: History of Present Illness Erika Miller is an 78 year old female who presents with bladder issues and nutritional concerns.  She has been experiencing difficulty holding her bladder intermittently. She previously took a medication for her bladder, which was expensive, and only used it for thirty days. No recent flare-ups. N Improved control over bladder and bowels recently. Bowel continence improved once her loose stools ( post antibiotic) resolved.  An MRI of her back showed arthritis.  I offered her a referral to pelvic PT but she declines at this time.  Her weight has fluctuated, with recent measurements showing a decrease to 105 pounds. Her daughter notes that she has been eating tomato sandwiches for lunch. Daughter feeds the parents routinely as supplementation to their own cooking. She occasionally indulges in snacks like peanut butter cookies to maintain her weight.  Her past medical history includes taking atorvastatin, gabapentin , and receiving B12 shots. Her blood work from the spring showed good B12 levels and kidney function, though her protein levels were slightly low.  Family report her memory challenges continue with her 'seeing' her loved ones at their home and thinking her deceased mom is still in a nursing home.   HPI   Patient Active Problem List  Diagnosis   Post-polio syndrome (CMS-HCC)   Allergic rhinitis   GERD (gastroesophageal reflux disease)   Vitamin D  deficiency   Elevated white blood cell count/ abnormal CBC   Pure hypercholesterolemia- mild   Mammographic microcalcification   Osteopenia   History of tobacco abuse   Screening for malignant neoplasm of respiratory organ   ASCVD (arteriosclerotic cardiovascular disease)   Premature ventricular contractions   Neurocardiogenic  pre-syncope   Mild anemia   Thoracogenic scoliosis of thoracolumbar region   B12 deficiency   Aromatase inhibitor use   Family history of breast cancer   Family history of ovarian cancer   Hx of breast cancer, right hx of mastectomy with chemo   Mixed Alzheimer's and vascular dementia (CMS-HCC)   Mixed stress and urge urinary incontinence   Dysuria   Spinal stenosis of lumbar region at multiple levels   Spondylosis of lumbosacral region without myelopathy or radiculopathy    Outpatient Medications Prior to Visit  Medication Sig Dispense Refill   atorvastatin (LIPITOR) 20 MG tablet TAKE 1 TABLET BY MOUTH THREE TIMES A WEEK 36 tablet 3   gabapentin  (NEURONTIN ) 300 MG capsule TAKE 1 CAPSULE BY MOUTH THREE TIMES DAILY 90 capsule 3   memantine (NAMENDA) 5 MG tablet Take 1 tablet (5 mg total) by mouth once daily 90 tablet 3   Facility-Administered Medications Prior to Visit  Medication Dose Route Frequency Provider Last Rate Last Admin   cyanocobalamin (VITAMIN B12) injection 1,000 mcg  1,000 mcg Intramuscular Q30 Days Jyl Heron Haff, MD   1,000 mcg at 08/07/24 0955     Objective  Vitals:   09/11/24 1056  BP: 114/56  Pulse: 75  Weight: 47.6 kg (105 lb)  PainSc: 0-No pain   Body mass index is 21.21 kg/m. Home Vitals:     Physical Exam  Physical Exam Constitutional: alert, in NAD, and communicates well Eye exam: pupils equal and reactive, extraocular eye movements intact. Neck: supple and no bruits heard Respiratory: clear to auscultation, without rales or wheezes  Cardiovascular: regular rate and rhythm  and without murmurs, rubs or gallops Lower extremities: no lower extremity edema Skin ankles/feet: warm, good capillary refill and no ulcerations or lesions noted   Results RADIOLOGY Back MRI: Arthritis without spinal cord compression No compression fx.  No results found for this visit on 09/11/24.     Assessment/Plan:   Assessment &  Plan Urinary incontinence and weak pelvic muscles   Urinary incontinence is likely due to weak pelvic muscles. Previous MRI showed arthritis but no spinal cord compression. Current management has led to improvement. Previous medication was not continued due to cost. Discussed potential referral to urogynecology for muscle tightening and physical therapy for pelvic floor exercises. Gemtesa was discussed, with side effects including constipation, dry mouth, lightheadedness, and dizziness. Cheaper alternatives have more side effects. Prescribed Gemtesa,vibegron 75 mg Tab,  to be used as needed. Will consider referral to  physical therapy for pelvic floor exercises if she is willing. Pt likely will d/c the urogyn referral since her symptoms are so much better.  Arthritis of the spine  - -Chronic controlled, no changes. Continue current medical regime. Chronic arthritis of the spine is confirmed by MRI, with no spinal cord compression. Symptoms are managed with the current treatment plan.  Protein-calorie malnutrition   Low protein intake was noted in previous blood work. Current weight is 105 lbs, with fluctuations. She is encouraged to increase protein intake to prevent further weight loss, such as adding meat to meals, and to try new foods like Nuttie Butters to increase caloric intake.  B12 deficiency-  -Chronic controlled, no changes. Continue current medical regime. B12 levels are adequate with current supplementation. Administered B12 injection today. No repeat blood work unless new issues arise.  No need for repeat blood work unless new issues arise. She received a flu shot. COVID vaccination was not pursued by pt.   Memory changes and changes to ADL (bathing) - per neurology. Daughter big assist.  There are no diagnoses linked to this encounter.  This visit was coded based on medical decision making (MDM).           Future Appointments     Date/Time Provider Department Center Visit  Type   07/16/2025 10:15 AM Maree Jannett Hering, MD [facility] C RETURN VISIT       There are no Patient Instructions on file for this visit.  An after visit summary was provided for the patient either in written format (printed) or through [facility].  This note has been created using automated tools and reviewed for accuracy by BARBARA D ALDRIDGE.

## 2024-10-17 NOTE — Progress Notes (Signed)
 Family requesting HH. Home health request eval for nursing for med mgt, PT/OT and aid.
